# Patient Record
Sex: Male | Born: 1950 | Race: White | Hispanic: No | Marital: Married | Smoking: Never smoker
Health system: Southern US, Community
[De-identification: ages and names within clinical notes are randomized; demographics above are authoritative.]

## PROBLEM LIST (undated history)

## (undated) ENCOUNTER — Emergency Department (HOSPITAL_BASED_OUTPATIENT_CLINIC_OR_DEPARTMENT_OTHER): Admission: EM | Payer: Medicare Other

## (undated) DIAGNOSIS — I700 Atherosclerosis of aorta: Secondary | ICD-10-CM

## (undated) DIAGNOSIS — M1990 Unspecified osteoarthritis, unspecified site: Secondary | ICD-10-CM

## (undated) DIAGNOSIS — I509 Heart failure, unspecified: Secondary | ICD-10-CM

## (undated) DIAGNOSIS — I2510 Atherosclerotic heart disease of native coronary artery without angina pectoris: Secondary | ICD-10-CM

## (undated) DIAGNOSIS — G4730 Sleep apnea, unspecified: Secondary | ICD-10-CM

## (undated) DIAGNOSIS — T7840XA Allergy, unspecified, initial encounter: Secondary | ICD-10-CM

## (undated) DIAGNOSIS — I4891 Unspecified atrial fibrillation: Secondary | ICD-10-CM

## (undated) DIAGNOSIS — K7460 Unspecified cirrhosis of liver: Secondary | ICD-10-CM

## (undated) DIAGNOSIS — J45909 Unspecified asthma, uncomplicated: Secondary | ICD-10-CM

## (undated) DIAGNOSIS — I10 Essential (primary) hypertension: Secondary | ICD-10-CM

## (undated) DIAGNOSIS — J449 Chronic obstructive pulmonary disease, unspecified: Secondary | ICD-10-CM

## (undated) DIAGNOSIS — B958 Unspecified staphylococcus as the cause of diseases classified elsewhere: Secondary | ICD-10-CM

## (undated) DIAGNOSIS — E785 Hyperlipidemia, unspecified: Secondary | ICD-10-CM

## (undated) DIAGNOSIS — E118 Type 2 diabetes mellitus with unspecified complications: Secondary | ICD-10-CM

## (undated) HISTORY — DX: Unspecified cirrhosis of liver: K74.60

## (undated) HISTORY — DX: Unspecified asthma, uncomplicated: J45.909

## (undated) HISTORY — DX: Unspecified atrial fibrillation: I48.91

## (undated) HISTORY — DX: Hyperlipidemia, unspecified: E78.5

## (undated) HISTORY — DX: Chronic obstructive pulmonary disease, unspecified: J44.9

## (undated) HISTORY — DX: Type 2 diabetes mellitus with unspecified complications: E11.8

## (undated) HISTORY — DX: Essential (primary) hypertension: I10

## (undated) HISTORY — PX: EYE SURGERY: SHX253

## (undated) HISTORY — DX: Unspecified osteoarthritis, unspecified site: M19.90

## (undated) HISTORY — DX: Allergy, unspecified, initial encounter: T78.40XA

## (undated) HISTORY — PX: PALATE / UVULA BIOPSY / EXCISION: SUR128

## (undated) HISTORY — DX: Heart failure, unspecified: I50.9

## (undated) HISTORY — DX: Unspecified staphylococcus as the cause of diseases classified elsewhere: B95.8

## (undated) HISTORY — DX: Sleep apnea, unspecified: G47.30

---
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
## 2017-08-14 ENCOUNTER — Ambulatory Visit (INDEPENDENT_AMBULATORY_CARE_PROVIDER_SITE_OTHER): Payer: Medicare Other | Admitting: Family Medicine

## 2017-08-14 ENCOUNTER — Encounter: Payer: Self-pay | Admitting: Family Medicine

## 2017-08-14 VITALS — BP 130/78 | HR 62 | Temp 98.9°F | Resp 16 | Ht 69.0 in | Wt 186.2 lb

## 2017-08-14 DIAGNOSIS — R05 Cough: Secondary | ICD-10-CM | POA: Diagnosis not present

## 2017-08-14 DIAGNOSIS — Z8701 Personal history of pneumonia (recurrent): Secondary | ICD-10-CM | POA: Diagnosis not present

## 2017-08-14 DIAGNOSIS — Z79899 Other long term (current) drug therapy: Secondary | ICD-10-CM

## 2017-08-14 DIAGNOSIS — I4819 Other persistent atrial fibrillation: Secondary | ICD-10-CM

## 2017-08-14 DIAGNOSIS — R0602 Shortness of breath: Secondary | ICD-10-CM | POA: Diagnosis not present

## 2017-08-14 DIAGNOSIS — I509 Heart failure, unspecified: Secondary | ICD-10-CM | POA: Diagnosis not present

## 2017-08-14 DIAGNOSIS — Z7901 Long term (current) use of anticoagulants: Secondary | ICD-10-CM

## 2017-08-14 DIAGNOSIS — I481 Persistent atrial fibrillation: Secondary | ICD-10-CM | POA: Diagnosis not present

## 2017-08-14 DIAGNOSIS — R911 Solitary pulmonary nodule: Secondary | ICD-10-CM | POA: Diagnosis not present

## 2017-08-14 DIAGNOSIS — R058 Other specified cough: Secondary | ICD-10-CM

## 2017-08-14 LAB — COMPREHENSIVE METABOLIC PANEL
AG Ratio: 1.4 (calc) (ref 1.0–2.5)
ALKALINE PHOSPHATASE (APISO): 36 U/L — AB (ref 40–115)
ALT: 32 U/L (ref 9–46)
AST: 29 U/L (ref 10–35)
Albumin: 4.7 g/dL (ref 3.6–5.1)
BILIRUBIN TOTAL: 0.8 mg/dL (ref 0.2–1.2)
BUN/Creatinine Ratio: 15 (calc) (ref 6–22)
BUN: 20 mg/dL (ref 7–25)
CALCIUM: 10.1 mg/dL (ref 8.6–10.3)
CO2: 34 mmol/L — ABNORMAL HIGH (ref 20–32)
CREATININE: 1.36 mg/dL — AB (ref 0.70–1.25)
Chloride: 98 mmol/L (ref 98–110)
Globulin: 3.3 g/dL (calc) (ref 1.9–3.7)
Glucose, Bld: 190 mg/dL — ABNORMAL HIGH (ref 65–99)
Potassium: 4.6 mmol/L (ref 3.5–5.3)
SODIUM: 139 mmol/L (ref 135–146)
TOTAL PROTEIN: 8 g/dL (ref 6.1–8.1)

## 2017-08-14 LAB — CBC WITH DIFFERENTIAL/PLATELET
BASOS ABS: 38 {cells}/uL (ref 0–200)
Basophils Relative: 0.6 %
EOS PCT: 12.6 %
Eosinophils Absolute: 806 cells/uL — ABNORMAL HIGH (ref 15–500)
HEMATOCRIT: 46.9 % (ref 38.5–50.0)
Hemoglobin: 16.3 g/dL (ref 13.2–17.1)
LYMPHS ABS: 1811 {cells}/uL (ref 850–3900)
MCH: 30.4 pg (ref 27.0–33.0)
MCHC: 34.8 g/dL (ref 32.0–36.0)
MCV: 87.5 fL (ref 80.0–100.0)
MONOS PCT: 7.8 %
MPV: 10.2 fL (ref 7.5–12.5)
NEUTROS PCT: 50.7 %
Neutro Abs: 3245 cells/uL (ref 1500–7800)
PLATELETS: 183 10*3/uL (ref 140–400)
RBC: 5.36 10*6/uL (ref 4.20–5.80)
RDW: 12.8 % (ref 11.0–15.0)
TOTAL LYMPHOCYTE: 28.3 %
WBC mixed population: 499 cells/uL (ref 200–950)
WBC: 6.4 10*3/uL (ref 3.8–10.8)

## 2017-08-14 MED ORDER — BUDESONIDE-FORMOTEROL FUMARATE 80-4.5 MCG/ACT IN AERO: 2.0000 | INHALATION_SPRAY | Freq: Two times a day (BID) | RESPIRATORY_TRACT | 3 refills | Status: DC

## 2017-08-14 MED ORDER — LEVOFLOXACIN 500 MG PO TABS: 500.0000 mg | ORAL_TABLET | Freq: Every day | ORAL | 0 refills | Status: DC

## 2017-08-14 NOTE — Patient Instructions (Signed)
Start the antibiotic and symbicort as prescribed.   Take 5 mg of warfarin daily instead of alternating dose and return Friday for a lab visit for a PT/INR. As discussed, the antibiotic may increase your risk of bleeding with the warfarin and we need to keep a close eye on this.   Get your chest XR tomorrow at [facility].   We will call you with your results.   If you get worse, fever, worsening shortness of breath, chest pain, etc, then go to the emergency department or call 911.

## 2017-08-14 NOTE — Progress Notes (Signed)
Subjective:    Patient ID: Theodore Bush, male    DOB: 1951-03-15, 66 y.o.   MRN: [id number]  HPI Chief Complaint  Patient presents with  . New Patient (Initial Visit)    new pt, having trouble breathing, asthma    He is new to the practice and here visiting family. Here for an acute visit. Complains of a 1 week history of shortness of breath and productive cough,. Reports history of CHF, A-fib, asthma, diabetes type 2 and arthritis/chronic pain.  States he was diagnosed with pneumonia last month in his hometown of [place] and was treated with a course of antibiotics and steroids. He does not know which antibiotic he took. Reports feeling much better after treatment. He thinks he completed the treatment 3 weeks ago. States symptoms never completely resolved. 1 week ago he started back having shortness of breath, wheezing and productive cough. Denies frothy sputum or hemoptysis.  Apparently he was diagnosed with asthma in his teens and has been well controlled on rescue inhaler alone for many years. States he was using his rescue inhaler 2-3 times per week for several months prior to becoming ill last month.  States he has been using proair inhaler every other hour over the past 2 days.  States he has an albuterol nebulizer at home but has not used this.   Denies fever, chills, dizziness, rhinorrhea, nasal congestion, sore throat, chest pain, abdominal pain, N/V/D. Denies LE edema.  States he does not feel like his CHF is flaring up. States he knows what a CHF exacerbation feels like.   Reports being diagnosed with A-fib 2-3 years ago. Has been on chronic anticoagulation. Reports alternating 5 mg and 7.5 mg doses of warfarin. States he has been on this regimen for 2-3 years.   History of chronic pain and sees a pain specialist in [place].   Psoriatic arthritis.  Diabetes type 2.   Plans to relocate here from [place] at some point. 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   Denies alcohol  or drug use. Does not smoke. Chews tobacco.   Reviewed allergies, medications, past medical, surgical, and social history.   Review of Systems Pertinent positives and negatives in the history of present illness.     Objective:   Physical Exam  Constitutional: He is oriented to person, place, and time. He appears well-developed and well-nourished. He does not have a sickly appearance. No distress.  Speaking in complete sentences  HENT:  Mouth/Throat: Uvula is midline, oropharynx is clear and moist and mucous membranes are normal.  Eyes: Conjunctivae and lids are normal. Pupils are equal, round, and reactive to light.  Neck: Full passive range of motion without pain. Neck supple. No JVD present.  Cardiovascular: Normal rate and normal pulses. An irregular rhythm present.  No LE edema   Pulmonary/Chest: Effort normal. No accessory muscle usage. No respiratory distress. He has wheezes. He has rhonchi.  Scattered rhonchi and expiratory wheezes throughout  Normal work of breathing  Lymphadenopathy:    He has no cervical adenopathy.       Right: No supraclavicular adenopathy present.       Left: No supraclavicular adenopathy present.  Neurological: He is alert and oriented to person, place, and time. No cranial nerve deficit or sensory deficit. Gait normal.  Skin: Skin is warm and dry. No rash noted. He is not diaphoretic. No cyanosis. No pallor.  Psychiatric: He has a normal mood and affect. His speech is normal and behavior  is normal. Thought content normal. Cognition and memory are normal.   BP 130/78   Pulse 62   Temp 98.9 F (37.2 C)   Resp 16   Ht 5\' 9"  (1.753 m)   Wt 186 lb 3.2 oz (84.5 kg)   SpO2 95%   BMI 27.50 kg/m       Assessment & Plan:  Shortness of breath - Plan: CBC with Differential/Platelet, Comprehensive metabolic panel, DG Chest 2 View, Brain natriuretic peptide, levofloxacin (LEVAQUIN) 500 MG tablet  Persistent atrial fibrillation (HCC)  Chronic  anticoagulation - Plan: Protime-INR  History of pneumonia - Plan: DG Chest 2 View, levofloxacin (LEVAQUIN) 500 MG tablet  Right lower lobe pulmonary nodule - Plan: DG Chest 2 View  Productive cough - Plan: CBC with Differential/Platelet, Comprehensive metabolic panel, DG Chest 2 View, levofloxacin (LEVAQUIN) 500 MG tablet  Chronic congestive heart failure, unspecified heart failure type (HCC) - Plan: CBC with Differential/Platelet, Comprehensive metabolic panel, DG Chest 2 View, Brain natriuretic peptide  Medication management - Plan: Protime-INR  Discussed that he is not in any distress or danger at this point. Hemodynamically stable and no sign of fluid overload.  Levoquin sent to pharmacy.  History of uncontrolled underlying asthma and no maintenance meds in the past.  symbicort 80/4.5 sample given. He is aware that the goal is to cut back on albuterol inhaler need and use.  Chest XR and labs ordered including CBC, CMP, BNP  We will follow up with results.  Cut back warfarin from alternating doses 7.5mg /5mg  to 5 mg daily starting on 08/14/2017 and follow up on Friday 08/17/2017 for a stat PT/INR. Order is in the computer. Patient is returning to his hometown next week. He will need to be seen by his PCP once he returns.  Spent a minimum of 45 minutes face to face with patient and at least 50% of time was in counseling and coordination of care.  Dr. Redmond School also examined patient and agrees with plan of care.

## 2017-08-15 ENCOUNTER — Encounter: Payer: Self-pay | Admitting: Family Medicine

## 2017-08-15 ENCOUNTER — Ambulatory Visit
Admission: RE | Admit: 2017-08-15 | Discharge: 2017-08-15 | Disposition: A | Discharge status: Home / Self Care | Payer: Medicare Other | Source: Ambulatory Visit | Attending: Family Medicine | Admitting: Family Medicine

## 2017-08-15 DIAGNOSIS — R0602 Shortness of breath: Secondary | ICD-10-CM

## 2017-08-15 DIAGNOSIS — R911 Solitary pulmonary nodule: Secondary | ICD-10-CM

## 2017-08-15 DIAGNOSIS — R05 Cough: Secondary | ICD-10-CM

## 2017-08-15 DIAGNOSIS — R058 Other specified cough: Secondary | ICD-10-CM

## 2017-08-15 DIAGNOSIS — Z8701 Personal history of pneumonia (recurrent): Secondary | ICD-10-CM

## 2017-08-15 DIAGNOSIS — I509 Heart failure, unspecified: Secondary | ICD-10-CM

## 2017-08-15 LAB — BRAIN NATRIURETIC PEPTIDE: Brain Natriuretic Peptide: 96 pg/mL (ref <100)

## 2017-08-17 ENCOUNTER — Ambulatory Visit: Payer: Medicare Other | Admitting: Medical

## 2017-08-17 ENCOUNTER — Telehealth: Payer: Self-pay

## 2017-08-17 DIAGNOSIS — Z79899 Other long term (current) drug therapy: Secondary | ICD-10-CM

## 2017-08-17 DIAGNOSIS — Z7901 Long term (current) use of anticoagulants: Secondary | ICD-10-CM

## 2017-08-17 LAB — PROTIME-INR
INR: 2 — ABNORMAL HIGH
Prothrombin Time: 21.4 s — ABNORMAL HIGH (ref 9.0–11.5)

## 2017-08-17 NOTE — Telephone Encounter (Signed)
Theodore Bush received  Pt /inr back on pt ., per Theodore Bush pt is to stay on the same dose.  5mg   Pt was notified of this .

## 2017-09-03 ENCOUNTER — Telehealth: Payer: Self-pay

## 2017-09-03 MED ORDER — BUDESONIDE-FORMOTEROL FUMARATE 80-4.5 MCG/ACT IN AERO: 2.0000 | INHALATION_SPRAY | Freq: Two times a day (BID) | RESPIRATORY_TRACT | 2 refills | Status: DC

## 2017-09-03 NOTE — Telephone Encounter (Signed)
Pt called requesting Symbicort refills to be transfer from [facility] in [place] to [place] in [place]. Symbicort called to [facility]. Victorino December

## 2017-09-18 DIAGNOSIS — J189 Pneumonia, unspecified organism: Secondary | ICD-10-CM

## 2017-09-18 HISTORY — DX: Pneumonia, unspecified organism: J18.9

## 2018-03-07 ENCOUNTER — Encounter: Payer: Self-pay | Admitting: Family Medicine

## 2018-03-07 ENCOUNTER — Ambulatory Visit (INDEPENDENT_AMBULATORY_CARE_PROVIDER_SITE_OTHER): Payer: Medicare Other | Admitting: Family Medicine

## 2018-03-07 VITALS — BP 102/68 | HR 41 | Temp 97.7°F | Resp 18 | Ht 70.0 in | Wt 190.6 lb

## 2018-03-07 DIAGNOSIS — Z7901 Long term (current) use of anticoagulants: Secondary | ICD-10-CM | POA: Diagnosis not present

## 2018-03-07 DIAGNOSIS — R053 Chronic cough: Secondary | ICD-10-CM

## 2018-03-07 DIAGNOSIS — G4700 Insomnia, unspecified: Secondary | ICD-10-CM | POA: Diagnosis not present

## 2018-03-07 DIAGNOSIS — Z79899 Other long term (current) drug therapy: Secondary | ICD-10-CM

## 2018-03-07 DIAGNOSIS — R05 Cough: Secondary | ICD-10-CM | POA: Diagnosis not present

## 2018-03-07 DIAGNOSIS — R413 Other amnesia: Secondary | ICD-10-CM | POA: Diagnosis not present

## 2018-03-07 DIAGNOSIS — I481 Persistent atrial fibrillation: Secondary | ICD-10-CM | POA: Diagnosis not present

## 2018-03-07 DIAGNOSIS — I4819 Other persistent atrial fibrillation: Secondary | ICD-10-CM

## 2018-03-07 MED ORDER — RIVAROXABAN 20 MG PO TABS: 20.0000 mg | ORAL_TABLET | Freq: Every day | ORAL | 3 refills | Status: DC

## 2018-03-07 NOTE — Patient Instructions (Signed)

## 2018-03-07 NOTE — Progress Notes (Signed)
   Subjective:    Patient ID: Norma Ignasiak, male    DOB: 1951/08/02, 67 y.o.   MRN: [id number]  HPI He is here for multiple issues.  He was originally scheduled as an acute visit for cough.  He has a long history of difficulty with cough mainly in the morning.  He presently does not smoke however does use marijuana mainly at night to help with sleep.  He is a retired Building control surveyor.  He gives a history of having chest x-ray as well as MRI scan on his chest within the last several years.  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 He also has had difficulty with his sleep and has gotten used to the idea of using marijuana to help relax him to sleep.  There are also questions about his memory.  He has no particular concerns about it but apparently other people in his family have.   He is in the process of moving to this area from [place].  He does have a physician there. He has a long history of atrial fibrillation and is presently on Coumadin.  He does find that that problematic.  Review of Systems     Objective:   Physical Exam Alert and in no distress. MMSE 30       Assessment & Plan:  Chronic anticoagulation - Plan: rivaroxaban (XARELTO) 20 MG TABS tablet  Persistent atrial fibrillation (HCC) - Plan: rivaroxaban (XARELTO) 20 MG TABS tablet  Medication management  Insomnia, unspecified type  Chronic cough  Memory difficulties I discussed the fact that the memory issues do not seem to be a major player at the present time.  Discussed the atrial fib with him and encouraged him to switch to Xarelto which he was comfortable with. I then spent time talking to him about insomnia.  Did give him information concerning that and encouraged him to stay away from marijuana as much as possible. He has other health related issues in regard to his diabetes as well as psoriatic arthritis.  Recommend that when he comes back into this  area to live permanently, we can readdress these issues. Over 45 minutes, greater than 50% spent in counseling and coordination of care.

## 2018-03-27 ENCOUNTER — Telehealth: Payer: Self-pay | Admitting: Family Medicine

## 2018-03-27 NOTE — Telephone Encounter (Signed)
Received records from pt VIA mail. Sending back for review.

## 2018-09-02 ENCOUNTER — Encounter: Payer: Self-pay | Admitting: Family Medicine

## 2018-09-02 ENCOUNTER — Ambulatory Visit (INDEPENDENT_AMBULATORY_CARE_PROVIDER_SITE_OTHER): Payer: Medicare Other | Admitting: Family Medicine

## 2018-09-02 ENCOUNTER — Encounter: Payer: Medicare Other | Admitting: Family Medicine

## 2018-09-02 VITALS — BP 122/62 | HR 51 | Wt 194.4 lb

## 2018-09-02 DIAGNOSIS — Z111 Encounter for screening for respiratory tuberculosis: Secondary | ICD-10-CM

## 2018-09-02 DIAGNOSIS — M069 Rheumatoid arthritis, unspecified: Secondary | ICD-10-CM

## 2018-09-02 DIAGNOSIS — I4891 Unspecified atrial fibrillation: Secondary | ICD-10-CM | POA: Diagnosis not present

## 2018-09-02 DIAGNOSIS — J4530 Mild persistent asthma, uncomplicated: Secondary | ICD-10-CM | POA: Insufficient documentation

## 2018-09-02 DIAGNOSIS — Z7901 Long term (current) use of anticoagulants: Secondary | ICD-10-CM | POA: Insufficient documentation

## 2018-09-02 DIAGNOSIS — Z794 Long term (current) use of insulin: Secondary | ICD-10-CM | POA: Insufficient documentation

## 2018-09-02 DIAGNOSIS — E118 Type 2 diabetes mellitus with unspecified complications: Secondary | ICD-10-CM

## 2018-09-02 DIAGNOSIS — I509 Heart failure, unspecified: Secondary | ICD-10-CM | POA: Insufficient documentation

## 2018-09-02 HISTORY — DX: Type 2 diabetes mellitus with unspecified complications: E11.8

## 2018-09-02 HISTORY — DX: Unspecified atrial fibrillation: I48.91

## 2018-09-02 NOTE — Progress Notes (Signed)
   Subjective:    Patient ID: Theodore Bush, male    DOB: [birth year], 67 y.o.   MRN: [id number]  HPI Chief Complaint  Patient presents with  . med check    med check wants TB skin test for humira-, dixgoin   He is here requesting a TB test that he reports he gets annually since being on Humira for psoriatic arthritis.  States his rheumatologist in [place] typically does this.  States he is in town visiting his children and grandchildren.  He currently still lives in [place] but states he hopes to move here once he sells his house. He has several providers managing his care in [place].  Dr. Assunta Curtis is his PCP in [facility] is managing his health. He saw him the end of October. States he does not need refills.   Diabetes is being managed by endocrinology. States his last A1c had improved but still above 7%. No issues with medications.   He also reports having a pain management specialist, rheumatologist. Has not followed up with a cardiologist in the past couple of years but plans to establish with one when moving here.   States he is doing well on Xarelto for Afib. No sign of bleeding.   Denies fever, chills, dizziness, chest pain, palpitations, shortness of breath, abdominal pain, N/V/D, urinary symptoms, LE edema.   Reviewed allergies, medications, past medical, surgical, family, and social history.    Review of Systems Pertinent positives and negatives in the history of present illness.     Objective:   Physical Exam BP 122/62   Pulse (!) 51   Wt 194 lb 6.4 oz (88.2 kg)   BMI 27.89 kg/m   Alert and oriented and in no acute distress.  Normal work of breathing.  Skin is warm and dry.  Speech, mood normal.      Assessment & Plan:  Screening-pulmonary TB - Plan: QuantiFERON-TB Gold Plus  Controlled diabetes mellitus type 2 with complications, unspecified whether long term insulin use (HCC)  Atrial fibrillation, unspecified type (HCC)  Rheumatoid  arthritis, involving unspecified site, unspecified rheumatoid factor presence (HCC)  Chronic anticoagulation  Chronic congestive heart failure, unspecified heart failure type ([place])  Mild persistent asthma without complication  He is currently visiting his children and grandchildren in town and here for a TB screening test.  States he gets this annually since being on Humira.  Typically this is done by his rheumatologist in [place]. He has a complex medical health history.  He is on several high-risk medications.  Reports good compliance with medications and with his health care visits to multiple providers in [place]. He is not in any distress.  Discussed that once he moves here that I will recommend that he establish with a cardiologist, rheumatologist and potentially an endocrinologist as well.  He questions whether the rheumatologist will prescribe his chronic pain medication and I did recommend that he also establish with a pain management specialist once moving here.  I advised him to continue seeing his providers in [place] as scheduled and requesting that they refill his medications prior to the move. Dr. Crisoforo Oxford is his rheumatologist in [facility]. Requests that his TB- Quant Gold result be sent to her.  Advised him to fill out a medical release so that I can forward the results to his provider. Currently no follow-up recommended

## 2018-09-02 NOTE — Patient Instructions (Addendum)
[facility] Address: [address], [place], [place] [address] Phone: [phone]   [facility] Phone: [phone] Address: [address] [place], [place]

## 2018-09-06 ENCOUNTER — Telehealth: Payer: Self-pay | Admitting: Family Medicine

## 2018-09-06 NOTE — Telephone Encounter (Signed)
Requested records received from Dr. Sophronia Simas. Sending back for review.

## 2018-09-09 LAB — QUANTIFERON-TB GOLD PLUS
QUANTIFERON NIL VALUE: 0.05 [IU]/mL
QuantiFERON Mitogen Value: 4.16 IU/mL
QuantiFERON TB1 Ag Value: 0.05 IU/mL
QuantiFERON TB2 Ag Value: 0.05 IU/mL
QuantiFERON-TB Gold Plus: NEGATIVE

## 2018-10-22 DIAGNOSIS — I10 Essential (primary) hypertension: Secondary | ICD-10-CM | POA: Insufficient documentation

## 2018-11-21 ENCOUNTER — Other Ambulatory Visit: Payer: Self-pay | Admitting: Family Medicine

## 2018-11-21 DIAGNOSIS — I4819 Other persistent atrial fibrillation: Secondary | ICD-10-CM

## 2018-11-21 DIAGNOSIS — Z7901 Long term (current) use of anticoagulants: Secondary | ICD-10-CM

## 2018-11-21 NOTE — Telephone Encounter (Signed)
Is this okay to refill? 

## 2019-01-28 ENCOUNTER — Other Ambulatory Visit: Payer: Self-pay

## 2019-01-28 ENCOUNTER — Encounter: Payer: Self-pay | Admitting: Medical

## 2019-01-28 ENCOUNTER — Ambulatory Visit (INDEPENDENT_AMBULATORY_CARE_PROVIDER_SITE_OTHER): Payer: Medicare Other | Admitting: Medical

## 2019-01-28 VITALS — Temp 98.3°F | Ht 69.0 in | Wt 200.0 lb

## 2019-01-28 DIAGNOSIS — E118 Type 2 diabetes mellitus with unspecified complications: Secondary | ICD-10-CM

## 2019-01-28 DIAGNOSIS — M069 Rheumatoid arthritis, unspecified: Secondary | ICD-10-CM | POA: Diagnosis not present

## 2019-01-28 DIAGNOSIS — J4540 Moderate persistent asthma, uncomplicated: Secondary | ICD-10-CM | POA: Diagnosis not present

## 2019-01-28 DIAGNOSIS — I4891 Unspecified atrial fibrillation: Secondary | ICD-10-CM

## 2019-01-28 DIAGNOSIS — I10 Essential (primary) hypertension: Secondary | ICD-10-CM

## 2019-01-28 DIAGNOSIS — E782 Mixed hyperlipidemia: Secondary | ICD-10-CM

## 2019-01-28 DIAGNOSIS — Z7901 Long term (current) use of anticoagulants: Secondary | ICD-10-CM

## 2019-01-28 MED ORDER — BUDESONIDE-FORMOTEROL FUMARATE 160-4.5 MCG/ACT IN AERO: 2.0000 | INHALATION_SPRAY | Freq: Two times a day (BID) | RESPIRATORY_TRACT | 2 refills | Status: DC

## 2019-01-28 MED ORDER — ALBUTEROL SULFATE HFA 108 (90 BASE) MCG/ACT IN AERS: 1.0000 | INHALATION_SPRAY | Freq: Four times a day (QID) | RESPIRATORY_TRACT | 1 refills | Status: DC | PRN

## 2019-01-28 NOTE — Progress Notes (Signed)
Sent referal to [facility] and pt is scheduled August

## 2019-01-28 NOTE — Progress Notes (Signed)
Subjective:     Patient ID: Keyondre Hepburn, male   DOB: 10/12/1950, 68 y.o.   MRN: [id number]  This visit type was conducted due to national recommendations for restrictions regarding the COVID-19 Pandemic (e.g. social distancing) in an effort to limit this patient's exposure and mitigate transmission in our community.  Due to their co-morbid illnesses, this patient is at least at moderate risk for complications without adequate follow up.  This format is felt to be most appropriate for this patient at this time.    Documentation for virtual audio and video telecommunications through Zoom encounter:  The patient was located at home. The provider was located in the office. The patient did consent to this visit and is aware of possible charges through their insurance for this visit.  The other persons participating in this telemedicine service were none. Time spent on call was 15 minutes and in review of previous records >20 minutes total.  This virtual service is not related to other E/M service within previous 7 days.   HPI Chief Complaint  Patient presents with  . breathing issues    breathing issues,  needs symbicort refill    Virtual visit today for medication concern.  He just moved up from [place] to live here with his son.  He is currently staying with his son until they find a place to live.  He apparently did not have all of his med list the last time he called in so he wants to make sure we have the accurate medications on file.  He is a diabetic and just saw his diabetic doctor in [place] recently and had a hemoglobin A1c of 6.8%.  His blood sugars range 90-120 fasting.  He would like to get off the medication  He needs a referral to a rheumatologist here in [place].  He has rheumatoid arthritis and his rheumatologist in [place] was prescribing his Norco and Humira  Asthma-his main concern today is he is out of his Symbicort.  He needs that refilled and he is on his last  albuterol inhaler.  Otherwise has been in his usual state of health  Review of Systems As in subjective    Objective:   Physical Exam  Temp 98.3 F (36.8 C) (Oral)   Ht 5\' 9"  (1.753 m)   Wt 200 lb (90.7 kg)   BMI 29.53 kg/m   Due to coronavirus pandemic stay at home measures, patient visit was virtual and they were not examined in person.   General: Well-developed, well-nourished, no acute distress     Assessment:     Encounter Diagnoses  Name Primary?  . Moderate persistent asthma, unspecified whether complicated Yes  . Atrial fibrillation, unspecified type ([place])   . Controlled diabetes mellitus type 2 with complications, unspecified whether long term insulin use ([place])   . Rheumatoid arthritis, involving unspecified site, unspecified rheumatoid factor presence ([place])   . Chronic anticoagulation   . Mixed dyslipidemia   . Hypertension, unspecified type        Plan:     Asthma-refilled Symbicort and albuterol, discussed role of preventative and rescue medication, and proper use  Rheumatoid arthritis-we will start a referral to rheumatology  Diabetes- we discussed that he may be able to narrow down 1 medication by combining Jentadueto and metformin into Jentadueto only but at a higher dose.  He wants to wait and come back in 3 months for med check since he just got this refilled  Otherwise continue current medications in general.  I spent a lot of time today updating his medical record is several medicines were not listed in his chart, and he had all his pill bottles in front of him today    Ryan was seen today for breathing issues.  Diagnoses and all orders for this visit:  Moderate persistent asthma, unspecified whether complicated  Atrial fibrillation, unspecified type ([place])  Controlled diabetes mellitus type 2 with complications, unspecified whether long term insulin use (HCC)  Rheumatoid arthritis, involving unspecified site, unspecified rheumatoid factor  presence ([place]) -     Ambulatory referral to Rheumatology  Chronic anticoagulation  Mixed dyslipidemia  Hypertension, unspecified type  Other orders -     albuterol (PROAIR HFA) 108 (90 Base) MCG/ACT inhaler; Inhale 1 puff into the lungs every 6 (six) hours as needed for wheezing or shortness of breath. -     budesonide-formoterol (SYMBICORT) 160-4.5 MCG/ACT inhaler; Inhale 2 puffs into the lungs 2 (two) times daily.

## 2019-04-15 ENCOUNTER — Other Ambulatory Visit: Payer: Self-pay | Admitting: Medical

## 2019-04-24 ENCOUNTER — Encounter: Payer: Self-pay | Admitting: Family Medicine

## 2019-05-01 ENCOUNTER — Encounter: Payer: Medicare Other | Admitting: Family Medicine

## 2019-05-27 ENCOUNTER — Other Ambulatory Visit: Payer: Self-pay | Admitting: Medical

## 2019-05-27 NOTE — Telephone Encounter (Signed)
Ok to refill for 30 days but he needs a med check with me. He last saw Audelia Acton but this was virtual.

## 2019-05-27 NOTE — Telephone Encounter (Signed)
Pt already has an appt

## 2019-06-11 ENCOUNTER — Ambulatory Visit (INDEPENDENT_AMBULATORY_CARE_PROVIDER_SITE_OTHER): Payer: Medicare Other | Admitting: Family Medicine

## 2019-06-11 ENCOUNTER — Ambulatory Visit
Admission: RE | Admit: 2019-06-11 | Discharge: 2019-06-11 | Disposition: A | Discharge status: Home / Self Care | Payer: Medicare Other | Source: Ambulatory Visit | Attending: Family Medicine | Admitting: Family Medicine

## 2019-06-11 ENCOUNTER — Encounter: Payer: Self-pay | Admitting: Family Medicine

## 2019-06-11 ENCOUNTER — Other Ambulatory Visit: Payer: Self-pay

## 2019-06-11 VITALS — BP 122/78 | HR 82 | Temp 97.4°F | Ht 69.0 in | Wt 213.0 lb

## 2019-06-11 DIAGNOSIS — I700 Atherosclerosis of aorta: Secondary | ICD-10-CM | POA: Insufficient documentation

## 2019-06-11 DIAGNOSIS — Z7901 Long term (current) use of anticoagulants: Secondary | ICD-10-CM | POA: Diagnosis not present

## 2019-06-11 DIAGNOSIS — R0602 Shortness of breath: Secondary | ICD-10-CM

## 2019-06-11 DIAGNOSIS — I2584 Coronary atherosclerosis due to calcified coronary lesion: Secondary | ICD-10-CM

## 2019-06-11 DIAGNOSIS — I2510 Atherosclerotic heart disease of native coronary artery without angina pectoris: Secondary | ICD-10-CM

## 2019-06-11 DIAGNOSIS — R058 Other specified cough: Secondary | ICD-10-CM

## 2019-06-11 DIAGNOSIS — I4891 Unspecified atrial fibrillation: Secondary | ICD-10-CM | POA: Diagnosis not present

## 2019-06-11 DIAGNOSIS — R05 Cough: Secondary | ICD-10-CM

## 2019-06-11 DIAGNOSIS — I509 Heart failure, unspecified: Secondary | ICD-10-CM | POA: Diagnosis not present

## 2019-06-11 DIAGNOSIS — E118 Type 2 diabetes mellitus with unspecified complications: Secondary | ICD-10-CM | POA: Diagnosis not present

## 2019-06-11 DIAGNOSIS — R9431 Abnormal electrocardiogram [ECG] [EKG]: Secondary | ICD-10-CM | POA: Insufficient documentation

## 2019-06-11 DIAGNOSIS — G4700 Insomnia, unspecified: Secondary | ICD-10-CM

## 2019-06-11 DIAGNOSIS — Z794 Long term (current) use of insulin: Secondary | ICD-10-CM | POA: Diagnosis not present

## 2019-06-11 DIAGNOSIS — J4540 Moderate persistent asthma, uncomplicated: Secondary | ICD-10-CM

## 2019-06-11 DIAGNOSIS — M069 Rheumatoid arthritis, unspecified: Secondary | ICD-10-CM

## 2019-06-11 DIAGNOSIS — R413 Other amnesia: Secondary | ICD-10-CM

## 2019-06-11 DIAGNOSIS — Z23 Encounter for immunization: Secondary | ICD-10-CM | POA: Diagnosis not present

## 2019-06-11 DIAGNOSIS — N189 Chronic kidney disease, unspecified: Secondary | ICD-10-CM | POA: Insufficient documentation

## 2019-06-11 DIAGNOSIS — R062 Wheezing: Secondary | ICD-10-CM

## 2019-06-11 DIAGNOSIS — E782 Mixed hyperlipidemia: Secondary | ICD-10-CM

## 2019-06-11 DIAGNOSIS — R5383 Other fatigue: Secondary | ICD-10-CM

## 2019-06-11 DIAGNOSIS — I10 Essential (primary) hypertension: Secondary | ICD-10-CM

## 2019-06-11 HISTORY — DX: Atherosclerotic heart disease of native coronary artery without angina pectoris: I25.10

## 2019-06-11 HISTORY — DX: Chronic kidney disease, unspecified: N18.9

## 2019-06-11 HISTORY — DX: Insomnia, unspecified: G47.00

## 2019-06-11 HISTORY — DX: Atherosclerosis of aorta: I70.0

## 2019-06-11 LAB — POCT GLYCOSYLATED HEMOGLOBIN (HGB A1C): Hemoglobin A1C: 6.6 % — AB (ref 4.0–5.6)

## 2019-06-11 NOTE — Progress Notes (Signed)
Subjective:    Patient ID: Theodore Bush, male    DOB: July 10, 1951, 68 y.o.   MRN: [id number]  HPI Chief Complaint  Patient presents with  . med check    med check, does check DM-checked once a day. flu shot given   He is fairly new to me. Here today for a medication management visit.  Moved here from [place] to live near his son.  He and his [place]   History of DM, HTN, HL, OSA, CHF, asthma, A-fib on Xarelto, CKD, RA.   Previous visit in our office was in May 2020 with Dorothea Ogle, PA for refills of Symbicort and albuterol.   Prior to that he was seen here by Dr. Redmond School in June 2019 and his anticoagulant was changed from warfarin to Xarelto. Reports taking this daily without any issues. Denies bleeding.  Chronic atrial fib and CHF. Reports this was diagnosed in [place] in the ED.   Concerns today regarding shortness of breath and chronic cough. Denies fever, chills, dizziness, chest pain, palpitations, abdominal pain, N/V/D, urinary symptoms, LE edema.  No orthopnea or PND.   Diabetes- diagnosed in his 76s. Checks BS daily. Readings are consistent with A1c. No low readings.  Previous Hgb A1c 6.8% in [place] more than 3 months ago.  His current medication regimen consists of Metformin 1,000 mg daily with breakfast.  Jentadueto- twice daily  Lantus 40 units daily   Reports diet has been poor since Covid-19 pandemic. States he is eating more sweets and carbohydrates. Is not active.   Reports taking Lipitor 80 mg and fenofibrate 50 mg daily without any side effects. This was started by his PCP in [place].   HTN- BP at home are in goal range per patient. He is taking Coreg 12.5 mg, furosemide 20 mg, lisinopril 10 mg, spironolactone 25 mg. This was started in [place] by his PCP.   RA- this is managed by Dr. Leigh Aurora.  Has been taking Norco and Humira prescribed by rheumatologist in [place].  States Dr. Amil Amen cut his hydrocodone  back to 60 tablets per month instead of 120 tablets per month.   Asthma- using Symbicort twice daily. Uses albuterol 6-8 times per week.  Reports being short of breath, wheezing. States he is not as active.  Still smoking marijuana every evening. Denies ever smoking cigarettes.  History of pneumonia last year.   He saw allergy and asthma specialist in the 1980s. No pulmonologist   Having issues with sleeping. Waking up after a couple of hours.  Taking a sleep aid.   History of sleep apnea. Had surgery to "trim uvula". Never used a CPAP.  Is not snoring now.  No orthopnea or PND.   States he forgets things sometimes. This is not new and not worsening per patient,.   He used to drink alcohol "heavily".  Now he drinks 2 vodka and OJs in the evenings.   He chews tobacco.   Reviewed allergies, medications, past medical, surgical, family, and social history.   Review of Systems Pertinent positives and negatives in the history of present illness.     Objective:   Physical Exam Constitutional:      General: He is not in acute distress.    Appearance: He is not ill-appearing.  Eyes:     Conjunctiva/sclera: Conjunctivae normal.     Pupils: Pupils are equal, round, and reactive to light.  Neck:     Musculoskeletal: Normal range of  motion and neck supple.  Cardiovascular:     Rate and Rhythm: Normal rate and regular rhythm.     Pulses: Normal pulses.     Heart sounds: Normal heart sounds.  Pulmonary:     Effort: Pulmonary effort is normal.     Breath sounds: Wheezing present.  Musculoskeletal: Normal range of motion.     Right lower leg: No edema.     Left lower leg: No edema.  Skin:    General: Skin is warm and dry.     Capillary Refill: Capillary refill takes less than 2 seconds.  Neurological:     General: No focal deficit present.     Mental Status: He is alert and oriented to person, place, and time.     Cranial Nerves: No cranial nerve deficit.     Motor: No  weakness.     Gait: Gait normal.    BP 122/78   Pulse 82   Temp (!) 97.4 F (36.3 C)   Ht 5\' 9"  (1.753 m)   Wt 213 lb (96.6 kg)   SpO2 98%   BMI 31.45 kg/m      Assessment & Plan:  Controlled type 2 diabetes mellitus with complication, with long-term current use of insulin ([place]) - Plan: HgB A1c, CBC with Differential/Platelet, Comprehensive metabolic panel, TSH, T4, free  Needs flu shot - Plan: Flu Vaccine QUAD High Dose(Fluad)  Atrial fibrillation, unspecified type ([place]) - Plan: Ambulatory referral to Cardiology  Chronic anticoagulation  Chronic congestive heart failure, unspecified heart failure type ([place]) - Plan: EKG 12-Lead, Brain natriuretic peptide, DG Chest 2 View, Ambulatory referral to Cardiology  Moderate persistent asthma, unspecified whether complicated - Plan: DG Chest 2 View  Mixed dyslipidemia - Plan: Lipid panel  Hypertension, unspecified type  Rheumatoid arthritis, involving unspecified site, unspecified rheumatoid factor presence (HCC)  Insomnia, unspecified type  Shortness of breath - Plan: EKG 12-Lead, DG Chest 2 View  Wheezing - Plan: DG Chest 2 View  Productive cough - Plan: DG Chest 2 View  Fatigue, unspecified type - Plan: CBC with Differential/Platelet, Comprehensive metabolic panel, TSH, T4, free, Vitamin B12  Memory difficulties - Plan: Vitamin B12  Chronic kidney disease, unspecified CKD stage  Abnormal EKG - Plan: Ambulatory referral to Cardiology  Coronary artery calcification - Plan: Ambulatory referral to Cardiology  Aortic arch atherosclerosis ([place]) - Plan: Ambulatory referral to Cardiology  ECG shows sinus rhythm with short PR. No acute ST changes.  Rate 84 PR interval 142ms QRS 143ms Poor R wave progession Read by myself and Dr. Redmond School.  Reports being diagnosed with A-fib and CHF while hospitalized in [place] and not having seen a cardiologist in the past.  He is on Xarelto and seems to be doing fine.   Appears to be  euvolemic. No sign of ACS or CHF exacerbation.  Suspect dyspnea related to asthma but cannot rule out underlying HF.  Will send him for a chest XR and refer to cardiology.   Asthma- no acute distress but he is wheezing. Cough is in the morning and this is not new.  Offered oral steroids and he declines. Continue on Symbicort twice daily. Use albuterol as needed. Send for chest XR.  Discussed referral to pulmonology. He declines.  Recommend he cut back or stop marijuana.   Diabetes is well controlled with Hgb A1c 6.6%. recommend he continue on current medication regimen and keep an eye on his BS. No sign of hypoglycemia or hyperglycemia.  HTN also controlled. Doing well  on current medication regimen. Continue. Discussed low sodium diet to help control this and HF.   HL- continue on statin.   RA- managed by Dr. Amil Amen. Get records.   Insomnia- counseling on good sleep hygiene and avoiding daytime naps.   Chronic memory issues- unchanged. Does not want to pursue this today. Will follow up at next visit. Declines MMSE  Follow up pending results.   Spent at least 45 minutes face to face with patient and more than 50% was in counseling and coordination of care.

## 2019-06-12 LAB — COMPREHENSIVE METABOLIC PANEL
ALT: 23 IU/L (ref 0–44)
AST: 28 IU/L (ref 0–40)
Albumin/Globulin Ratio: 1.7 (ref 1.2–2.2)
Albumin: 4.5 g/dL (ref 3.8–4.8)
Alkaline Phosphatase: 38 IU/L — ABNORMAL LOW (ref 39–117)
BUN/Creatinine Ratio: 25 — ABNORMAL HIGH (ref 10–24)
BUN: 34 mg/dL — ABNORMAL HIGH (ref 8–27)
Bilirubin Total: 0.8 mg/dL (ref 0.0–1.2)
CO2: 29 mmol/L (ref 20–29)
Calcium: 10 mg/dL (ref 8.6–10.2)
Chloride: 99 mmol/L (ref 96–106)
Creatinine, Ser: 1.37 mg/dL — ABNORMAL HIGH (ref 0.76–1.27)
GFR calc Af Amer: 61 mL/min/{1.73_m2} (ref >59)
GFR calc non Af Amer: 53 mL/min/{1.73_m2} — ABNORMAL LOW (ref >59)
Globulin, Total: 2.7 g/dL (ref 1.5–4.5)
Glucose: 99 mg/dL (ref 65–99)
Potassium: 4.7 mmol/L (ref 3.5–5.2)
Sodium: 143 mmol/L (ref 134–144)
Total Protein: 7.2 g/dL (ref 6.0–8.5)

## 2019-06-12 LAB — LIPID PANEL
Chol/HDL Ratio: 2.8 ratio (ref 0.0–5.0)
Cholesterol, Total: 133 mg/dL (ref 100–199)
HDL: 48 mg/dL (ref >39)
LDL Chol Calc (NIH): 65 mg/dL (ref 0–99)
Triglycerides: 107 mg/dL (ref 0–149)
VLDL Cholesterol Cal: 20 mg/dL (ref 5–40)

## 2019-06-12 LAB — CBC WITH DIFFERENTIAL/PLATELET
Basophils Absolute: 0 10*3/uL (ref 0.0–0.2)
Basos: 1 %
EOS (ABSOLUTE): 0.5 10*3/uL — ABNORMAL HIGH (ref 0.0–0.4)
Eos: 9 %
Hematocrit: 44.6 % (ref 37.5–51.0)
Hemoglobin: 15.3 g/dL (ref 13.0–17.7)
Immature Grans (Abs): 0 10*3/uL (ref 0.0–0.1)
Immature Granulocytes: 1 %
Lymphocytes Absolute: 2.3 10*3/uL (ref 0.7–3.1)
Lymphs: 40 %
MCH: 30.9 pg (ref 26.6–33.0)
MCHC: 34.3 g/dL (ref 31.5–35.7)
MCV: 90 fL (ref 79–97)
Monocytes Absolute: 0.5 10*3/uL (ref 0.1–0.9)
Monocytes: 8 %
Neutrophils Absolute: 2.3 10*3/uL (ref 1.4–7.0)
Neutrophils: 41 %
Platelets: 159 10*3/uL (ref 150–450)
RBC: 4.95 x10E6/uL (ref 4.14–5.80)
RDW: 13.6 % (ref 11.6–15.4)
WBC: 5.6 10*3/uL (ref 3.4–10.8)

## 2019-06-12 LAB — BRAIN NATRIURETIC PEPTIDE: BNP: 223.4 pg/mL — ABNORMAL HIGH (ref 0.0–100.0)

## 2019-06-12 LAB — T4, FREE: Free T4: 1.36 ng/dL (ref 0.82–1.77)

## 2019-06-12 LAB — TSH: TSH: 2.06 u[IU]/mL (ref 0.450–4.500)

## 2019-06-13 LAB — SPECIMEN STATUS REPORT

## 2019-06-13 LAB — VITAMIN B12: Vitamin B-12: 321 pg/mL (ref 232–1245)

## 2019-06-19 ENCOUNTER — Ambulatory Visit (INDEPENDENT_AMBULATORY_CARE_PROVIDER_SITE_OTHER): Payer: Medicare Other | Admitting: Cardiology

## 2019-06-19 ENCOUNTER — Encounter: Payer: Self-pay | Admitting: Cardiology

## 2019-06-19 ENCOUNTER — Other Ambulatory Visit: Payer: Self-pay

## 2019-06-19 VITALS — BP 143/98 | HR 84 | Temp 96.3°F | Ht 69.0 in | Wt 212.4 lb

## 2019-06-19 DIAGNOSIS — I480 Paroxysmal atrial fibrillation: Secondary | ICD-10-CM | POA: Diagnosis not present

## 2019-06-19 DIAGNOSIS — E7800 Pure hypercholesterolemia, unspecified: Secondary | ICD-10-CM

## 2019-06-19 DIAGNOSIS — I2510 Atherosclerotic heart disease of native coronary artery without angina pectoris: Secondary | ICD-10-CM

## 2019-06-19 DIAGNOSIS — I10 Essential (primary) hypertension: Secondary | ICD-10-CM | POA: Diagnosis not present

## 2019-06-19 DIAGNOSIS — I509 Heart failure, unspecified: Secondary | ICD-10-CM

## 2019-06-19 NOTE — Patient Instructions (Signed)
Medication Instructions:  Continue same medications   Lab work: None ordered   Testing/Procedures: Schedule Echo  Follow-Up: At [facility], you and your health needs are our priority.  As part of our continuing mission to provide you with exceptional heart care, we have created designated Provider Care Teams.  These Care Teams include your primary Cardiologist (physician) and Advanced Practice Providers (APPs -  Physician Assistants and Nurse Practitioners) who all work together to provide you with the care you need, when you need it. Schedule follow up appointment 4 to 6 weeks

## 2019-06-19 NOTE — Progress Notes (Signed)
Cardiology Office Note   Date:  06/19/2019   ID:  Theodore Bush, DOB [birth year], MRN [id number]  PCP:  Girtha Rm, NP-C  Cardiologist:   Cecily Lawhorne Martinique, MD   Chief Complaint  Patient presents with  . Congestive Heart Failure  . Atrial Fibrillation      History of Present Illness: Theodore Bush is a 68 y.o. male who is seen at the request of Harland Dingwall PA-C for evaluation of CHF and history of Afib. Patient moved to [facility] a couple of years ago to be closer to family. Moved from [place]. He has a history of atrial fibrillation, CHF and CAD. He reports he has been admitted a couple of times for PNA and at one time had "fluid in the lungs" and required thoracentesis. He has a difficult time remembering when he was hospitalized or any details about prior cardiac work up. States he did have a stress test at some point in the past. We do have records of a chest CT done for a pulmonary nodule that showed severe coronary calcification. No prior history of MI. Thinks he had Afib 2-3 years ago. Initially on coumadin but later switched to Xarelto. 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 He has a history of DM on insulin, HTN, and HLD. Has CKD stage 3.     Past Medical History:  Diagnosis Date  . 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 Asthma   . Atrial fibrillation ([place]) 09/02/2018  . CHF (congestive heart failure) ([place])   . Controlled diabetes mellitus type 2 with complications ([place]) [id number]  . Coronary artery calcification 06/11/2019  . Hyperlipidemia   . Hypertension     History reviewed. No pertinent surgical history.   Current Outpatient Medications  Medication Sig Dispense Refill  . Adalimumab (HUMIRA PEN) 40 MG/0.4ML PNKT Inject 1 each into the skin 2 (two) times a week.     Marland Kitchen albuterol (PROAIR HFA) 108 (90 Base) MCG/ACT inhaler Inhale 1 puff into the lungs every 6 (six) hours as needed for wheezing or shortness of  breath. 18 g 1  . atorvastatin (LIPITOR) 80 MG tablet Take 80 mg by mouth daily.    . carvedilol (COREG) 12.5 MG tablet Take 12.5 mg by mouth 2 (two) times daily with a meal.    . empagliflozin (JARDIANCE) 25 MG TABS tablet Take 25 mg by mouth daily.    . Fenofibrate 50 MG CAPS Take 50 mg by mouth daily.    . furosemide (LASIX) 20 MG tablet Take 20 mg by mouth.    Marland Kitchen HYDROcodone-acetaminophen (NORCO) 10-325 MG tablet Take 1 tablet by mouth every 6 (six) hours as needed.    . insulin glargine (LANTUS) 100 UNIT/ML injection Inject 40 Units into the skin at bedtime.     Marland Kitchen linaGLIPtin-metFORMIN HCl (JENTADUETO) 2.5-500 MG TABS Take 2 tablets by mouth daily.    Marland Kitchen lisinopril (ZESTRIL) 10 MG tablet Take 10 mg by mouth daily.    . metFORMIN (GLUCOPHAGE) 1000 MG tablet Take 1,000 mg by mouth daily with breakfast.    . spironolactone (ALDACTONE) 25 MG tablet Take 25 mg by mouth daily.    . SYMBICORT 160-4.5 MCG/ACT inhaler INHALE 2 PUFFS INTO THE LUNGS TWICE DAILY 10.2 g 0  . XARELTO 20 MG TABS tablet TAKE 1 TABLET(20 MG) BY MOUTH DAILY WITH SUPPER 90 tablet 3   No current facility-administered medications for this visit.  Allergies:   Patient has no known allergies.    Social History:  The patient  reports that he has never smoked. His smokeless tobacco use includes chew. He reports current alcohol use of about 10.0 standard drinks of alcohol per week. He reports current drug use. Drug: Marijuana.   Family History:  The patient's family history includes Hypertension in his mother; Lupus in his mother.    ROS:  Please see the history of present illness.   Otherwise, review of systems are positive for none.   All other systems are reviewed and negative.    PHYSICAL EXAM: VS:  BP (!) 143/98   Pulse 84   Temp (!) 96.3 F (35.7 C)   Ht 5\' 9"  (1.753 m)   Wt 212 lb 6.4 oz (96.3 kg)   SpO2 96%   BMI 31.37 kg/m  , BMI Body mass index is 31.37 kg/m. GEN: Well nourished, well developed, in no  acute distress  HEENT: normal  Neck: no JVD, carotid bruits, or masses Cardiac: RRR; no murmurs, rubs, or gallops,no edema  Respiratory:  Few scattered wheezes.  GI: soft, nontender, nondistended, + BS MS: no deformity or atrophy  Skin: warm and dry, no rash Neuro:  Strength and sensation are intact Psych: euthymic mood, full affect   EKG:  EKG is not ordered today. The ekg ordered 06/11/19 demonstrates NSR. Cannot rule out old anterior infarct with poor R wave progression in anterior leads. I have personally reviewed and interpreted this study.    Recent Labs: 06/11/2019: ALT 23; BNP 223.4; BUN 34; Creatinine, Ser 1.37; Hemoglobin 15.3; Platelets 159; Potassium 4.7; Sodium 143; TSH 2.060    Lipid Panel    Component Value Date/Time   CHOL 133 06/11/2019 1027   TRIG 107 06/11/2019 1027   HDL 48 06/11/2019 1027   CHOLHDL 2.8 06/11/2019 1027   LDLCALC 65 06/11/2019 1027      Wt Readings from Last 3 Encounters:  06/19/19 212 lb 6.4 oz (96.3 kg)  06/11/19 213 lb (96.6 kg)  01/28/19 200 lb (90.7 kg)      Other studies Reviewed: Additional studies/ records that were reviewed today include: see above   ASSESSMENT AND PLAN:  1.  CHF. Cardiac function/EF is unknown. Currently appears fairly well compensated on aldactone, Coreg, and lisinopril. I have requested records of any prior cardiac work up from his physician in [place]. Will obtain an Echo. Recent BNP mildly elevated 223 but no baseline for comparison. Will follow up in a few weeks. Recent CXR clear. No significant edema. 2. Atrial fibrillation- paroxysmal. In NSR now. On chronic Xarelto.  3. CAD. Based on heavy coronary calcification on CT. No active angina. Apparent ischemic work up with stress testing at some point in the past. Will request old records. 4. HLD. On statin with excellent control 5. DM on insulin per primary care.  6. Asthma. Depending on results of Echo may want to consider changing Coreg to a more selective  beta blocker like bisoprolol.  7. CKD stage 3.  8. HTN controlled.   Current medicines are reviewed at length with the patient today.  The patient does not have concerns regarding medicines.  The following changes have been made:  no change  Labs/ tests ordered today include:   Orders Placed This Encounter  Procedures  . ECHOCARDIOGRAM COMPLETE     Disposition:   FU with me in 4-6 weeks.  Signed, Denim Start Martinique, MD  06/19/2019 5:44 PM    [facility] [address] [place], [place], [address] Phone [phone], Fax [phone]

## 2019-06-26 ENCOUNTER — Other Ambulatory Visit: Payer: Self-pay

## 2019-06-26 ENCOUNTER — Ambulatory Visit (HOSPITAL_COMMUNITY): Payer: Medicare Other | Attending: Cardiology

## 2019-06-26 DIAGNOSIS — I509 Heart failure, unspecified: Secondary | ICD-10-CM

## 2019-06-27 ENCOUNTER — Other Ambulatory Visit: Payer: Self-pay

## 2019-06-27 DIAGNOSIS — I700 Atherosclerosis of aorta: Secondary | ICD-10-CM

## 2019-06-27 DIAGNOSIS — I7789 Other specified disorders of arteries and arterioles: Secondary | ICD-10-CM

## 2019-06-30 ENCOUNTER — Other Ambulatory Visit: Payer: Self-pay | Admitting: Family Medicine

## 2019-06-30 NOTE — Telephone Encounter (Signed)
Is this ok to refill?  

## 2019-07-04 ENCOUNTER — Other Ambulatory Visit: Payer: Self-pay

## 2019-07-04 ENCOUNTER — Ambulatory Visit (HOSPITAL_COMMUNITY)
Admission: RE | Admit: 2019-07-04 | Discharge: 2019-07-04 | Disposition: A | Discharge status: Home / Self Care | Payer: Medicare Other | Source: Ambulatory Visit | Attending: Cardiology | Admitting: Cardiology

## 2019-07-04 DIAGNOSIS — I7789 Other specified disorders of arteries and arterioles: Secondary | ICD-10-CM | POA: Insufficient documentation

## 2019-07-04 DIAGNOSIS — I700 Atherosclerosis of aorta: Secondary | ICD-10-CM | POA: Diagnosis present

## 2019-07-04 MED ORDER — IOHEXOL 350 MG/ML SOLN
100.0000 mL | Freq: Once | INTRAVENOUS | Status: AC | PRN
  Administered 2019-07-04: 17:00:00 100 mL via INTRAVENOUS

## 2019-07-07 ENCOUNTER — Other Ambulatory Visit: Payer: Self-pay

## 2019-07-07 ENCOUNTER — Encounter: Payer: Self-pay | Admitting: Family Medicine

## 2019-07-07 DIAGNOSIS — I712 Thoracic aortic aneurysm, without rupture, unspecified: Secondary | ICD-10-CM

## 2019-07-19 NOTE — Progress Notes (Signed)
Cardiology Office Note   Date:  07/22/2019   ID:  Theodore Bush, DOB [birth year], MRN [id number]  PCP:  Girtha Rm, NP-C  Cardiologist:   Brynlie Daza Martinique, MD   Chief Complaint  Patient presents with  . Congestive Heart Failure      History of Present Illness: Theodore Bush is a 68 y.o. male who is for follow up  of CHF and history of Afib. I saw him for the first time one month ago. Patient moved to [facility] a couple of years ago to be closer to family. Moved from [place]. He has a history of atrial fibrillation, CHF and CAD. He reports he has been admitted a couple of times for PNA and at one time had "fluid in the lungs" and required thoracentesis. He has a difficult time remembering when he was hospitalized or any details about prior cardiac work up. States he did have a stress test at some point in the past. We do have records of a chest CT done for a pulmonary nodule that showed severe coronary calcification. No prior history of MI. Thinks he had Afib 2-3 years ago. Initially on coumadin but later switched to Xarelto. 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 He has a history of DM on insulin, HTN, and HLD. Has CKD stage 3.   We requested old records on his last visit. To date we have received none. An Echo was obtained here showing an EF of 35-40%.Global hypokinesis. Aortic root enlarged to 49 mm. CT chest done showing aneurysm of 4.7 cm. Coronary calcification. Scattered pulmonary nodules. Evidence of cirrhosis.   On follow up today he is feeling very well. No chest pain or SOB. No edema or palpitations. Notes he used to drink heavily in the past- a bottle of whiskey a day. Now he has occasional 1-2 drinks.     Past Medical History:  Diagnosis Date  . 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 Asthma   . Atrial fibrillation ([place]) 09/02/2018  . CHF (congestive heart failure) ([place])   . Controlled diabetes mellitus type 2 with  complications ([place]) [id number]  . Coronary artery calcification 06/11/2019  . Hyperlipidemia   . Hypertension     History reviewed. No pertinent surgical history.   Current Outpatient Medications  Medication Sig Dispense Refill  . Adalimumab (HUMIRA PEN) 40 MG/0.4ML PNKT Inject 1 each into the skin 2 (two) times a week. 2 times a months.    Marland Kitchen albuterol (PROAIR HFA) 108 (90 Base) MCG/ACT inhaler Inhale 1 puff into the lungs every 6 (six) hours as needed for wheezing or shortness of breath. 18 g 1  . atorvastatin (LIPITOR) 80 MG tablet Take 80 mg by mouth daily.    . carvedilol (COREG) 12.5 MG tablet Take 12.5 mg by mouth 2 (two) times daily with a meal.    . empagliflozin (JARDIANCE) 25 MG TABS tablet Take 25 mg by mouth daily.    . Fenofibrate 50 MG CAPS Take 50 mg by mouth daily.    . furosemide (LASIX) 20 MG tablet Take 20 mg by mouth.    Marland Kitchen HYDROcodone-acetaminophen (NORCO) 10-325 MG tablet Take 1 tablet by mouth every 6 (six) hours as needed.    . insulin glargine (LANTUS) 100 UNIT/ML injection Inject 40 Units into the skin at bedtime.     Marland Kitchen linaGLIPtin-metFORMIN HCl (JENTADUETO) 2.5-500 MG TABS Take 2 tablets by mouth daily.    Marland Kitchen lisinopril (  ZESTRIL) 10 MG tablet Take 10 mg by mouth daily.    . metFORMIN (GLUCOPHAGE) 1000 MG tablet Take 1,000 mg by mouth daily with breakfast.    . spironolactone (ALDACTONE) 25 MG tablet Take 25 mg by mouth daily.    . SYMBICORT 160-4.5 MCG/ACT inhaler INHALE 2 PUFFS INTO THE LUNGS TWICE DAILY 10.2 g 0  . XARELTO 20 MG TABS tablet TAKE 1 TABLET(20 MG) BY MOUTH DAILY WITH SUPPER 90 tablet 3   No current facility-administered medications for this visit.     Allergies:   Quinolones    Social History:  The patient  reports that he has never smoked. His smokeless tobacco use includes chew. He reports current alcohol use of about 10.0 standard drinks of alcohol per week. He reports current drug use. Drug: Marijuana.   Family History:  The patient's  family history includes Hypertension in his mother; Lupus in his mother.    ROS:  Please see the history of present illness.   Otherwise, review of systems are positive for none.   All other systems are reviewed and negative.    PHYSICAL EXAM: VS:  BP (!) 130/95   Pulse 80   Ht 5\' 9"  (1.753 m)   Wt 206 lb (93.4 kg)   SpO2 95%   BMI 30.42 kg/m  , BMI Body mass index is 30.42 kg/m. GEN: Well nourished, well developed, in no acute distress  HEENT: normal  Neck: no JVD, carotid bruits, or masses Cardiac: RRR; no murmurs, rubs, or gallops,no edema  Respiratory:  Few scattered wheezes.  GI: soft, nontender, nondistended, + BS MS: no deformity or atrophy  Skin: warm and dry, no rash Neuro:  Strength and sensation are intact Psych: euthymic mood, full affect   EKG:  EKG is not ordered today.  Recent Labs: 06/11/2019: ALT 23; BNP 223.4; BUN 34; Creatinine, Ser 1.37; Hemoglobin 15.3; Platelets 159; Potassium 4.7; Sodium 143; TSH 2.060    Lipid Panel    Component Value Date/Time   CHOL 133 06/11/2019 1027   TRIG 107 06/11/2019 1027   HDL 48 06/11/2019 1027   CHOLHDL 2.8 06/11/2019 1027   LDLCALC 65 06/11/2019 1027      Wt Readings from Last 3 Encounters:  07/22/19 206 lb (93.4 kg)  06/19/19 212 lb 6.4 oz (96.3 kg)  06/11/19 213 lb (96.6 kg)      Other studies Reviewed: Additional studies/ records that were reviewed today include:   Echo 06/26/19: IMPRESSIONS    1. Left ventricular ejection fraction, by visual estimation, is 35 to 40%. The left ventricle has moderately decreased function. Normal left ventricular size. There is mildly increased left ventricular hypertrophy. Diffuse hypokinesis.  2. Left ventricular diastolic Doppler parameters are indeterminate pattern of LV diastolic filling. Rhythm uncertain.  3. Trivial pericardial effusion is present.  4. Moderate mitral annular calcification.  5. The mitral valve is normal in structure. Trace mitral valve  regurgitation. No evidence of mitral stenosis.  6. The tricuspid valve is normal in structure. Tricuspid valve regurgitation is trivial.  7. The aortic valve is tricuspid Aortic valve regurgitation is mild by color flow Doppler. Mild to moderate aortic valve sclerosis/calcification without any evidence of aortic stenosis.  8. There is severe dilatation of the ascending aorta measuring 49 mm. Suggest MRA or CTA chest to further evaluate.  9. Global right ventricle has mildly reduced systolic function.The right ventricular size is normal. No increase in right ventricular wall thickness. 10. Left atrial size was moderately dilated.  11. Right atrial size was mildly dilated. 12. The inferior vena cava is normal in size with greater than 50% respiratory variability, suggesting right atrial pressure of 3 mmHg. 13. TR signal is inadequate for assessing pulmonary artery systolic pressure.  Chest CT 07/04/19: IMPRESSION: 1. No acute intrathoracic pathology. No CT evidence of aortic dissection. 2. Dilated ascending aorta measuring up to 4.7 cm in diameter. Ascending thoracic aortic aneurysm. Recommend semi-annual imaging followup by CTA or MRA and referral to cardiothoracic surgery if not already obtained. This recommendation follows 2010 ACCF/AHA/AATS/ACR/ASA/SCA/SCAI/SIR/STS/SVM Guidelines for the Diagnosis and Management of Patients With Thoracic Aortic Disease. Circulation. 2010; [id number]. Aortic aneurysm NOS (ICD10-I71.9) 3. Cardiomegaly with 3 vessel coronary vascular calcification. 4. Small pericardial effusion. 5. Several small pulmonary nodules measure up to 4 mm. No follow-up needed if patient is low-risk (and has no known or suspected primary neoplasm). Non-contrast chest CT can be considered in 12 months if patient is high-risk. This recommendation follows the consensus statement: Guidelines for Management of Incidental Pulmonary Nodules Detected on CT Images: From the Fleischner  Society 2017; Radiology 2017; 284:228-243. 6. Cirrhosis.  ASSESSMENT AND PLAN:  1.  CHF. Chronic systolic. EF [id number].  Currently appears  well compensated on aldactone, Coreg, and lisinopril. Also on Jardiance. Suspect LV dysfunction may be related to history of Etoh abuse. Recommend he avoid Etoh. If symptoms or EF deteriorate in time would switch lisinopril to [facility]. Sodium restriction. 2. Atrial fibrillation- paroxysmal. In NSR now. On chronic Xarelto.  3. CAD. Based on heavy coronary calcification on CT. No active angina. Apparent ischemic work up with stress testing at some point in the past. Will request old records. 4. HLD. On statin with excellent control 5. DM on insulin per primary care.  6. Asthma. Controlled.  7. CKD stage 3.  8. HTN controlled. 9. Thoracic aortic aneurysm. 4.7 cm. Will need yearly CT. Recommend avoidance of fluoroquinolone antibiotics.  10. History of Etoh abuse. Cirrhosis noted on CT. Recommend abstinence.   Current medicines are reviewed at length with the patient today.  The patient does not have concerns regarding medicines.  The following changes have been made:  no change  Labs/ tests ordered today include:   No orders of the defined types were placed in this encounter.    Disposition:   FU with me in 6 months  Signed, Shakeda Pearse Martinique, MD  07/22/2019 3:45 PM    [facility] [address] [place], [place], [address] Phone [phone], Fax [phone]

## 2019-07-22 ENCOUNTER — Other Ambulatory Visit: Payer: Self-pay

## 2019-07-22 ENCOUNTER — Other Ambulatory Visit: Payer: Self-pay | Admitting: Family Medicine

## 2019-07-22 ENCOUNTER — Ambulatory Visit (INDEPENDENT_AMBULATORY_CARE_PROVIDER_SITE_OTHER): Payer: Medicare Other | Admitting: Cardiology

## 2019-07-22 ENCOUNTER — Encounter: Payer: Self-pay | Admitting: Cardiology

## 2019-07-22 VITALS — BP 130/95 | HR 80 | Ht 69.0 in | Wt 206.0 lb

## 2019-07-22 DIAGNOSIS — I480 Paroxysmal atrial fibrillation: Secondary | ICD-10-CM

## 2019-07-22 DIAGNOSIS — I712 Thoracic aortic aneurysm, without rupture, unspecified: Secondary | ICD-10-CM

## 2019-07-22 DIAGNOSIS — I5022 Chronic systolic (congestive) heart failure: Secondary | ICD-10-CM

## 2019-07-22 DIAGNOSIS — I10 Essential (primary) hypertension: Secondary | ICD-10-CM

## 2019-07-22 DIAGNOSIS — E7800 Pure hypercholesterolemia, unspecified: Secondary | ICD-10-CM | POA: Diagnosis not present

## 2019-07-22 DIAGNOSIS — I2584 Coronary atherosclerosis due to calcified coronary lesion: Secondary | ICD-10-CM | POA: Diagnosis not present

## 2019-07-22 DIAGNOSIS — I2510 Atherosclerotic heart disease of native coronary artery without angina pectoris: Secondary | ICD-10-CM | POA: Diagnosis not present

## 2019-07-22 NOTE — Telephone Encounter (Signed)
[facility] is requesting to fill pt spirolactone . Please advise [facility]

## 2019-08-07 ENCOUNTER — Telehealth: Payer: Self-pay

## 2019-08-07 ENCOUNTER — Other Ambulatory Visit: Payer: Self-pay | Admitting: Family Medicine

## 2019-08-07 NOTE — Telephone Encounter (Signed)
This was already refilled today with pharmacy

## 2019-08-07 NOTE — Telephone Encounter (Signed)
Pt. Called LM stating he needs a refill on his symbicort sent to [facility] pt last apt was 06/11/19

## 2019-08-07 NOTE — Telephone Encounter (Signed)
Is this okay to refill? 

## 2019-08-12 ENCOUNTER — Telehealth: Payer: Self-pay | Admitting: Family Medicine

## 2019-08-12 MED ORDER — CARVEDILOL 12.5 MG PO TABS: 12.5000 mg | ORAL_TABLET | Freq: Two times a day (BID) | ORAL | 2 refills | Status: DC

## 2019-08-12 MED ORDER — LISINOPRIL 10 MG PO TABS: 10.0000 mg | ORAL_TABLET | Freq: Every day | ORAL | 2 refills | Status: DC

## 2019-08-12 MED ORDER — INSULIN GLARGINE 100 UNIT/ML ~~LOC~~ SOLN: 40.0000 [IU] | Freq: Every day | SUBCUTANEOUS | 2 refills | Status: DC

## 2019-08-12 NOTE — Telephone Encounter (Signed)
[facility]. Pt called for refills on Lantus. Please send to [facility]. This one must be filled here. They can be reached at [phone].   Also so needs Carvedilol and Lisinopril sent to local pharmacy [facility]. Pt can be reached at [phone].

## 2019-08-12 NOTE — Telephone Encounter (Signed)
[facility] Sent carvedilol and [facility]opril to [facility]

## 2019-08-19 ENCOUNTER — Telehealth: Payer: Self-pay

## 2019-08-19 MED ORDER — INSULIN GLARGINE 100 UNIT/ML ~~LOC~~ SOLN: 40.0000 [IU] | Freq: Every day | SUBCUTANEOUS | 2 refills | Status: DC

## 2019-08-19 NOTE — Telephone Encounter (Signed)
Pt. Called LM stating that he needs his Lantus switched to [facility] instead of [facility] they are giving him a hard time about refilling it. Pt. Last seen 06/11/19.

## 2019-08-19 NOTE — Telephone Encounter (Signed)
done

## 2019-08-22 ENCOUNTER — Other Ambulatory Visit: Payer: Self-pay | Admitting: Internal Medicine

## 2019-08-22 MED ORDER — PEN NEEDLES 32G X 4 MM MISC: 1.0000 | Freq: Every day | 2 refills | Status: AC

## 2019-08-22 MED ORDER — LANTUS SOLOSTAR 100 UNIT/ML ~~LOC~~ SOPN: 40.0000 [IU] | PEN_INJECTOR | Freq: Every day | SUBCUTANEOUS | 1 refills | Status: DC

## 2019-09-02 ENCOUNTER — Other Ambulatory Visit: Payer: Self-pay | Admitting: Family Medicine

## 2019-09-09 ENCOUNTER — Other Ambulatory Visit: Payer: Self-pay | Admitting: Family Medicine

## 2019-09-09 DIAGNOSIS — Z7901 Long term (current) use of anticoagulants: Secondary | ICD-10-CM

## 2019-09-09 DIAGNOSIS — I4819 Other persistent atrial fibrillation: Secondary | ICD-10-CM

## 2019-09-09 NOTE — Telephone Encounter (Signed)
Please advise if pt xarelto can be filled it was sent to Dr. Redmond School. Kh

## 2019-09-29 ENCOUNTER — Other Ambulatory Visit: Payer: Self-pay | Admitting: Family Medicine

## 2019-09-29 NOTE — Telephone Encounter (Signed)
Is this okay to refill? 

## 2019-10-08 ENCOUNTER — Telehealth: Payer: Self-pay | Admitting: Family Medicine

## 2019-10-08 MED ORDER — JENTADUETO 2.5-500 MG PO TABS: 2.0000 | ORAL_TABLET | Freq: Every day | ORAL | 1 refills | Status: DC

## 2019-10-08 NOTE — Telephone Encounter (Signed)
done

## 2019-10-08 NOTE — Telephone Encounter (Signed)
Pt called and is requesting a refill on his jentadueto please send to [place] [place]

## 2019-10-09 ENCOUNTER — Other Ambulatory Visit: Payer: Self-pay | Admitting: Internal Medicine

## 2019-10-09 MED ORDER — FENOFIBRATE 50 MG PO CAPS: 50.0000 mg | ORAL_CAPSULE | Freq: Every day | ORAL | 0 refills | Status: DC

## 2019-10-15 ENCOUNTER — Other Ambulatory Visit: Payer: Medicare Other

## 2019-10-16 ENCOUNTER — Ambulatory Visit: Payer: 59 | Attending: Internal Medicine

## 2019-10-16 DIAGNOSIS — Z20822 Contact with and (suspected) exposure to covid-19: Secondary | ICD-10-CM

## 2019-10-17 LAB — NOVEL CORONAVIRUS, NAA: SARS-CoV-2, NAA: NOT DETECTED

## 2019-10-20 ENCOUNTER — Ambulatory Visit: Payer: 59 | Attending: Internal Medicine

## 2019-10-20 DIAGNOSIS — Z20822 Contact with and (suspected) exposure to covid-19: Secondary | ICD-10-CM

## 2019-10-21 LAB — NOVEL CORONAVIRUS, NAA: SARS-CoV-2, NAA: NOT DETECTED

## 2019-10-24 ENCOUNTER — Telehealth: Payer: Self-pay | Admitting: Internal Medicine

## 2019-10-24 NOTE — Telephone Encounter (Signed)
His pharmacy keeps sending me information suggesting he is not on one. If he is taking a statin already then just follow up when due.

## 2019-10-24 NOTE — Telephone Encounter (Signed)
He is on atorvastatin

## 2019-10-24 NOTE — Telephone Encounter (Signed)
Pt is already on a statin with diabetes. Does he still need an appt

## 2019-10-27 ENCOUNTER — Other Ambulatory Visit: Payer: Self-pay | Admitting: Family Medicine

## 2019-10-28 ENCOUNTER — Other Ambulatory Visit: Payer: Self-pay | Admitting: Family Medicine

## 2019-11-21 ENCOUNTER — Telehealth: Payer: Self-pay

## 2019-11-21 NOTE — Telephone Encounter (Signed)
Please advise as I am not sure Vickie will look at her computer today

## 2019-11-21 NOTE — Telephone Encounter (Signed)
Recommend plenty of fluids, Tylenol for aches and pains, Robitussin-DM during the day and NyQuil at night.

## 2019-11-21 NOTE — Telephone Encounter (Signed)
Pt. Called stating that he has a cold he knows its not covid already got one covid vaccine, coughing up a lot of phelgm, and nose is runny, has a little SOB but that is because of the congestion and he has asthma. He just wanted some OTC recommendation for the weekend and if it got worse he would call Monday for an apt.

## 2019-11-21 NOTE — Telephone Encounter (Signed)
Recommendations given to pt. And he said he would call back Monday if needed, if he doesn't get any better.

## 2019-11-25 ENCOUNTER — Other Ambulatory Visit: Payer: Self-pay | Admitting: Family Medicine

## 2019-11-25 NOTE — Telephone Encounter (Signed)
Pt has an appt on march 25

## 2019-11-26 ENCOUNTER — Other Ambulatory Visit: Payer: Self-pay | Admitting: Family Medicine

## 2019-12-02 ENCOUNTER — Other Ambulatory Visit: Payer: Self-pay | Admitting: Family Medicine

## 2019-12-02 NOTE — Telephone Encounter (Signed)
Pt has upcoming appt at end of march. Pt just had atorvastatin refilled on 3/10

## 2019-12-10 NOTE — Progress Notes (Signed)
Subjective:    Patient ID: Theodore Bush, male    DOB: 01-10-1951, 69 y.o.   MRN: [id number]  Theodore Bush is a 69 y.o. male who presents for follow-up of Type 2 diabetes mellitus and other chronic health conditions.  Reports asthma does not feel controlld. He is using Symbicort twice daily and still needing albuterol 3-4 times per week.  He often wheezes and coughs. Reports having pneumonia twice in the past 5 years.  Has not seen a pulmonologist since moving here.  Has never been on oxygen.   Denies fever, chills, dizziness, chest pain, palpitations abdominal pain, N/V/D, urinary symptoms, LE edema.    Covid-19 vaccines received, Moderna. Last one 2 weeks ago   Dr. Martinique is treating his HTN, HL, A-fib and CHF.   Dr. Amil Amen is treating his RA.   States he had a BS reading of 57 once. No other low readings.    Patient is checking home blood sugars.   Home blood sugar records: BGs range between 107 and 180 How often is blood sugars being checked: once a day Current symptoms include: none. Patient denies increased appetite, nausea and weight loss.  Patient is checking their feet daily. Any Foot concerns (callous, ulcer, wound, thickened nails, toenail fungus, skin fungus, hammer toe): none Last dilated eye exam: year ago  Current treatments: Lantus 40 units, Metformin 1,000 mg in the evening, Jentadueto 5-1,000 mg each morning and Jardiance. This regimen was started by his previous PCP.  Medication compliance: good  Current diet: in general, a "healthy" diet   Current exercise: yard work Known diabetic complications: atherosclerosis   The following portions of the patient's history were reviewed and updated as appropriate: allergies, current medications, past medical history, past social history and problem list.  ROS as in subjective above.     Objective:    Physical Exam Alert and in no distress.  Cardiac exam shows an irregular rhythm. Lung exam shows expiratory  wheezes, shallow breathing but normal rate and work of breathing.    Blood pressure 120/80, pulse 90, temperature 97.8 F (36.6 C), weight 206 lb 3.2 oz (93.5 kg), SpO2 94 %.  Lab Review Diabetic Labs Latest Ref Rng & Units 06/11/2019 08/14/2017  HbA1c 4.0 - 5.6 % 6.6(A) -  Chol 100 - 199 mg/dL 133 -  HDL >39 mg/dL 48 -  Calc LDL 0 - 99 mg/dL 65 -  Triglycerides 0 - 149 mg/dL 107 -  Creatinine 0.76 - 1.27 mg/dL 1.37(H) 1.36(H)   BP/Weight 12/11/2019 07/22/2019 06/19/2019 06/11/2019 [id number]  Systolic BP 123456 [id number] -  Diastolic BP 80 95 98 78 -  Wt. (Lbs) 206.2 206 212.4 213 200  BMI 30.45 30.42 31.37 31.45 29.53   No flowsheet data found.  Theodore Bush  reports that he has never smoked. His smokeless tobacco use includes chew. He reports current alcohol use of about 10.0 standard drinks of alcohol per week. He reports current drug use. Drug: Marijuana.     Assessment & Plan:    Controlled type 2 diabetes mellitus with complication, with long-term current use of insulin ([place]) - Plan: CBC with Differential/Platelet, Comprehensive metabolic panel  Hypertension, unspecified type - Plan: CBC with Differential/Platelet, Comprehensive metabolic panel  Chronic congestive heart failure, unspecified heart failure type ([place]) - Plan: CBC with Differential/Platelet, Comprehensive metabolic panel  Atrial fibrillation, unspecified type (HCC)  Chronic anticoagulation  Chronic kidney disease, unspecified CKD stage - Plan: Comprehensive metabolic panel  Moderate persistent asthma, unspecified whether complicated -  Plan: Ambulatory referral to Pulmonology, CBC with Differential/Platelet  History of pneumonia, recurrent - Plan: Ambulatory referral to Pulmonology  Mixed dyslipidemia - Plan: Lipid panel  1. Rx changes: stop Jentadueto. Take Metformin 1,000 mg twice daily. continue on Jardiance and Lantus 40 units  Hgb A1c 6.1%  2. He will keep a close eye on his BS and if he has any readings <80  he will let me know.  3. Education: Reviewed 'ABCs' of diabetes management (respective goals in parentheses):  A1C (<7), blood pressure (<130/80), and cholesterol (LDL <100). 4. Compliance at present is estimated to be good. Efforts to improve compliance (if necessary) will be directed at regular blood sugar monitoring: 2 times daily. 5. HTN- BP well controlled.  6. CHF- appears euvolemic 7. A-fib and chronic anticoagulation- tolerating Xarelto. No sign of bleeding  8. RA managed by Dr. Amil Amen  9. HL-continue on statin and fenofibrate. We discussed the increased risk of myalgias and rhabdo. He is staying well hydrated and his urine is always light yellow. Encouraged him to be aware of any worrisome symptoms. He will check with Dr. Martinique regarding whether the benefits of taking both medications outweigh the risks. Check lipid panel.  10. Asthma is not well controlled. Continue Symbicort and use albuterol as needed. Referral to pulmonology for further evaluation and recommendations.  11. Follow up: 2 months for Medicare Wellness, CPE

## 2019-12-11 ENCOUNTER — Encounter: Payer: Self-pay | Admitting: Family Medicine

## 2019-12-11 ENCOUNTER — Other Ambulatory Visit: Payer: Self-pay

## 2019-12-11 ENCOUNTER — Ambulatory Visit (INDEPENDENT_AMBULATORY_CARE_PROVIDER_SITE_OTHER): Payer: Medicare Other | Admitting: Family Medicine

## 2019-12-11 VITALS — BP 120/80 | HR 90 | Temp 97.8°F | Wt 206.2 lb

## 2019-12-11 DIAGNOSIS — I509 Heart failure, unspecified: Secondary | ICD-10-CM | POA: Diagnosis not present

## 2019-12-11 DIAGNOSIS — E782 Mixed hyperlipidemia: Secondary | ICD-10-CM

## 2019-12-11 DIAGNOSIS — I10 Essential (primary) hypertension: Secondary | ICD-10-CM

## 2019-12-11 DIAGNOSIS — Z8701 Personal history of pneumonia (recurrent): Secondary | ICD-10-CM

## 2019-12-11 DIAGNOSIS — I4891 Unspecified atrial fibrillation: Secondary | ICD-10-CM

## 2019-12-11 DIAGNOSIS — Z7901 Long term (current) use of anticoagulants: Secondary | ICD-10-CM

## 2019-12-11 DIAGNOSIS — E118 Type 2 diabetes mellitus with unspecified complications: Secondary | ICD-10-CM

## 2019-12-11 DIAGNOSIS — N189 Chronic kidney disease, unspecified: Secondary | ICD-10-CM

## 2019-12-11 DIAGNOSIS — Z794 Long term (current) use of insulin: Secondary | ICD-10-CM

## 2019-12-11 DIAGNOSIS — J4540 Moderate persistent asthma, uncomplicated: Secondary | ICD-10-CM

## 2019-12-11 MED ORDER — METFORMIN HCL 1000 MG PO TABS: 1000.0000 mg | ORAL_TABLET | Freq: Two times a day (BID) | ORAL | 0 refills | Status: DC

## 2019-12-11 MED ORDER — LANTUS SOLOSTAR 100 UNIT/ML ~~LOC~~ SOPN: PEN_INJECTOR | SUBCUTANEOUS | 1 refills | Status: DC

## 2019-12-11 NOTE — Patient Instructions (Addendum)
Your hemoglobin A1c is 6.1% today.   Stop the [facility] when you run out.   Take Metformin 1,000 mg twice daily. Continue on Jardiance and Lantus 40 units.   Keep a close eye on your blood sugars. If you see any low readings less than 80, let me know and we will need to cut the Lantus dose back.   You will hear from [facility]monology regarding your asthma. Continue Symbicort twice daily.   Return in 2 months (or so) for your Medicare Wellness visit.

## 2019-12-12 LAB — CBC WITH DIFFERENTIAL/PLATELET
Basophils Absolute: 0 10*3/uL (ref 0.0–0.2)
Basos: 0 %
EOS (ABSOLUTE): 0.4 10*3/uL (ref 0.0–0.4)
Eos: 8 %
Hematocrit: 44.6 % (ref 37.5–51.0)
Hemoglobin: 15.4 g/dL (ref 13.0–17.7)
Immature Grans (Abs): 0 10*3/uL (ref 0.0–0.1)
Immature Granulocytes: 1 %
Lymphocytes Absolute: 2.2 10*3/uL (ref 0.7–3.1)
Lymphs: 39 %
MCH: 31.1 pg (ref 26.6–33.0)
MCHC: 34.5 g/dL (ref 31.5–35.7)
MCV: 90 fL (ref 79–97)
Monocytes Absolute: 0.5 10*3/uL (ref 0.1–0.9)
Monocytes: 8 %
Neutrophils Absolute: 2.5 10*3/uL (ref 1.4–7.0)
Neutrophils: 44 %
Platelets: 183 10*3/uL (ref 150–450)
RBC: 4.95 x10E6/uL (ref 4.14–5.80)
RDW: 13.6 % (ref 11.6–15.4)
WBC: 5.5 10*3/uL (ref 3.4–10.8)

## 2019-12-12 LAB — LIPID PANEL
Chol/HDL Ratio: 2.5 ratio (ref 0.0–5.0)
Cholesterol, Total: 132 mg/dL (ref 100–199)
HDL: 52 mg/dL (ref >39)
LDL Chol Calc (NIH): 59 mg/dL (ref 0–99)
Triglycerides: 118 mg/dL (ref 0–149)
VLDL Cholesterol Cal: 21 mg/dL (ref 5–40)

## 2019-12-12 LAB — COMPREHENSIVE METABOLIC PANEL
ALT: 25 IU/L (ref 0–44)
AST: 31 IU/L (ref 0–40)
Albumin/Globulin Ratio: 1.5 (ref 1.2–2.2)
Albumin: 4.6 g/dL (ref 3.8–4.8)
Alkaline Phosphatase: 32 IU/L — ABNORMAL LOW (ref 39–117)
BUN/Creatinine Ratio: 15 (ref 10–24)
BUN: 20 mg/dL (ref 8–27)
Bilirubin Total: 0.5 mg/dL (ref 0.0–1.2)
CO2: 27 mmol/L (ref 20–29)
Calcium: 9.9 mg/dL (ref 8.6–10.2)
Chloride: 98 mmol/L (ref 96–106)
Creatinine, Ser: 1.3 mg/dL — ABNORMAL HIGH (ref 0.76–1.27)
GFR calc Af Amer: 65 mL/min/{1.73_m2} (ref >59)
GFR calc non Af Amer: 56 mL/min/{1.73_m2} — ABNORMAL LOW (ref >59)
Globulin, Total: 3 g/dL (ref 1.5–4.5)
Glucose: 97 mg/dL (ref 65–99)
Potassium: 5.2 mmol/L (ref 3.5–5.2)
Sodium: 142 mmol/L (ref 134–144)
Total Protein: 7.6 g/dL (ref 6.0–8.5)

## 2019-12-24 ENCOUNTER — Other Ambulatory Visit: Payer: Self-pay | Admitting: Family Medicine

## 2019-12-25 ENCOUNTER — Telehealth: Payer: Self-pay | Admitting: Family Medicine

## 2019-12-25 MED ORDER — BUDESONIDE-FORMOTEROL FUMARATE 160-4.5 MCG/ACT IN AERO: 2.0000 | INHALATION_SPRAY | Freq: Two times a day (BID) | RESPIRATORY_TRACT | 0 refills | Status: DC

## 2019-12-25 NOTE — Telephone Encounter (Signed)
Please take care of this for him

## 2019-12-25 NOTE — Telephone Encounter (Signed)
Pt called and left message he is in [facility] and needs his Symbicort sent to [phone] in [place]

## 2019-12-25 NOTE — Telephone Encounter (Signed)
Sent med into pharmacy 

## 2019-12-29 ENCOUNTER — Telehealth: Payer: Self-pay

## 2019-12-29 MED ORDER — BUDESONIDE-FORMOTEROL FUMARATE 160-4.5 MCG/ACT IN AERO: 2.0000 | INHALATION_SPRAY | Freq: Two times a day (BID) | RESPIRATORY_TRACT | 0 refills | Status: DC

## 2019-12-29 NOTE — Telephone Encounter (Signed)
Ok to send in Symbicort brand.

## 2019-12-29 NOTE — Telephone Encounter (Signed)
I received a fax from the pts. Pharmacy stating that his Budesonide-Formoterol needs a PA but on the same paper it says that the brand name of the med Symbicort is covered. I wanted to see if you just wanted to send a new prescription for the brand name Symbicort. It was sent to the [facility]. In [place].

## 2019-12-29 NOTE — Telephone Encounter (Signed)
done

## 2020-01-08 ENCOUNTER — Telehealth: Payer: Self-pay

## 2020-01-08 MED ORDER — ACCU-CHEK GUIDE VI STRP: ORAL_STRIP | 2 refills | Status: DC

## 2020-01-08 NOTE — Telephone Encounter (Signed)
Sent to pharmacy 

## 2020-01-08 NOTE — Telephone Encounter (Signed)
Pt. Called stating that he needs a refill on his accu chek test strips to the [facility] on [address] his last apt was 12/11/19.

## 2020-01-15 ENCOUNTER — Telehealth: Payer: Self-pay

## 2020-01-15 NOTE — Progress Notes (Signed)
Virtual Visit via Telephone Note   This visit type was conducted due to national recommendations for restrictions regarding the COVID-19 Pandemic (e.g. social distancing) in an effort to limit this patient's exposure and mitigate transmission in our community.  Due to his co-morbid illnesses, this patient is at least at moderate risk for complications without adequate follow up.  This format is felt to be most appropriate for this patient at this time.  The patient did not have access to video technology/had technical difficulties with video requiring transitioning to audio format only (telephone).  All issues noted in this document were discussed and addressed.  No physical exam could be performed with this format.  Please refer to the patient's chart for his  consent to telehealth for [facility].   The patient was identified using 2 identifiers.  Date:  01/15/2020   ID:  [place], DOB 04/30/1951, MRN [id number]  Patient Location: Home Provider Location: Home  PCP:  Girtha Rm, NP-C  Cardiologist:  Amyla Heffner Martinique MD Electrophysiologist:  None   Evaluation Performed:  Follow-Up Visit  Chief Complaint:  CHF, Afib   History of Present Illness:    Theodore Bush is a 69 y.o. male with a history of atrial fibrillation, CHF and CAD. He reports he has been admitted a couple of times for PNA and at one time had "fluid in the lungs" and required thoracentesis. He has a difficult time remembering when he was hospitalized or any details about prior cardiac work up. States he did have a stress test at some point in the past. We do have records of a chest CT done for a pulmonary nodule that showed severe coronary calcification. No prior history of MI. Thinks he had Afib 2-3 years ago. Initially on coumadin but later switched to Xarelto. 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 He has a history of DM on insulin, HTN, and HLD. Has CKD stage 3.   We requested old  records on his last visit. To date we have received none. An Echo was obtained here showing an EF of 35-40%.Global hypokinesis. Aortic root enlarged to 49 mm. CT chest done showing aneurysm of 4.7 cm. Coronary calcification. Scattered pulmonary nodules. Evidence of cirrhosis.   On follow up today he is doing OK. Notes he hasn't been very active recently and his BP has been running higher up to [id number] systolic. Still drinking 2 drinks a day. States he has cut back. Denies any dyspnea, chest pain, palpitations, dizziness. No edema. Weight is stable. He notes occasional palpitations at night that go away.  The patient does not have symptoms concerning for COVID-19 infection (fever, chills, cough, or new shortness of breath).    Past Medical History:  Diagnosis Date  . 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 Asthma   . Atrial fibrillation ([place]) 09/02/2018  . CHF (congestive heart failure) ([place])   . Controlled diabetes mellitus type 2 with complications ([place]) [id number]  . Coronary artery calcification 06/11/2019  . Hyperlipidemia   . Hypertension    No past surgical history on file.   No outpatient medications have been marked as taking for the 01/16/20 encounter (Appointment) with Martinique, Mija Effertz M, MD.     Allergies:   Quinolones   Social History   Tobacco Use  . Smoking status: Never Smoker  . Smokeless tobacco: Current User    Types: Chew  Substance Use Topics  . Alcohol use: Yes  Alcohol/week: 10.0 standard drinks    Types: 10 Shots of liquor per week    Comment: 10 drinks a week  . Drug use: Yes    Types: Marijuana    Comment: occ     Family Hx: The patient's family history includes Hypertension in his mother; Lupus in his mother.  ROS:   Please see the history of present illness.    All other systems reviewed and are negative.   Prior CV studies:   The following studies were reviewed today:  Echo 06/26/19: IMPRESSIONS   1. Left ventricular ejection  fraction, by visual estimation, is 35 to 40%. The left ventricle has moderately decreased function. Normal left ventricular size. There is mildly increased left ventricular hypertrophy. Diffuse hypokinesis. 2. Left ventricular diastolic Doppler parameters are indeterminate pattern of LV diastolic filling. Rhythm uncertain. 3. Trivial pericardial effusion is present. 4. Moderate mitral annular calcification. 5. The mitral valve is normal in structure. Trace mitral valve regurgitation. No evidence of mitral stenosis. 6. The tricuspid valve is normal in structure. Tricuspid valve regurgitation is trivial. 7. The aortic valve is tricuspid Aortic valve regurgitation is mild by color flow Doppler. Mild to moderate aortic valve sclerosis/calcification without any evidence of aortic stenosis. 8. There is severe dilatation of the ascending aorta measuring 49 mm. Suggest MRA or CTA chest to further evaluate. 9. Global right ventricle has mildly reduced systolic function.The right ventricular size is normal. No increase in right ventricular wall thickness. 10. Left atrial size was moderately dilated. 11. Right atrial size was mildly dilated. 12. The inferior vena cava is normal in size with greater than 50% respiratory variability, suggesting right atrial pressure of 3 mmHg. 13. TR signal is inadequate for assessing pulmonary artery systolic pressure.  Chest CT 07/04/19: IMPRESSION: 1. No acute intrathoracic pathology. No CT evidence of aortic dissection. 2. Dilated ascending aorta measuring up to 4.7 cm in diameter. Ascending thoracic aortic aneurysm. Recommend semi-annual imaging followup by CTA or MRA and referral to cardiothoracic surgery if not already obtained. This recommendation follows 2010 ACCF/AHA/AATS/ACR/ASA/SCA/SCAI/SIR/STS/SVM Guidelines for the Diagnosis and Management of Patients With Thoracic Aortic Disease. Circulation. 2010; [id number]. Aortic aneurysm NOS  (ICD10-I71.9) 3. Cardiomegaly with 3 vessel coronary vascular calcification. 4. Small pericardial effusion. 5. Several small pulmonary nodules measure up to 4 mm. No follow-up needed if patient is low-risk (and has no known or suspected primary neoplasm). Non-contrast chest CT can be considered in 12 months if patient is high-risk. This recommendation follows the consensus statement: Guidelines for Management of Incidental Pulmonary Nodules Detected on CT Images: From the Fleischner Society 2017; Radiology 2017; 284:228-243. 6. Cirrhosis.  Labs/Other Tests and Data Reviewed:    EKG:  No ECG reviewed.  Recent Labs: 06/11/2019: BNP 223.4; TSH 2.060 12/11/2019: ALT 25; BUN 20; Creatinine, Ser 1.30; Hemoglobin 15.4; Platelets 183; Potassium 5.2; Sodium 142   Recent Lipid Panel Lab Results  Component Value Date/Time   CHOL 132 12/11/2019 03:20 PM   TRIG 118 12/11/2019 03:20 PM   HDL 52 12/11/2019 03:20 PM   CHOLHDL 2.5 12/11/2019 03:20 PM   LDLCALC 59 12/11/2019 03:20 PM    Wt Readings from Last 3 Encounters:  12/11/19 206 lb 3.2 oz (93.5 kg)  07/22/19 206 lb (93.4 kg)  06/19/19 212 lb 6.4 oz (96.3 kg)     Objective:    Vital Signs:  There were no vitals taken for this visit.   VITAL SIGNS:  reviewed  ASSESSMENT & PLAN:    1.  CHF. Chronic systolic. EF 123456.  Currently appears  well compensated on aldactone, Coreg, and lisinopril. Also on Jardiance. Suspect LV dysfunction may be related to history of Etoh abuse. Recommend he avoid Etoh. Sodium restriction. Since BP running higher will increase Coreg to 25 mg bid.  2. Atrial fibrillation- paroxysmal. In NSR now. Infrequent symptoms. On chronic Xarelto.  3. CAD. Based on heavy coronary calcification on CT. No active angina. Apparent ischemic work up with stress testing at some point in the past.  4. HLD. On statin with excellent control 5. DM on insulin per primary care.  6. Asthma. Controlled.  7. CKD stage 3. Last  creatinine 1.3.  8. HTN will increase Coreg as noted.  9. Thoracic aortic aneurysm. 4.7 cm. Will need yearly CT in October.  Recommend avoidance of fluoroquinolone antibiotics.  10. History of Etoh abuse. Cirrhosis noted on CT. Recommend abstinence.  COVID-19 Education: The signs and symptoms of COVID-19 were discussed with the patient and how to seek care for testing (follow up with PCP or arrange E-visit).  The importance of social distancing was discussed today.  Time:   Today, I have spent 15 minutes with the patient with telehealth technology discussing the above problems.     Medication Adjustments/Labs and Tests Ordered: Current medicines are reviewed at length with the patient today.  Concerns regarding medicines are outlined above.   Tests Ordered: No orders of the defined types were placed in this encounter.   Medication Changes: No orders of the defined types were placed in this encounter.   Follow Up:  In Person in 6 month(s) with CT of aorta  Signed, Trever Streater Martinique, MD  01/15/2020 5:20 PM    [place]

## 2020-01-15 NOTE — Telephone Encounter (Signed)
  Patient Consent for Virtual Visit         Theodore Bush has provided verbal consent on 01/15/2020 for a virtual visit (video or telephone).   CONSENT FOR VIRTUAL VISIT FOR:  Theodore Bush  By participating in this virtual visit I agree to the following:  I hereby voluntarily request, consent and authorize [facility] and its employed or contracted physicians, physician assistants, nurse practitioners or other licensed health care professionals (the Practitioner), to provide me with telemedicine health care services (the "Services") as deemed necessary by the treating Practitioner. I acknowledge and consent to receive the Services by the Practitioner via telemedicine. I understand that the telemedicine visit will involve communicating with the Practitioner through live audiovisual communication technology and the disclosure of certain medical information by electronic transmission. I acknowledge that I have been given the opportunity to request an in-person assessment or other available alternative prior to the telemedicine visit and am voluntarily participating in the telemedicine visit.  I understand that I have the right to withhold or withdraw my consent to the use of telemedicine in the course of my care at any time, without affecting my right to future care or treatment, and that the Practitioner or I may terminate the telemedicine visit at any time. I understand that I have the right to inspect all information obtained and/or recorded in the course of the telemedicine visit and may receive copies of available information for a reasonable fee.  I understand that some of the potential risks of receiving the Services via telemedicine include:  Marland Kitchen Delay or interruption in medical evaluation due to technological equipment failure or disruption; . Information transmitted may not be sufficient (e.g. poor resolution of images) to allow for appropriate medical decision making by the Practitioner;  and/or  . In rare instances, security protocols could fail, causing a breach of personal health information.  Furthermore, I acknowledge that it is my responsibility to provide information about my medical history, conditions and care that is complete and accurate to the best of my ability. I acknowledge that Practitioner's advice, recommendations, and/or decision may be based on factors not within their control, such as incomplete or inaccurate data provided by me or distortions of diagnostic images or specimens that may result from electronic transmissions. I understand that the practice of medicine is not an exact science and that Practitioner makes no warranties or guarantees regarding treatment outcomes. I acknowledge that a copy of this consent can be made available to me via my patient portal ([facility]), or I can request a printed copy by calling the office of [facility].    I understand that my insurance will be billed for this visit.   I have read or had this consent read to me. . I understand the contents of this consent, which adequately explains the benefits and risks of the Services being provided via telemedicine.  . I have been provided ample opportunity to ask questions regarding this consent and the Services and have had my questions answered to my satisfaction. . I give my informed consent for the services to be provided through the use of telemedicine in my medical care

## 2020-01-16 ENCOUNTER — Encounter: Payer: Self-pay | Admitting: Cardiology

## 2020-01-16 ENCOUNTER — Other Ambulatory Visit: Payer: Self-pay | Admitting: Family Medicine

## 2020-01-16 ENCOUNTER — Telehealth (INDEPENDENT_AMBULATORY_CARE_PROVIDER_SITE_OTHER): Payer: Medicare Other | Admitting: Cardiology

## 2020-01-16 VITALS — BP 130/78 | HR 95 | Ht 69.0 in | Wt 203.0 lb

## 2020-01-16 DIAGNOSIS — I10 Essential (primary) hypertension: Secondary | ICD-10-CM

## 2020-01-16 DIAGNOSIS — I5022 Chronic systolic (congestive) heart failure: Secondary | ICD-10-CM | POA: Diagnosis not present

## 2020-01-16 DIAGNOSIS — I2510 Atherosclerotic heart disease of native coronary artery without angina pectoris: Secondary | ICD-10-CM | POA: Diagnosis not present

## 2020-01-16 DIAGNOSIS — I712 Thoracic aortic aneurysm, without rupture, unspecified: Secondary | ICD-10-CM

## 2020-01-16 DIAGNOSIS — I480 Paroxysmal atrial fibrillation: Secondary | ICD-10-CM

## 2020-01-16 MED ORDER — CARVEDILOL 25 MG PO TABS: 25.0000 mg | ORAL_TABLET | Freq: Two times a day (BID) | ORAL | 3 refills | Status: DC

## 2020-01-16 NOTE — Patient Instructions (Signed)
Medication Instructions:  Increase Coreg 25 mg twice a day Continue all other medications *If you need a refill on your cardiac medications before your next appointment, please call your pharmacy*   Lab Work: Bmet to be done 1 week before Chest CT in Oct   Come to lab at [facility] office.No appointment needed Lab opens 8:00 am to 12:00 noon or 2:00 pm to 4:00 pm    Testing/Procedures: Schedule Chest CT in October   Follow-Up: At [facility], you and your health needs are our priority.  As part of our continuing mission to provide you with exceptional heart care, we have created designated Provider Care Teams.  These Care Teams include your primary Cardiologist (physician) and Advanced Practice Providers (APPs -  Physician Assistants and Nurse Practitioners) who all work together to provide you with the care you need, when you need it.  We recommend signing up for the patient portal called "MyChart".  Sign up information is provided on this After Visit Summary.  MyChart is used to connect with patients for Virtual Visits (Telemedicine).  Patients are able to view lab/test results, encounter notes, upcoming appointments, etc.  Non-urgent messages can be sent to your provider as well.   To learn more about what you can do with MyChart, go to [email or web address].    Your next appointment:  6 months   Call in July to schedule Oct appointment    The format for your next appointment: Office     Provider:  Dr.Jordan

## 2020-01-16 NOTE — Addendum Note (Signed)
Addended by: Kathyrn Lass on: 01/16/2020 09:57 AM   Modules accepted: Orders

## 2020-01-23 ENCOUNTER — Other Ambulatory Visit: Payer: Self-pay | Admitting: Pulmonary Disease

## 2020-01-23 ENCOUNTER — Ambulatory Visit (INDEPENDENT_AMBULATORY_CARE_PROVIDER_SITE_OTHER): Payer: Medicare Other | Admitting: Pulmonary Disease

## 2020-01-23 ENCOUNTER — Encounter: Payer: Self-pay | Admitting: Pulmonary Disease

## 2020-01-23 ENCOUNTER — Other Ambulatory Visit: Payer: Self-pay

## 2020-01-23 VITALS — BP 118/78 | HR 112 | Temp 97.2°F | Ht 69.0 in | Wt 203.6 lb

## 2020-01-23 DIAGNOSIS — R0602 Shortness of breath: Secondary | ICD-10-CM

## 2020-01-23 MED ORDER — MONTELUKAST SODIUM 10 MG PO TABS
10.0000 mg | ORAL_TABLET | Freq: Every day | ORAL | 1 refills | Status: DC
Start: 2020-01-23 — End: 2020-03-16

## 2020-01-23 MED ORDER — ALBUTEROL SULFATE HFA 108 (90 BASE) MCG/ACT IN AERS: 1.0000 | INHALATION_SPRAY | Freq: Four times a day (QID) | RESPIRATORY_TRACT | 5 refills | Status: DC | PRN

## 2020-01-23 NOTE — Patient Instructions (Signed)
Multiple allergies  Continue Symbicort Continue albuterol as needed We are adding Singulair today  I will see you in about 2 months  Call with significant concerns  Avoid known allergens as tolerated Graded exercises as tolerated

## 2020-01-23 NOTE — Progress Notes (Signed)
Theodore Bush    [id number]    [birth year]  Primary Care Physician:Henson, Laurian Brim, NP-C  Referring Physician: Girtha Rm, NP-C [place],  [place] [address]  Chief complaint:   Patient with shortness of breath with activity  HPI:  History of multiple allergies Currently using Symbicort which has helped greatly since he started using it Does use albuterol about twice a week  Allergies to cats, dogs, horses, pollen  Tries to avoid his allergens as tolerated  History of congestive heart failure, atrial fibrillation, rheumatoid arthritis  He is active  Non-smoker   Outpatient Encounter Medications as of 01/23/2020  Medication Sig  . Adalimumab (HUMIRA PEN) 40 MG/0.4ML PNKT Inject 1 each into the skin. 2 times a months.  Marland Kitchen albuterol (PROAIR HFA) 108 (90 Base) MCG/ACT inhaler Inhale 1 puff into the lungs every 6 (six) hours as needed for wheezing or shortness of breath.  Marland Kitchen atorvastatin (LIPITOR) 80 MG tablet TAKE 1 TABLET BY MOUTH EVERY DAY  . carvedilol (COREG) 25 MG tablet Take 1 tablet (25 mg total) by mouth 2 (two) times daily.  . empagliflozin (JARDIANCE) 25 MG TABS tablet Take 25 mg by mouth daily.  . Fenofibrate 50 MG CAPS TAKE 1 CAPSULE(50 MG) BY MOUTH DAILY  . furosemide (LASIX) 20 MG tablet TAKE 1 TABLET BY MOUTH EVERY DAY  . glucose blood (ACCU-CHEK GUIDE) test strip Test 1-2 a day. Pt has accu- chek meter  . HYDROcodone-acetaminophen (NORCO) 10-325 MG tablet Take 1 tablet by mouth every 6 (six) hours as needed.  . insulin glargine (LANTUS SOLOSTAR) 100 UNIT/ML Solostar Pen ADMINISTER 40 UNITS UNDER THE SKIN DAILY  . Insulin Pen Needle (PEN NEEDLES) 32G X 4 MM MISC 1 each by Other route daily.  Marland Kitchen lisinopril (ZESTRIL) 10 MG tablet TAKE 1 TABLET(10 MG) BY MOUTH DAILY  . metFORMIN (GLUCOPHAGE) 1000 MG tablet Take 1 tablet (1,000 mg total) by mouth 2 (two) times daily with a meal.  . spironolactone (ALDACTONE) 25 MG tablet TAKE 1 TABLET BY  MOUTH EVERY 2 DAYS FOR 90 DAYS  . SYMBICORT 160-4.5 MCG/ACT inhaler INHALE 2 PUFFS INTO THE LUNGS TWICE DAILY.  Marland Kitchen XARELTO 20 MG TABS tablet TAKE 1 TABLET(20 MG) BY MOUTH DAILY WITH SUPPER   No facility-administered encounter medications on file as of 01/23/2020.    Allergies as of 01/23/2020 - Review Complete 01/23/2020  Allergen Reaction Noted  . Quinolones  07/07/2019    Past Medical History:  Diagnosis Date  . 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 Asthma   . Atrial fibrillation ([place]) 09/02/2018  . CHF (congestive heart failure) ([place])   . Controlled diabetes mellitus type 2 with complications ([place]) [id number]  . Coronary artery calcification 06/11/2019  . Hyperlipidemia   . Hypertension     History reviewed. No pertinent surgical history.  Family History  Problem Relation Age of Onset  . Lupus Mother   . Hypertension Mother     Social History   Socioeconomic History  . Marital status: Married    Spouse name: Not on file  . Number of children: Not on file  . Years of education: Not on file  . Highest education level: Not on file  Occupational History  . Not on file  Tobacco Use  . Smoking status: Never Smoker  . Smokeless tobacco: Current User    Types: Snuff  Substance and Sexual Activity  . Alcohol use: Yes    Alcohol/week: 10.0  standard drinks    Types: 10 Shots of liquor per week    Comment: 10 drinks a week  . Drug use: Yes    Types: Marijuana    Comment: occ  . Sexual activity: Not on file  Other Topics Concern  . Not on file  Social History Narrative  . Not on file   Social Determinants of Health   Financial Resource Strain:   . Difficulty of Paying Living Expenses:   Food Insecurity:   . Worried About Charity fundraiser in the Last Year:   . Arboriculturist in the Last Year:   Transportation Needs:   . Film/video editor (Medical):   Marland Kitchen Lack of Transportation (Non-Medical):   Physical Activity:   . Days of Exercise per Week:     . Minutes of Exercise per Session:   Stress:   . Feeling of Stress :   Social Connections:   . Frequency of Communication with Friends and Family:   . Frequency of Social Gatherings with Friends and Family:   . Attends Religious Services:   . Active Member of Clubs or Organizations:   . Attends Archivist Meetings:   Marland Kitchen Marital Status:   Intimate Partner Violence:   . Fear of Current or Ex-Partner:   . Emotionally Abused:   Marland Kitchen Physically Abused:   . Sexually Abused:     Review of Systems  Respiratory: Positive for shortness of breath.   All other systems reviewed and are negative.   Vitals:   01/23/20 1140  BP: 118/78  Pulse: (!) 112  Temp: (!) 97.2 F (36.2 C)  SpO2: 95%     Physical Exam  Constitutional: He appears well-developed and well-nourished.  HENT:  Head: Normocephalic and atraumatic.  Eyes: Pupils are equal, round, and reactive to light. Conjunctivae are normal.  Neck: No tracheal deviation present. No thyromegaly present.  Cardiovascular: Normal rate and regular rhythm.  Pulmonary/Chest: Effort normal and breath sounds normal. No respiratory distress. He has no wheezes. He has no rales. He exhibits no tenderness.   Data Reviewed: CT scan of the chest reviewed with the patient Most recent echocardiogram from October 2020 shows reduced ejection fraction of 35 to 40%  Assessment:  Shortness of breath -Multifactorial  Systolic heart failure -On optimal management  Wheezing/bronchospasm -Multiple allergens -Trigger avoidance as tolerated  Plan/Recommendations: We will continue Symbicort  Continue albuterol as needed  Addition of Singulair   Graded exercise as tolerated  I will follow-up with him in about 2 months   Sherrilyn Rist MD  Pulmonary and Critical Care 01/23/2020, 11:54 AM  CC: Girtha Rm, NP-C

## 2020-02-11 ENCOUNTER — Encounter: Payer: Self-pay | Admitting: Family Medicine

## 2020-02-11 ENCOUNTER — Ambulatory Visit (INDEPENDENT_AMBULATORY_CARE_PROVIDER_SITE_OTHER): Payer: Medicare Other | Admitting: Family Medicine

## 2020-02-11 ENCOUNTER — Other Ambulatory Visit: Payer: Self-pay

## 2020-02-11 VITALS — BP 130/80 | HR 72 | Resp 18 | Ht 68.0 in | Wt 202.6 lb

## 2020-02-11 DIAGNOSIS — I4811 Longstanding persistent atrial fibrillation: Secondary | ICD-10-CM

## 2020-02-11 DIAGNOSIS — Z125 Encounter for screening for malignant neoplasm of prostate: Secondary | ICD-10-CM | POA: Diagnosis not present

## 2020-02-11 DIAGNOSIS — R0683 Snoring: Secondary | ICD-10-CM

## 2020-02-11 DIAGNOSIS — I712 Thoracic aortic aneurysm, without rupture: Secondary | ICD-10-CM

## 2020-02-11 DIAGNOSIS — G479 Sleep disorder, unspecified: Secondary | ICD-10-CM

## 2020-02-11 DIAGNOSIS — Z136 Encounter for screening for cardiovascular disorders: Secondary | ICD-10-CM

## 2020-02-11 DIAGNOSIS — N189 Chronic kidney disease, unspecified: Secondary | ICD-10-CM | POA: Diagnosis not present

## 2020-02-11 DIAGNOSIS — Z1159 Encounter for screening for other viral diseases: Secondary | ICD-10-CM | POA: Diagnosis not present

## 2020-02-11 DIAGNOSIS — Z1211 Encounter for screening for malignant neoplasm of colon: Secondary | ICD-10-CM | POA: Diagnosis not present

## 2020-02-11 DIAGNOSIS — I509 Heart failure, unspecified: Secondary | ICD-10-CM

## 2020-02-11 DIAGNOSIS — Z7901 Long term (current) use of anticoagulants: Secondary | ICD-10-CM

## 2020-02-11 DIAGNOSIS — I10 Essential (primary) hypertension: Secondary | ICD-10-CM | POA: Diagnosis not present

## 2020-02-11 DIAGNOSIS — E118 Type 2 diabetes mellitus with unspecified complications: Secondary | ICD-10-CM

## 2020-02-11 DIAGNOSIS — Z794 Long term (current) use of insulin: Secondary | ICD-10-CM | POA: Diagnosis not present

## 2020-02-11 DIAGNOSIS — I7121 Aneurysm of the ascending aorta, without rupture: Secondary | ICD-10-CM

## 2020-02-11 DIAGNOSIS — Z0000 Encounter for general adult medical examination without abnormal findings: Secondary | ICD-10-CM | POA: Diagnosis not present

## 2020-02-11 DIAGNOSIS — R0681 Apnea, not elsewhere classified: Secondary | ICD-10-CM

## 2020-02-11 DIAGNOSIS — Z8669 Personal history of other diseases of the nervous system and sense organs: Secondary | ICD-10-CM

## 2020-02-11 LAB — POCT GLYCOSYLATED HEMOGLOBIN (HGB A1C): Hemoglobin A1C: 6.4 % — AB (ref 4.0–5.6)

## 2020-02-11 NOTE — Patient Instructions (Addendum)
  Theodore Bush , Thank you for taking time to come for your Medicare Wellness Visit. I appreciate your ongoing commitment to your health goals. Please review the following plan we discussed and let me know if I can assist you in the future.   These are the goals we discussed:  Reduce your Lantus dose to 30 units daily. Let me know if you have any more readings less than 90.   Schedule a diabetic eye exam.   You are overdue for your Tdap (Tetanus). You can get this at your local pharmacy.   You will hear [facility] about scheduling to discuss colonoscopy.   You will hear from [facility] about your home sleep test.   I will be in touch with your lab results.        This is a list of the screening recommended for you and due dates:  Health Maintenance  Topic Date Due  .  Hepatitis C: One time screening is recommended by Center for Disease Control  (CDC) for  adults born from [birth year] through 1964.   Never done  . Complete foot exam   Never done  . Eye exam for diabetics  Never done  . Tetanus Vaccine  Never done  . Colon Cancer Screening  Never done  . Pneumonia vaccines (1 of 2 - PCV13) Never done  . Hemoglobin A1C  12/09/2019  . Flu Shot  04/18/2020  . COVID-19 Vaccine  Completed

## 2020-02-11 NOTE — Progress Notes (Signed)
Theodore Bush is a 69 y.o. male who presents for annual wellness visit, CPE and follow-up on chronic medical conditions.  He has the following concerns:  Diabetes- taking metformin 1,000 mg bid, Jardiance 25 mg once daily and Lantus 40 units nightly.  Last A1c 6.6% in 05/2019.  FBS he has had a couple of low readings in the 60s and 70s. Otherwise his FBS has been in the 120s-130s. BS in the evening is 140s  HL- taking atorvastatin 80 mg and Fenofibrate daily without any concerns. Followed by cardiology  HTN- managed by cardiologist no concerns with BP or medications.   CHF- managed by cardiologist. No concerns today with fluid retention or breathing  A-fib and on Xarelto- denies bleeding   Ascending thoracic aneurysm- 4.7 cm. He is aware and avoiding heavy lifting. Denies ever having AAA screening. History included significant years of daily marijuana smoking. States now he does not smoke it, only uses edibles.   States he was diagnosed with sleep apnea 25 years ago in [place].  States he never got a CPAP because he moved and did not follow up on it. States he has issues sleeping. States he has a history of snoring and apnea witnessed by his wife.  Also complains of daytime fatigue and falling asleep. He is under the care of pulmonology for asthma. Denies any recent flares.       Immunization History  Administered Date(s) Administered  . Fluad Quad(high Dose 65+) 06/11/2019  . Moderna SARS-COVID-2 Vaccination 12/15/2019, 01/15/2020   Last colonoscopy: never  Last PSA: unknown Dentist: has dentures on top, last seen dentist 2 years ago Ophtho: about a year ago Exercise: doesn't do much exercise; only yard work and walk dogs daily  Other doctors caring for patient include:  Dr. [place] -cardiology Dr Ander Slade- pulmonologist  Dr. Amil Amen- rheumatologist   States he is up to date on pneumonia and shingles.  Tdap more than 10 years.     Depression screen:  See questionnaire  below.     Depression screen [facility] 2/9 02/11/2020 06/11/2019 03/07/2018  Decreased Interest 0 2 0  Down, Depressed, Hopeless 0 1 0  PHQ - 2 Score 0 3 0  Altered sleeping - 3 -  Tired, decreased energy - 3 -  Change in appetite - 3 -  Feeling bad or failure about yourself  - 0 -  Trouble concentrating - 0 -  Moving slowly or fidgety/restless - 3 -  Suicidal thoughts - 0 -  PHQ-9 Score - 15 -  Difficult doing work/chores - Not difficult at all -    Fall Screen: See Questionaire below.   Fall Risk  02/11/2020 06/11/2019 03/07/2018  Falls in the past year? 0 1 Yes  Number falls in past yr: - 1 -  Injury with Fall? - 1 No    ADL screen:  See questionnaire below.  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 Wife Sondra Barges [place] is his HCPOA.    Review  of Systems  Constitutional: -fever, -chills, -sweats, -unexpected weight change, -anorexia, +fatigue Allergy: -sneezing, -itching, -congestion Dermatology: denies changing moles, rash, lumps, new worrisome lesions ENT: -runny nose, -ear pain, -sore throat, -hoarseness, -sinus pain, -teeth pain, -tinnitus, +hearing loss, -epistaxis Cardiology:  -chest pain, -palpitations, -edema, -orthopnea, -paroxysmal nocturnal dyspnea Respiratory: -cough, -shortness of breath, -dyspnea on exertion, +wheezing, -hemoptysis Gastroenterology: -abdominal pain, -nausea, -vomiting,  -diarrhea, -constipation, -blood in stool, -changes in bowel movement, -dysphagia Hematology: -bleeding or bruising problems Musculoskeletal: -arthralgias, -myalgias, -joint swelling, -back pain, -neck pain, -cramping, -gait changes Ophthalmology: -vision changes, -eye redness, -itching, -discharge Urology: -dysuria, -difficulty urinating, -hematuria, -urinary frequency, -urgency, -incontinence Neurology: -headache, -weakness, -tingling, -numbness, -speech abnormality, -memory loss, -falls, -dizziness Psychology:  -depressed mood, -agitation, +sleep problems   PHYSICAL EXAM:  BP 130/80   Pulse 72   Resp 18   Ht 5\' 8"  (1.727 m)   Wt 202 lb 9.6 oz (91.9 kg)   BMI 30.81 kg/m   General Appearance: Alert, cooperative, no distress, appears stated age Head: Normocephalic, without obvious abnormality, atraumatic Eyes: PERRL, conjunctiva/corneas clear, EOM's intact Ears: Normal TM's and external ear canals Nose: Mask in place  Throat: Lips, mucosa, and tongue normal; teeth and gums normal Neck: Supple, no lymphadenopathy, thyroid:no enlargement/tenderness/nodules; no JVD Back: Spine nontender, no curvature, ROM normal, no CVA tenderness Lungs: Clear to auscultation bilaterally without wheezes, rales or ronchi; respirations unlabored Chest Wall: No tenderness or deformity Heart: Regular rate with irregular rhythm  Breast Exam: No chest wall tenderness, masses or gynecomastia Abdomen: Soft, non-tender, nondistended, normoactive bowel sounds, no palable masses Genitalia: declines  Rectal: declines  Extremities: No clubbing, cyanosis or edema Pulses: 2+ and symmetric all extremities Skin: Skin color, texture, turgor normal, no rashes or lesions Lymph nodes: Cervical, supraclavicular, and axillary nodes normal Neurologic: CNII-XII intact, normal strength, sensation and gait; reflexes 2+ and symmetric throughout   Psych: Normal mood, affect, hygiene and grooming  ASSESSMENT/PLAN: Medicare  annual wellness visit, subsequent -Here today for Medicare wellness visit.  He denies falls, depression, difficulties with ADLs or memory concerns.  Advanced directives discussed.  MOST form filled out.  Medications reviewed.  Routine general medical examination at a health care facility -Preventive health care reviewed and updated.  GI referral made.  AAA screening done per guidelines.  Immunizations reviewed.  Recommend he go to his local pharmacy to get the Tdap.  Counseling on healthy lifestyle including diet exercise, limiting alcohol and marijuana.  Discussed safety and health promotion.  Controlled type 2 diabetes mellitus with complication, with long-term current use of insulin (HCC) - Plan: HgB 123456, Basic metabolic panel, Microalbumin / creatinine urine ratio -Recent low blood sugars and hemoglobin A1c of 6.4% today.  We will reduce his Lantus dose to 30 units daily and he will report back if he has any blood sugars lower than 90.  For now he will continue on Metformin and Jardiance.  Chronic congestive heart failure, unspecified heart failure type ([place]) - Plan: Basic metabolic panel -Euvolemic today.  Continue Lasix as well as other medications and follow-up with cardiology as scheduled  Essential hypertension - Plan: Basic metabolic panel -Blood pressure appears well controlled.  Continue on current medications and follow-up with cardiology.  Chronic kidney disease, unspecified CKD stage - Plan: Basic metabolic panel -Discussed good control of his hypertension, prevent worsening CKD.  Check labs in follow-up.  Snoring - Plan: Home sleep test -History of sleep apnea, untreated due to moving before getting his equipment.  I will order a  home sleep test to evaluate for OSA.  History of sleep apnea - Plan: Home sleep test -Reports history of diagnosed sleep apnea but due to moving before he could get his CPAP machine, his sleep apnea has been untreated.  I will order a home sleep test to  evaluate whether he currently has sleep apnea and if so, he is aware that we will need to treat this to prevent worsening health.  Sleep disturbance - Plan: Home sleep test -See above note regarding sleep apnea.  Witnessed episode of apnea - Plan: Home sleep test -See above note regarding sleep apnea.  Screen for colon cancer - Plan: Ambulatory referral to Gastroenterology -Referral made per guidelines.  Screening for prostate cancer - Plan: PSA -Screening done per guidelines.  Need for hepatitis C screening test - Plan: Hepatitis C antibody -Screening done per guidelines  Chronic anticoagulation -Appears to be doing well on anticoagulant and no bleeding concerns.  Longstanding persistent atrial fibrillation (HCC) -Anticoagulated, asymptomatic.  Followed by cardiology.  Screening for AAA (abdominal aortic aneurysm) - Plan: VAS US AORTA MEDICARE SCREEN -Done per guidelines.  Thoracic ascending aortic aneurysm ([place]) - Plan: VAS US AORTA MEDICARE SCREEN -AAA screening ordered and he is aware of his current thoracic ascending aneurysm.  Recommend avoiding heavy lifting.  We will also need to avoid quinolones     Discussed PSA screening (risks/benefits), recommended at least 30 minutes of aerobic activity at least 5 days/week; proper sunscreen use reviewed; healthy diet and alcohol recommendations (less than or equal to 2 drinks/day) reviewed; regular seatbelt use; changing batteries in smoke detectors. Immunization recommendations discussed.  Colonoscopy recommendations reviewed.   Medicare Attestation I have personally reviewed: The patient's medical and social history Their use of alcohol, tobacco or illicit drugs Their current medications and supplements The patient's functional ability including ADLs,fall risks, home safety risks, cognitive, and hearing and visual impairment Diet and physical activities Evidence for depression or mood disorders  The patient's weight,  height, and BMI have been recorded in the chart.  I have made referrals, counseling, and provided education to the patient based on review of the above and I have provided the patient with a written personalized care plan for preventive services.     Harland Dingwall, NP-C   02/11/2020

## 2020-02-12 ENCOUNTER — Other Ambulatory Visit: Payer: Self-pay | Admitting: Family Medicine

## 2020-02-12 LAB — BASIC METABOLIC PANEL
BUN/Creatinine Ratio: 17 (ref 10–24)
BUN: 24 mg/dL (ref 8–27)
CO2: 27 mmol/L (ref 20–29)
Calcium: 9.5 mg/dL (ref 8.6–10.2)
Chloride: 97 mmol/L (ref 96–106)
Creatinine, Ser: 1.42 mg/dL — ABNORMAL HIGH (ref 0.76–1.27)
GFR calc Af Amer: 59 mL/min/{1.73_m2} — ABNORMAL LOW (ref >59)
GFR calc non Af Amer: 51 mL/min/{1.73_m2} — ABNORMAL LOW (ref >59)
Glucose: 142 mg/dL — ABNORMAL HIGH (ref 65–99)
Potassium: 4.8 mmol/L (ref 3.5–5.2)
Sodium: 138 mmol/L (ref 134–144)

## 2020-02-12 LAB — MICROALBUMIN / CREATININE URINE RATIO
Creatinine, Urine: 74.5 mg/dL
Microalb/Creat Ratio: 10 mg/g creat (ref 0–29)
Microalbumin, Urine: 7.1 ug/mL

## 2020-02-12 LAB — PSA: Prostate Specific Ag, Serum: 0.2 ng/mL (ref 0.0–4.0)

## 2020-02-12 LAB — HEPATITIS C ANTIBODY: Hep C Virus Ab: <0.1 s/co ratio (ref 0.0–0.9)

## 2020-02-12 NOTE — Telephone Encounter (Signed)
Pt was advised to get this from cardiology. He will call them

## 2020-02-18 ENCOUNTER — Other Ambulatory Visit: Payer: Self-pay

## 2020-02-18 MED ORDER — FUROSEMIDE 20 MG PO TABS: 20.0000 mg | ORAL_TABLET | Freq: Every day | ORAL | 0 refills | Status: DC

## 2020-02-20 ENCOUNTER — Ambulatory Visit: Payer: 59

## 2020-02-23 ENCOUNTER — Other Ambulatory Visit: Payer: Self-pay | Admitting: Family Medicine

## 2020-02-24 ENCOUNTER — Other Ambulatory Visit: Payer: Self-pay | Admitting: Family Medicine

## 2020-02-26 ENCOUNTER — Ambulatory Visit
Admission: RE | Admit: 2020-02-26 | Discharge: 2020-02-26 | Disposition: A | Discharge status: Home / Self Care | Payer: Medicare Other | Source: Ambulatory Visit | Attending: Family Medicine | Admitting: Family Medicine

## 2020-02-26 DIAGNOSIS — I7121 Aneurysm of the ascending aorta, without rupture: Secondary | ICD-10-CM

## 2020-02-26 DIAGNOSIS — Z136 Encounter for screening for cardiovascular disorders: Secondary | ICD-10-CM

## 2020-03-03 ENCOUNTER — Other Ambulatory Visit: Payer: Self-pay | Admitting: Family Medicine

## 2020-03-09 ENCOUNTER — Encounter: Payer: Self-pay | Admitting: Physician Assistant

## 2020-03-09 ENCOUNTER — Ambulatory Visit (INDEPENDENT_AMBULATORY_CARE_PROVIDER_SITE_OTHER): Payer: Medicare Other | Admitting: Physician Assistant

## 2020-03-09 ENCOUNTER — Telehealth: Payer: Self-pay

## 2020-03-09 VITALS — BP 104/76 | HR 64 | Ht 68.5 in | Wt 200.0 lb

## 2020-03-09 DIAGNOSIS — Z1211 Encounter for screening for malignant neoplasm of colon: Secondary | ICD-10-CM | POA: Diagnosis not present

## 2020-03-09 DIAGNOSIS — Z7901 Long term (current) use of anticoagulants: Secondary | ICD-10-CM

## 2020-03-09 DIAGNOSIS — Z1212 Encounter for screening for malignant neoplasm of rectum: Secondary | ICD-10-CM | POA: Diagnosis not present

## 2020-03-09 DIAGNOSIS — I480 Paroxysmal atrial fibrillation: Secondary | ICD-10-CM

## 2020-03-09 MED ORDER — NA SULFATE-K SULFATE-MG SULF 17.5-3.13-1.6 GM/177ML PO SOLN: 1.0000 | Freq: Once | ORAL | 0 refills | Status: AC

## 2020-03-09 NOTE — Telephone Encounter (Signed)
Does pt need to schedule a clearance appointment with his pcp?

## 2020-03-09 NOTE — Telephone Encounter (Signed)
[facility] Pre-operative Risk Assessment     Request for surgical clearance:     Endoscopy Procedure  What type of surgery is being performed?     Colonoscopy   When is this surgery scheduled?     03/26/20  What type of clearance is required ?   Pharmacy  Are there any medications that need to be held prior to surgery and how long? Xarelto starting 2 days prior  Practice name and name of physician performing surgery?      [facility]  What is your office phone and fax number?      Phone- [phone]  [phone]  Anesthesia type (None, local, MAC, general) ?       MAC

## 2020-03-09 NOTE — Telephone Encounter (Signed)
He said his PCP was in charge of his Xarelto and the only clearance we need is that it is okay to stop it for 2 days for his Colonoscopy.

## 2020-03-09 NOTE — Patient Instructions (Signed)
If you are age 68 or older, your body mass index should be between 23-30. Your Body mass index is 29.97 kg/m. If this is out of the aforementioned range listed, please consider follow up with your Primary Care Provider.  If you are age 28 or younger, your body mass index should be between 19-25. Your Body mass index is 29.97 kg/m. If this is out of the aformentioned range listed, please consider follow up with your Primary Care Provider.   You have been scheduled for a colonoscopy. Please follow written instructions given to you at your visit today.  Please pick up your prep supplies at the pharmacy within the next 1-3 days. If you use inhalers (even only as needed), please bring them with you on the day of your procedure.  You will be contacted by our office prior to your procedure for directions on holding your Xarelto.  If you do not hear from our office 1 week prior to your scheduled procedure, please call [phone] to discuss.   You will be scheduled for a follow up pending the results of your Colonoscopy.

## 2020-03-09 NOTE — Progress Notes (Signed)
Agree with the assessment and plan as outlined by Jennifer Lemmon, PA-C. ? ?Jaylen Knope, DO, FACG ? ?

## 2020-03-09 NOTE — Telephone Encounter (Signed)
Ok to stop Xarelto 2 days prior to procedure and the restart will be determined by GI based on their findings and recommendations. Thanks.

## 2020-03-09 NOTE — Telephone Encounter (Signed)
Ok to stop xarelto for the procedure

## 2020-03-09 NOTE — Telephone Encounter (Signed)
Ok to stop xarelto

## 2020-03-09 NOTE — Telephone Encounter (Signed)
Patient notified that he is stop his Xarelt on 7/7 and restart on 7/9 after his Colonoscopy. He expressed understanding and stated stop date back to me.

## 2020-03-09 NOTE — Progress Notes (Signed)
Chief Complaint: Screening for colorectal cancer  HPI:    Theodore Bush is a 69 year old male with a past medical history as listed below including A. fib and CHF on Xarelto, who was referred to me by Girtha Rm, NP-C for a preprocedure visit for screening colonoscopy.      06/26/2019 echo with LVEF 35-40%.    Today, the patient tells me that he is doing well, he and his wife just moved to the area to be closer to his grandchildren.  Tells me that overall he feels well and has no GI complaints at all.    Denies fever, chills, weight loss, change in bowel habits, heartburn, reflux or symptoms that awaken him from sleep.  Past Medical History:  Diagnosis Date  . 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 Asthma   . Atrial fibrillation ([place]) 09/02/2018  . CHF (congestive heart failure) ([place])   . Controlled diabetes mellitus type 2 with complications ([place]) 24/26/8341  . Coronary artery calcification 06/11/2019  . Hyperlipidemia   . Hypertension   . Pneumonia 2019    History reviewed. No pertinent surgical history.  Current Outpatient Medications  Medication Sig Dispense Refill  . Adalimumab (HUMIRA PEN) 40 MG/0.4ML PNKT Inject 1 each into the skin. 2 times a months.    Marland Kitchen albuterol (PROAIR HFA) 108 (90 Base) MCG/ACT inhaler Inhale 1 puff into the lungs every 6 (six) hours as needed for wheezing or shortness of breath. 18 g 5  . atorvastatin (LIPITOR) 80 MG tablet TAKE 1 TABLET BY MOUTH EVERY DAY 90 tablet 1  . carvedilol (COREG) 25 MG tablet Take 1 tablet (25 mg total) by mouth 2 (two) times daily. 180 tablet 3  . Fenofibrate 50 MG CAPS TAKE 1 CAPSULE(50 MG) BY MOUTH DAILY 90 capsule 1  . furosemide (LASIX) 20 MG tablet Take 1 tablet (20 mg total) by mouth daily. 90 tablet 0  . glucose blood (ACCU-CHEK GUIDE) test strip Test 1-2 a day. Pt has accu- chek meter 100 each 2  . HYDROcodone-acetaminophen (NORCO) 10-325 MG tablet Take 1 tablet by mouth every 6 (six) hours as needed.    .  Insulin Pen Needle (PEN NEEDLES) 32G X 4 MM MISC 1 each by Other route daily. 100 each 2  . JARDIANCE 25 MG TABS tablet TAKE 1 TABLET BY MOUTH EVERY DAY 90 tablet 1  . LANTUS SOLOSTAR 100 UNIT/ML Solostar Pen ADMINISTER 40 UNITS UNDER THE SKIN DAILY (Patient taking differently: ADMINISTER 30 UNITS UNDER THE SKIN DAILY) 15 mL 1  . lisinopril (ZESTRIL) 10 MG tablet TAKE 1 TABLET(10 MG) BY MOUTH DAILY 30 tablet 2  . metFORMIN (GLUCOPHAGE) 1000 MG tablet TAKE 1 TABLET(1000 MG) BY MOUTH TWICE DAILY WITH A MEAL 180 tablet 0  . montelukast (SINGULAIR) 10 MG tablet Take 1 tablet (10 mg total) by mouth at bedtime. 30 tablet 1  . spironolactone (ALDACTONE) 25 MG tablet TAKE 1 TABLET BY MOUTH EVERY 2 DAYS FOR 90 DAYS (Patient taking differently: Take 25 mg by mouth daily. ) 45 tablet 1  . SYMBICORT 160-4.5 MCG/ACT inhaler INHALE 2 PUFFS INTO THE LUNGS TWICE DAILY. 10.2 g 1  . XARELTO 20 MG TABS tablet TAKE 1 TABLET(20 MG) BY MOUTH DAILY WITH SUPPER 90 tablet 3   No current facility-administered medications for this visit.    Allergies as of 03/09/2020 - Review Complete 03/09/2020  Allergen Reaction Noted  . Quinolones  07/07/2019    Family History  Problem Relation Age of Onset  .  Lupus Mother   . Hypertension Mother     Social History   Socioeconomic History  . Marital status: Married    Spouse name: Not on file  . Number of children: Not on file  . Years of education: Not on file  . Highest education level: Not on file  Occupational History  . Not on file  Tobacco Use  . Smoking status: Never Smoker  . Smokeless tobacco: Current User    Types: Snuff  Vaping Use  . Vaping Use: Never used  Substance and Sexual Activity  . Alcohol use: Yes    Alcohol/week: 10.0 standard drinks    Types: 10 Shots of liquor per week    Comment: 10 drinks a week  . Drug use: Yes    Types: Marijuana    Comment: occ  . Sexual activity: Not on file  Other Topics Concern  . Not on file  Social History  Narrative  . Not on file   Social Determinants of Health   Financial Resource Strain:   . Difficulty of Paying Living Expenses:   Food Insecurity:   . Worried About Charity fundraiser in the Last Year:   . Arboriculturist in the Last Year:   Transportation Needs:   . Film/video editor (Medical):   Marland Kitchen Lack of Transportation (Non-Medical):   Physical Activity:   . Days of Exercise per Week:   . Minutes of Exercise per Session:   Stress:   . Feeling of Stress :   Social Connections:   . Frequency of Communication with Friends and Family:   . Frequency of Social Gatherings with Friends and Family:   . Attends Religious Services:   . Active Member of Clubs or Organizations:   . Attends Archivist Meetings:   Marland Kitchen Marital Status:   Intimate Partner Violence:   . Fear of Current or Ex-Partner:   . Emotionally Abused:   Marland Kitchen Physically Abused:   . Sexually Abused:     Review of Systems:    Constitutional: No weight loss, fever or chills Skin: No rash  Cardiovascular: No chest pain  Respiratory: No SOB  Gastrointestinal: See HPI and otherwise negative Genitourinary: No dysuria  Neurological: No headache, dizziness or syncope Musculoskeletal: No new muscle or joint pain Hematologic: No bleeding  Psychiatric: No history of depression or anxiety   Physical Exam:  Vital signs: BP 104/76   Pulse 64   Ht 5' 8.5" (1.74 m)   Wt 200 lb (90.7 kg)   SpO2 98%   BMI 29.97 kg/m   Constitutional:   Pleasant Caucasian male appears to be in NAD, Well developed, Well nourished, alert and cooperative Head:  Normocephalic and atraumatic. Eyes:   PEERL, EOMI. No icterus. Conjunctiva pink. Ears:  Normal auditory acuity. Neck:  Supple Throat: Oral cavity and pharynx without inflammation, swelling or lesion.  Respiratory: Respirations even and unlabored. Lungs clear to auscultation bilaterally.   No wheezes, crackles, or rhonchi.  Cardiovascular: Normal S1, S2. No MRG. Regular  rate and rhythm. No peripheral edema, cyanosis or pallor.  Gastrointestinal:  Soft, nondistended, nontender. No rebound or guarding. Normal bowel sounds. No appreciable masses or hepatomegaly. Rectal:  Not performed.  Msk:  Symmetrical without gross deformities. Without edema, no deformity or joint abnormality.  Neurologic:  Alert and  oriented x4;  grossly normal neurologically.  Skin:   Dry and intact without significant lesions or rashes. Psychiatric: Demonstrates good judgement and reason without abnormal affect  or behaviors.  RELEVANT LABS AND IMAGING: CBC    Component Value Date/Time   WBC 5.5 12/11/2019 1520   WBC 6.4 08/14/2017 1657   RBC 4.95 12/11/2019 1520   RBC 5.36 08/14/2017 1657   HGB 15.4 12/11/2019 1520   HCT 44.6 12/11/2019 1520   PLT 183 12/11/2019 1520   MCV 90 12/11/2019 1520   MCH 31.1 12/11/2019 1520   MCH 30.4 08/14/2017 1657   MCHC 34.5 12/11/2019 1520   MCHC 34.8 08/14/2017 1657   RDW 13.6 12/11/2019 1520   LYMPHSABS 2.2 12/11/2019 1520   EOSABS 0.4 12/11/2019 1520   BASOSABS 0.0 12/11/2019 1520    CMP     Component Value Date/Time   NA 138 02/11/2020 1442   K 4.8 02/11/2020 1442   CL 97 02/11/2020 1442   CO2 27 02/11/2020 1442   GLUCOSE 142 (H) 02/11/2020 1442   GLUCOSE 190 (H) 08/14/2017 1657   BUN 24 02/11/2020 1442   CREATININE 1.42 (H) 02/11/2020 1442   CREATININE 1.36 (H) 08/14/2017 1657   CALCIUM 9.5 02/11/2020 1442   PROT 7.6 12/11/2019 1520   ALBUMIN 4.6 12/11/2019 1520   AST 31 12/11/2019 1520   ALT 25 12/11/2019 1520   ALKPHOS 32 (L) 12/11/2019 1520   BILITOT 0.5 12/11/2019 1520   GFRNONAA 51 (L) 02/11/2020 1442   GFRAA 59 (L) 02/11/2020 1442    Assessment: 1.  Screening for colorectal cancer: Patient has never had screening for colorectal cancer 2.  Chronic anticoagulation for A. fib: On Xarelto  Plan: 1.  Schedule patient for colonoscopy in the [place] with Dr. Bryan Lemma.  2.  Patient was advised to hold his Xarelto for 2  days.  Benefits and risks of the procedure were explained including risk of bleeding, perforation, infection, missed lesions, reactions to medications and possible need for hospitalization and surgery for complications.  Additional rare but real risk of stroke or other vascular clotting events off of Xarelto also explained, it was recommended to seek urgent help if any signs of these problems occur.  We will communicate with his referring physician via phone or EMR to ensure that holding his Xarelto is reasonable for him. 3.  Patient functioning per recommendations from Dr. Bryan Lemma after time procedure.  Ellouise Newer, PA-C [facility] 03/09/2020, 11:39 AM  Cc: Henson, Vickie L, NP-C

## 2020-03-15 ENCOUNTER — Other Ambulatory Visit: Payer: Self-pay | Admitting: Family Medicine

## 2020-03-16 ENCOUNTER — Other Ambulatory Visit: Payer: Self-pay | Admitting: Pulmonary Disease

## 2020-03-17 ENCOUNTER — Encounter: Payer: Self-pay | Admitting: Gastroenterology

## 2020-03-24 ENCOUNTER — Telehealth: Payer: Self-pay | Admitting: Internal Medicine

## 2020-03-24 NOTE — Telephone Encounter (Signed)
He may continue Xarelto except to stop it for his colonoscopy as recommended. Kidney function is stable. We will need to continue to monitor.

## 2020-03-24 NOTE — Telephone Encounter (Signed)
Pt does not see a kidney specialist for kidney disease. He states he is having a colonscopy Friday and was told to stop the xarelto. Please advise.

## 2020-03-24 NOTE — Telephone Encounter (Signed)
Pt was notified of results

## 2020-03-26 ENCOUNTER — Other Ambulatory Visit: Payer: Self-pay

## 2020-03-26 ENCOUNTER — Ambulatory Visit (AMBULATORY_SURGERY_CENTER): Payer: Medicare Other | Admitting: Gastroenterology

## 2020-03-26 ENCOUNTER — Encounter: Payer: Self-pay | Admitting: Gastroenterology

## 2020-03-26 VITALS — BP 132/89 | HR 64 | Temp 95.6°F | Resp 17 | Ht 68.0 in | Wt 200.0 lb

## 2020-03-26 DIAGNOSIS — K5730 Diverticulosis of large intestine without perforation or abscess without bleeding: Secondary | ICD-10-CM

## 2020-03-26 DIAGNOSIS — D129 Benign neoplasm of anus and anal canal: Secondary | ICD-10-CM

## 2020-03-26 DIAGNOSIS — Z1211 Encounter for screening for malignant neoplasm of colon: Secondary | ICD-10-CM | POA: Diagnosis not present

## 2020-03-26 DIAGNOSIS — D124 Benign neoplasm of descending colon: Secondary | ICD-10-CM

## 2020-03-26 DIAGNOSIS — Z1212 Encounter for screening for malignant neoplasm of rectum: Secondary | ICD-10-CM

## 2020-03-26 DIAGNOSIS — K640 First degree hemorrhoids: Secondary | ICD-10-CM

## 2020-03-26 DIAGNOSIS — D122 Benign neoplasm of ascending colon: Secondary | ICD-10-CM | POA: Diagnosis not present

## 2020-03-26 DIAGNOSIS — D125 Benign neoplasm of sigmoid colon: Secondary | ICD-10-CM

## 2020-03-26 DIAGNOSIS — D128 Benign neoplasm of rectum: Secondary | ICD-10-CM

## 2020-03-26 DIAGNOSIS — D127 Benign neoplasm of rectosigmoid junction: Secondary | ICD-10-CM

## 2020-03-26 MED ORDER — SODIUM CHLORIDE 0.9 % IV SOLN: 500.0000 mL | Freq: Once | INTRAVENOUS | Status: DC

## 2020-03-26 NOTE — Progress Notes (Signed)
Called to room to assist during endoscopic procedure.  Patient ID and intended procedure confirmed with present staff. Received instructions for my participation in the procedure from the performing physician.  

## 2020-03-26 NOTE — Patient Instructions (Signed)
Handouts on polyps, hemorrhoids, and diverticulosis given to you today  Await pathology results Restart Xarelto in 2 days     YOU HAD AN ENDOSCOPIC PROCEDURE TODAY AT [place]:   Refer to the procedure report that was given to you for any specific questions about what was found during the examination.  If the procedure report does not answer your questions, please call your gastroenterologist to clarify.  If you requested that your care partner not be given the details of your procedure findings, then the procedure report has been included in a sealed envelope for you to review at your convenience later.  YOU SHOULD EXPECT: Some feelings of bloating in the abdomen. Passage of more gas than usual.  Walking can help get rid of the air that was put into your GI tract during the procedure and reduce the bloating. If you had a lower endoscopy (such as a colonoscopy or flexible sigmoidoscopy) you may notice spotting of blood in your stool or on the toilet paper. If you underwent a bowel prep for your procedure, you may not have a normal bowel movement for a few days.  Please Note:  You might notice some irritation and congestion in your nose or some drainage.  This is from the oxygen used during your procedure.  There is no need for concern and it should clear up in a day or so.  SYMPTOMS TO REPORT IMMEDIATELY:   Following lower endoscopy (colonoscopy or flexible sigmoidoscopy):  Excessive amounts of blood in the stool  Significant tenderness or worsening of abdominal pains  Swelling of the abdomen that is new, acute  Fever of 100F or higher  For urgent or emergent issues, a gastroenterologist can be reached at any hour by calling [phone]. Do not use MyChart messaging for urgent concerns.    DIET:  We do recommend a small meal at first, but then you may proceed to your regular diet.  Drink plenty of fluids but you should avoid alcoholic beverages for 24  hours.  ACTIVITY:  You should plan to take it easy for the rest of today and you should NOT DRIVE or use heavy machinery until tomorrow (because of the sedation medicines used during the test).    FOLLOW UP: Our staff will call the number listed on your records 48-72 hours following your procedure to check on you and address any questions or concerns that you may have regarding the information given to you following your procedure. If we do not reach you, we will leave a message.  We will attempt to reach you two times.  During this call, we will ask if you have developed any symptoms of COVID 19. If you develop any symptoms (ie: fever, flu-like symptoms, shortness of breath, cough etc.) before then, please call [phone].  If you test positive for Covid 19 in the 2 weeks post procedure, please call and report this information to [place].    If any biopsies were taken you will be contacted by phone or by letter within the next 1-3 weeks.  Please call us at [phone] if you have not heard about the biopsies in 3 weeks.    SIGNATURES/CONFIDENTIALITY: You and/or your care partner have signed paperwork which will be entered into your electronic medical record.  These signatures attest to the fact that that the information above on your After Visit Summary has been reviewed and is understood.  Full responsibility of the confidentiality of this discharge information lies with  you and/or your care-partner.

## 2020-03-26 NOTE — Progress Notes (Signed)
VS-WR

## 2020-03-26 NOTE — Op Note (Signed)
[name] Procedure Date: 03/26/2020 11:15 AM MRN: [id number] Endoscopist: Gerrit Heck , MD Age: 69 Referring MD:  Date of Birth: [birth year] Gender: Male Account #: [id number] Procedure:                Colonoscopy Indications:              Screening for colorectal malignant neoplasm, This                            is the patient's first colonoscopy Medicines:                Monitored Anesthesia Care Procedure:                Pre-Anesthesia Assessment:                           - Prior to the procedure, a History and Physical                            was performed, and patient medications and                            allergies were reviewed. The patient's tolerance of                            previous anesthesia was also reviewed. The risks                            and benefits of the procedure and the sedation                            options and risks were discussed with the patient.                            All questions were answered, and informed consent                            was obtained. Prior Anticoagulants: The patient has                            taken no previous anticoagulant or antiplatelet                            agents. ASA Grade Assessment: III - A patient with                            severe systemic disease. After reviewing the risks                            and benefits, the patient was deemed in                            satisfactory condition to undergo the procedure.  After obtaining informed consent, the colonoscope                            was passed under direct vision. Throughout the                            procedure, the patient's blood pressure, pulse, and                            oxygen saturations were monitored continuously. The                            Colonoscope was introduced through the anus and                            advanced to the the  terminal ileum. The colonoscopy                            was performed without difficulty. The patient                            tolerated the procedure well. The quality of the                            bowel preparation was good. The terminal ileum,                            ileocecal valve, appendiceal orifice, and rectum                            were photographed. Scope In: 11:31:29 AM Scope Out: 12:10:42 PM Scope Withdrawal Time: 0 hours 36 minutes 33 seconds  Total Procedure Duration: 0 hours 39 minutes 13 seconds  Findings:                 The perianal and digital rectal examinations were                            normal.                           Nine sessile polyps were found in the sigmoid colon                            (2), descending colon (1) and ascending colon (6).                            The polyps were 3 to 6 mm in size. These polyps                            were removed with a cold snare. Resection and                            retrieval were complete. Estimated blood loss was  minimal.                           A 8 mm polyp was found in the recto-sigmoid colon.                            The polyp was pedunculated. The polyp was removed                            with a hot snare. Resection and retrieval were                            complete. Estimated blood loss: none.                           A 8 mm polyp was found in the distal rectum. The                            polyp was sessile. The polyp was removed with a                            cold snare. Resection and retrieval were complete.                            Threre was persistent oozing at the polypectomy                            site. For hemostasis, one hemostatic clip was                            successfully placed. There was no bleeding at the                            end of the procedure.                           A few small-mouthed diverticula were  found in the                            sigmoid colon and ascending colon.                           Non-bleeding internal hemorrhoids were found during                            retroflexion. The hemorrhoids were small and Grade                            I (internal hemorrhoids that do not prolapse).                           The terminal ileum appeared normal. Complications:            No immediate complications. Estimated Blood Loss:  Estimated blood loss was minimal. Impression:               - Nine 3 to 6 mm polyps in the sigmoid colon, in                            the descending colon and in the ascending colon,                            removed with a cold snare. Resected and retrieved.                           - One 8 mm polyp at the recto-sigmoid colon,                            removed with a hot snare. Resected and retrieved.                           - One 8 mm polyp in the distal rectum, removed with                            a cold snare. Resected and retrieved. Clip was                            placed.                           - Diverticulosis in the sigmoid colon and in the                            ascending colon.                           - Non-bleeding internal hemorrhoids.                           - The examined portion of the ileum was normal. Recommendation:           - Patient has a contact number available for                            emergencies. The signs and symptoms of potential                            delayed complications were discussed with the                            patient. Return to normal activities tomorrow.                            Written discharge instructions were provided to the                            patient.                           -  Resume previous diet.                           - Continue present medications.                           - Await pathology results.                           - Repeat colonoscopy  for surveillance based on                            pathology results. Gerrit Heck, MD 03/26/2020 12:18:35 PM

## 2020-03-26 NOTE — Progress Notes (Signed)
Report to PACU, RN, vss, BBS= Clear.  

## 2020-03-30 ENCOUNTER — Telehealth: Payer: Self-pay

## 2020-03-30 NOTE — Telephone Encounter (Signed)
Called [phone] and the line was busy x2 and was unable to leave a message we tried to reach pt for a follow up call. maw

## 2020-03-30 NOTE — Telephone Encounter (Signed)
2nd follow up call made.  NAULM 

## 2020-04-08 ENCOUNTER — Encounter: Payer: Self-pay | Admitting: Gastroenterology

## 2020-04-08 ENCOUNTER — Other Ambulatory Visit: Payer: Self-pay | Admitting: Family Medicine

## 2020-04-12 ENCOUNTER — Other Ambulatory Visit: Payer: Self-pay

## 2020-04-12 ENCOUNTER — Encounter: Payer: Self-pay | Admitting: Pulmonary Disease

## 2020-04-12 ENCOUNTER — Ambulatory Visit (INDEPENDENT_AMBULATORY_CARE_PROVIDER_SITE_OTHER): Payer: Medicare Other | Admitting: Pulmonary Disease

## 2020-04-12 VITALS — BP 128/74 | HR 72 | Temp 98.4°F | Ht 68.0 in | Wt 205.0 lb

## 2020-04-12 DIAGNOSIS — R0602 Shortness of breath: Secondary | ICD-10-CM

## 2020-04-12 NOTE — Progress Notes (Signed)
Fontaine Hehl    [id number]    [birth year]  Primary Care Physician:Henson, Laurian Brim, NP-C  Referring Physician: Girtha Rm, NP-C [place],  [place] [id number]  Chief complaint:   Patient with shortness of breath with activity  HPI:  Feels better with Symbicort Compliant with Symbicort use Decreased use of albuterol  History of multiple allergies  Allergies to cats, dogs, horses, pollen  Tries to avoid his allergens as tolerated  History of congestive heart failure, atrial fibrillation, rheumatoid arthritis  He is active  Non-smoker  Recently had colonoscopy, multiple polyps removed Negative for malignancy   Outpatient Encounter Medications as of 04/12/2020  Medication Sig  . Adalimumab (HUMIRA PEN) 40 MG/0.4ML PNKT Inject 1 each into the skin. 2 times a months.  Marland Kitchen albuterol (PROAIR HFA) 108 (90 Base) MCG/ACT inhaler Inhale 1 puff into the lungs every 6 (six) hours as needed for wheezing or shortness of breath.  Marland Kitchen atorvastatin (LIPITOR) 80 MG tablet TAKE 1 TABLET BY MOUTH EVERY DAY  . carvedilol (COREG) 25 MG tablet Take 1 tablet (25 mg total) by mouth 2 (two) times daily.  . Fenofibrate 50 MG CAPS TAKE 1 CAPSULE(50 MG) BY MOUTH DAILY  . furosemide (LASIX) 20 MG tablet Take 1 tablet (20 mg total) by mouth daily.  Marland Kitchen glucose blood (ACCU-CHEK GUIDE) test strip Test 1-2 a day. Pt has accu- chek meter  . HYDROcodone-acetaminophen (NORCO) 10-325 MG tablet Take 1 tablet by mouth every 6 (six) hours as needed.  . Insulin Pen Needle (PEN NEEDLES) 32G X 4 MM MISC 1 each by Other route daily.  Marland Kitchen JARDIANCE 25 MG TABS tablet TAKE 1 TABLET BY MOUTH EVERY DAY  . LANTUS SOLOSTAR 100 UNIT/ML Solostar Pen ADMINISTER 40 UNITS UNDER THE SKIN DAILY (Patient taking differently: ADMINISTER 30 UNITS UNDER THE SKIN DAILY)  . lisinopril (ZESTRIL) 10 MG tablet TAKE 1 TABLET(10 MG) BY MOUTH DAILY  . metFORMIN (GLUCOPHAGE) 1000 MG tablet TAKE 1 TABLET(1000 MG) BY MOUTH  TWICE DAILY WITH A MEAL  . montelukast (SINGULAIR) 10 MG tablet TAKE 1 TABLET(10 MG) BY MOUTH AT BEDTIME  . spironolactone (ALDACTONE) 25 MG tablet TAKE 1 TABLET BY MOUTH EVERY 2 DAYS FOR 90 DAYS  . SYMBICORT 160-4.5 MCG/ACT inhaler INHALE 2 PUFFS INTO THE LUNGS TWICE DAILY.  Marland Kitchen XARELTO 20 MG TABS tablet TAKE 1 TABLET(20 MG) BY MOUTH DAILY WITH SUPPER   No facility-administered encounter medications on file as of 04/12/2020.    Allergies as of 04/12/2020 - Review Complete 04/12/2020  Allergen Reaction Noted  . Quinolones  07/07/2019    Past Medical History:  Diagnosis Date  . 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 Asthma   . Atrial fibrillation ([facility]) 09/02/2018  . CHF (congestive heart failure) ([facility])   . Controlled diabetes mellitus type 2 with complications ([facility]) 19/14/7829  . Coronary artery calcification 06/11/2019  . Hyperlipidemia   . Hypertension   . Pneumonia 2019  . Staph infection    to elbow 15-50yrs ago    Past Surgical History:  Procedure Laterality Date  . PALATE / UVULA BIOPSY / EXCISION     trimmed several times    Family History  Problem Relation Age of Onset  . Lupus Mother   . Hypertension Mother   . Diabetes Father   . Diabetes Paternal Grandmother   . Colon cancer Neg Hx   . Stomach cancer Neg Hx   . Esophageal cancer Neg Hx   .  Rectal cancer Neg Hx     Social History   Socioeconomic History  . Marital status: Married    Spouse name: Not on file  . Number of children: Not on file  . Years of education: Not on file  . Highest education level: Not on file  Occupational History  . Not on file  Tobacco Use  . Smoking status: Never Smoker  . Smokeless tobacco: Current User    Types: Snuff  Vaping Use  . Vaping Use: Never used  Substance and Sexual Activity  . Alcohol use: Yes    Alcohol/week: 10.0 standard drinks    Types: 10 Shots of liquor per week    Comment: 10 drinks a week  . Drug use: Yes    Types: Marijuana    Comment:  occ  . Sexual activity: Not on file  Other Topics Concern  . Not on file  Social History Narrative  . Not on file   Social Determinants of Health   Financial Resource Strain:   . Difficulty of Paying Living Expenses:   Food Insecurity:   . Worried About Charity fundraiser in the Last Year:   . Arboriculturist in the Last Year:   Transportation Needs:   . Film/video editor (Medical):   Marland Kitchen Lack of Transportation (Non-Medical):   Physical Activity:   . Days of Exercise per Week:   . Minutes of Exercise per Session:   Stress:   . Feeling of Stress :   Social Connections:   . Frequency of Communication with Friends and Family:   . Frequency of Social Gatherings with Friends and Family:   . Attends Religious Services:   . Active Member of Clubs or Organizations:   . Attends Archivist Meetings:   Marland Kitchen Marital Status:   Intimate Partner Violence:   . Fear of Current or Ex-Partner:   . Emotionally Abused:   Marland Kitchen Physically Abused:   . Sexually Abused:     Review of Systems  Respiratory: Positive for shortness of breath.   All other systems reviewed and are negative.   Vitals:   04/12/20 1410  BP: 128/74  Pulse: 72  Temp: 98.4 F (36.9 C)  SpO2: 94%     Physical Exam Constitutional:      Appearance: He is well-developed.  HENT:     Head: Normocephalic and atraumatic.  Eyes:     Conjunctiva/sclera: Conjunctivae normal.     Pupils: Pupils are equal, round, and reactive to light.  Neck:     Thyroid: No thyromegaly.     Trachea: No tracheal deviation.  Cardiovascular:     Rate and Rhythm: Normal rate and regular rhythm.  Pulmonary:     Effort: Pulmonary effort is normal. No respiratory distress.     Breath sounds: Normal breath sounds. No wheezing or rales.  Chest:     Chest wall: No tenderness.    Data Reviewed: CT scan of the chest reviewed with the patient Most recent echocardiogram from October 2020 shows reduced ejection fraction of 35 to  40%  Assessment:  Shortness of breath -Multifactorial -Better with being on Symbicort  Systolic heart failure -On optimal management  Wheezing/bronchospasm -Multiple allergens -Trigger avoidance as tolerated -Symbicort seems to be helping  Plan/Recommendations: Continue Symbicort  Continue albuterol as needed  Continue Singulair   Graded exercise as tolerated  Follow-up in 6 months   Sherrilyn Rist MD [facility] 04/12/2020, 2:28 PM  CC:  Henson, Vickie L, NP-C

## 2020-04-12 NOTE — Patient Instructions (Signed)
Continue current lines of care Continue Symbicort  I will follow-up with you in about 6 months  Continue graded exercises as tolerated  Call with any concerns

## 2020-04-30 ENCOUNTER — Other Ambulatory Visit: Payer: Self-pay

## 2020-04-30 ENCOUNTER — Ambulatory Visit (HOSPITAL_BASED_OUTPATIENT_CLINIC_OR_DEPARTMENT_OTHER): Payer: Medicare Other | Attending: Family Medicine | Admitting: Cardiology

## 2020-04-30 DIAGNOSIS — R0683 Snoring: Secondary | ICD-10-CM

## 2020-04-30 DIAGNOSIS — G479 Sleep disorder, unspecified: Secondary | ICD-10-CM

## 2020-04-30 DIAGNOSIS — R0902 Hypoxemia: Secondary | ICD-10-CM | POA: Diagnosis not present

## 2020-04-30 DIAGNOSIS — R0681 Apnea, not elsewhere classified: Secondary | ICD-10-CM

## 2020-04-30 DIAGNOSIS — G4733 Obstructive sleep apnea (adult) (pediatric): Secondary | ICD-10-CM | POA: Insufficient documentation

## 2020-04-30 DIAGNOSIS — Z8669 Personal history of other diseases of the nervous system and sense organs: Secondary | ICD-10-CM

## 2020-05-05 NOTE — Progress Notes (Signed)
Please let him know that he has severe obstructive sleep apnea and his oxygen level drops significantly while sleeping.  It is recommended that he do an in lab CPAP titration study due to the severity of his results.  Please order this. The diagnoses are below.  He should also avoid sleeping on his back and any alcohol or sedatives before going to sleep.     DIAGNOSIS - Obstructive Sleep Apnea (G47.33) - Nocturnal Hypoxemia (G47.36)

## 2020-05-05 NOTE — Procedures (Signed)
   Patient Name: Theodore Bush, [place] Date: 05/02/2020 Gender: Male D.O.B: [birth year] Age (years): 19 Referring Provider: Girtha Rm NP Height (inches): 53 Interpreting Physician: Fransico Him MD, ABSM Weight (lbs): 205 RPSGT: Jacolyn Reedy BMI: 30 MRN: [id number] Neck Size: 17.00  CLINICAL INFORMATION Sleep Study Type: HST  Indication for sleep study: N/A  Epworth Sleepiness Score: 6  SLEEP STUDY TECHNIQUE A multi-channel overnight portable sleep study was performed. The channels recorded were: nasal airflow, thoracic respiratory movement, and oxygen saturation with a pulse oximetry. Snoring was also monitored.  MEDICATIONS Patient self administered medications include: N/A.  SLEEP ARCHITECTURE Patient was studied for 510.6 minutes. The sleep efficiency was 100.0 % and the patient was supine for 79.5%. The arousal index was 0.0 per hour.  RESPIRATORY PARAMETERS The overall AHI was 52.8 per hour, with a central apnea index of 0.0 per hour.  The oxygen nadir was 77% during sleep.  CARDIAC DATA Mean heart rate during sleep was 94.7 bpm.  IMPRESSIONS - Severe obstructive sleep apnea occurred during this study (AHI = 52.8/h). - No significant central sleep apnea occurred during this study (CAI = 0.0/h). - Severe oxygen desaturation was noted during this study (Min O2 = 77%). - Patient snored 12.4% during the sleep  DIAGNOSIS - Obstructive Sleep Apnea (G47.33) - Nocturnal Hypoxemia (G47.36)  RECOMMENDATIONS - Recommend in lab CPAP titration due to severity of OSA and nocturnal hypoxemia. - Positional therapy avoiding supine position during sleep. - Avoid alcohol, sedatives and other CNS depressants that may worsen sleep apnea and disrupt normal sleep architecture. - Sleep hygiene should be reviewed to assess factors that may improve sleep quality. - Weight management and regular exercise should be initiated or continued.  [Electronically signed] 05/05/2020  12:52 PM  Fransico Him MD, ABSM Diplomate, American Board of Sleep Medicine

## 2020-05-06 ENCOUNTER — Other Ambulatory Visit: Payer: Self-pay | Admitting: Internal Medicine

## 2020-05-06 DIAGNOSIS — G4734 Idiopathic sleep related nonobstructive alveolar hypoventilation: Secondary | ICD-10-CM

## 2020-05-06 DIAGNOSIS — G4733 Obstructive sleep apnea (adult) (pediatric): Secondary | ICD-10-CM

## 2020-05-06 NOTE — Progress Notes (Signed)
Pt notified and order has been placed

## 2020-05-10 ENCOUNTER — Other Ambulatory Visit: Payer: Self-pay | Admitting: Family Medicine

## 2020-05-10 ENCOUNTER — Telehealth: Payer: Self-pay

## 2020-05-10 NOTE — Telephone Encounter (Signed)
-----   Message from Sueanne Margarita, MD sent at 05/05/2020 12:54 PM EDT ----- Please let patient know that they have sleep apnea and recommend CPAP titration. Please set up titration in the sleep lab.

## 2020-05-10 NOTE — Telephone Encounter (Signed)
Pt's PCP made him aware of results and recommendations. Orders have been placed by PCP for CPAP Titration. Will send to precert.

## 2020-05-11 ENCOUNTER — Telehealth: Payer: Self-pay | Admitting: *Deleted

## 2020-05-11 NOTE — Telephone Encounter (Signed)
Staff message sent to Gae Bon ok to schedule CPAP titration. Per [facility] web portal no PA is required. Decision [id number].

## 2020-05-12 NOTE — Progress Notes (Signed)
Subjective:    Patient ID: Theodore Bush, male    DOB: [birth year], 69 y.o.   MRN: [id number]  Theodore Bush is a 69 y.o. male who presents for follow-up of Type 2 diabetes mellitus and to follow up on chronic health conditions.   Sleep study showed severe sleep apnea. Waiting on in lab sleep study for CPAP titration.   Patient is checking home blood sugars.   Home blood sugar records: BGs range between 77 and 200 How often is blood sugars being checked: daily in am Current symptoms include: none. Patient denies increased appetite, nausea, visual disturbances, vomiting and weight loss.  Patient is not checking their feet daily. Any Foot concerns (callous, ulcer, wound, thickened nails, toenail fungus, skin fungus, hammer toe): none Last dilated eye exam: is scheduling an eye exam   Current treatments: Lantus 30 units, Jardiance 25, Metformin 1,000 mg bid Medication compliance: fair  Current diet: in general, a "healthy" diet   he does snack in the middle of the night. Drinks alcohol  Current exercise: yard work Known diabetic complications: cardiovascular disease  The following portions of the patient's history were reviewed and updated as appropriate: allergies, current medications, past medical history, past social history and problem list.  ROS as in subjective above.     Objective:    Physical Exam Alert and in no distress. Cardiac exam shows a irregular rhythm, normal rate.  Lungs with scattered rhonchi. Respirations unlabored. Extremities without edema. Normal foot exam including to monofilament.    Blood pressure 120/70, pulse 83, weight 199 lb 9.6 oz (90.5 kg), SpO2 96 %.  Lab Review Diabetic Labs Latest Ref Rng & Units 05/13/2020 02/11/2020 12/11/2019 06/11/2019 08/14/2017  HbA1c 4.0 - 5.6 % 6.9(A) 6.4(A) - 6.6(A) -  Micro/Creat Ratio 0 - 29 mg/g creat - 10 - - -  Chol 100 - 199 mg/dL - - 132 133 -  HDL >39 mg/dL - - 52 48 -  Calc LDL 0 - 99 mg/dL - - 59 65 -   Triglycerides 0 - 149 mg/dL - - 118 107 -  Creatinine 0.76 - 1.27 mg/dL - 1.42(H) 1.30(H) 1.37(H) 1.36(H)   BP/Weight 05/13/2020 04/12/2020 03/26/2020 03/09/2020 7/89/3810  Systolic BP 175 102 585 277 824  Diastolic BP 70 74 89 76 80  Wt. (Lbs) 199.6 205 200 200 202.6  BMI 30.35 31.17 30.41 29.97 30.81   Foot/eye exam completion dates 05/13/2020  Foot Form Completion Done    Emmanuell  reports that he has never smoked. His smokeless tobacco use includes snuff. He reports current alcohol use of about 10.0 standard drinks of alcohol per week. He reports current drug use. Drug: Marijuana.     Assessment & Plan:    Controlled type 2 diabetes mellitus with complication, with long-term current use of insulin (HCC) - Plan: HgB M3N, Basic metabolic panel  Mixed dyslipidemia  Chronic kidney disease, unspecified CKD stage - Plan: Basic metabolic panel  Chronic anticoagulation  Insomnia, unspecified type  Moderate persistent asthma, unspecified whether complicated  1. Rx changes: none Hgb A1c 6.9%  2. Education: Reviewed 'ABCs' of diabetes management (respective goals in parentheses):  A1C (<7), blood pressure (<130/80), and cholesterol (LDL <100). 3. Compliance at present is estimated to be good. Efforts to improve compliance (if necessary) will be directed at dietary modifications: cut back on snacking and eat healther snacks. 4. HTN- continue medication regimen. Check BP at home and follow up with cardiology as scheduled.  5. HL- continue statin  6.  Asthma- refill Symbicort since he is out and have him follow up with pulmonology 7. A-fib on Xarelto  8. Insomnia- recommend avoiding napping during the day and avoiding sedating medications until using CPAP. He needs the CPAP titration study still.  9. Follow up: 4 months

## 2020-05-13 ENCOUNTER — Other Ambulatory Visit: Payer: Self-pay | Admitting: Family Medicine

## 2020-05-13 ENCOUNTER — Other Ambulatory Visit: Payer: Self-pay | Admitting: *Deleted

## 2020-05-13 ENCOUNTER — Ambulatory Visit (INDEPENDENT_AMBULATORY_CARE_PROVIDER_SITE_OTHER): Payer: Medicare Other | Admitting: Family Medicine

## 2020-05-13 ENCOUNTER — Other Ambulatory Visit: Payer: Self-pay

## 2020-05-13 ENCOUNTER — Telehealth: Payer: Self-pay | Admitting: Cardiology

## 2020-05-13 ENCOUNTER — Encounter: Payer: Self-pay | Admitting: Family Medicine

## 2020-05-13 VITALS — BP 120/70 | HR 83 | Wt 199.6 lb

## 2020-05-13 DIAGNOSIS — E118 Type 2 diabetes mellitus with unspecified complications: Secondary | ICD-10-CM | POA: Diagnosis not present

## 2020-05-13 DIAGNOSIS — Z794 Long term (current) use of insulin: Secondary | ICD-10-CM

## 2020-05-13 DIAGNOSIS — N189 Chronic kidney disease, unspecified: Secondary | ICD-10-CM

## 2020-05-13 DIAGNOSIS — E782 Mixed hyperlipidemia: Secondary | ICD-10-CM

## 2020-05-13 DIAGNOSIS — J4540 Moderate persistent asthma, uncomplicated: Secondary | ICD-10-CM

## 2020-05-13 DIAGNOSIS — Z7901 Long term (current) use of anticoagulants: Secondary | ICD-10-CM | POA: Diagnosis not present

## 2020-05-13 DIAGNOSIS — I4819 Other persistent atrial fibrillation: Secondary | ICD-10-CM

## 2020-05-13 DIAGNOSIS — G4700 Insomnia, unspecified: Secondary | ICD-10-CM

## 2020-05-13 LAB — POCT GLYCOSYLATED HEMOGLOBIN (HGB A1C): Hemoglobin A1C: 6.9 % — AB (ref 4.0–5.6)

## 2020-05-13 MED ORDER — ATORVASTATIN CALCIUM 80 MG PO TABS: 80.0000 mg | ORAL_TABLET | Freq: Every day | ORAL | 2 refills | Status: DC

## 2020-05-13 MED ORDER — CARVEDILOL 25 MG PO TABS: 25.0000 mg | ORAL_TABLET | Freq: Two times a day (BID) | ORAL | 2 refills | Status: DC

## 2020-05-13 MED ORDER — RIVAROXABAN 20 MG PO TABS: ORAL_TABLET | ORAL | 2 refills | Status: DC

## 2020-05-13 MED ORDER — LISINOPRIL 10 MG PO TABS: ORAL_TABLET | ORAL | 2 refills | Status: DC

## 2020-05-13 MED ORDER — FUROSEMIDE 20 MG PO TABS: 20.0000 mg | ORAL_TABLET | Freq: Every day | ORAL | 2 refills | Status: DC

## 2020-05-13 MED ORDER — BUDESONIDE-FORMOTEROL FUMARATE 160-4.5 MCG/ACT IN AERO: 2.0000 | INHALATION_SPRAY | Freq: Two times a day (BID) | RESPIRATORY_TRACT | 1 refills | Status: DC

## 2020-05-13 NOTE — Patient Instructions (Signed)
Your hemoglobin A1c is 6.9% and your diabetes is still controlled.  Continue on your current medications.  Recommend cutting back on sugar, snacks and carbohydrates.  I also recommend cutting back on alcohol use.  Avoid any sedating medications until you are using your CPAP.

## 2020-05-13 NOTE — Telephone Encounter (Signed)
Rx has been sent to the pharmacy electronically. ° °

## 2020-05-13 NOTE — Telephone Encounter (Signed)
Yes, let's communicate that to him at his visit today.

## 2020-05-13 NOTE — Telephone Encounter (Signed)
Pt was notified at appt today

## 2020-05-13 NOTE — Telephone Encounter (Signed)
We have refilled bp med the last few times. Does he need to start getting bp med from cardiology

## 2020-05-13 NOTE — Telephone Encounter (Signed)
Does pt need to get this from pulmonologist

## 2020-05-13 NOTE — Telephone Encounter (Signed)
*  STAT* If patient is at the pharmacy, call can be transferred to refill team.   1. Which medications need to be refilled? (please list name of each medication and dose if known) lisinopril (ZESTRIL) 10 MG tablet, atorvastatin (LIPITOR) 80 MG tablet, carvedilol (COREG) 25 MG tablet, Fenofibrate 50 MG CAPS, furosemide (LASIX) 20 MG tablet, spironolactone (ALDACTONE) 25 MG tablet, XARELTO 20 MG TABS tablet  2. Which pharmacy/location (including street and city if local pharmacy) is medication to be sent to? [id number] - [place], [place] [address] AT [place]  3. Do they need a 30 day or 90 day supply? 57   [facility] called because patient's PCP would like Dr. Martinique to start refilling this patients cardiac medications. Please send the one's that Dr. Martinique is OK with refilling. She states he definitely needs a refill on Lisinopril.

## 2020-05-13 NOTE — Telephone Encounter (Signed)
Yes, please help me remember to mention this to him today at his visit.

## 2020-05-14 ENCOUNTER — Other Ambulatory Visit: Payer: Self-pay | Admitting: Family Medicine

## 2020-05-14 ENCOUNTER — Other Ambulatory Visit: Payer: Self-pay | Admitting: Pulmonary Disease

## 2020-05-14 LAB — BASIC METABOLIC PANEL
BUN/Creatinine Ratio: 19 (ref 10–24)
BUN: 26 mg/dL (ref 8–27)
CO2: 28 mmol/L (ref 20–29)
Calcium: 9.9 mg/dL (ref 8.6–10.2)
Chloride: 96 mmol/L (ref 96–106)
Creatinine, Ser: 1.38 mg/dL — ABNORMAL HIGH (ref 0.76–1.27)
GFR calc Af Amer: 60 mL/min/{1.73_m2} (ref >59)
GFR calc non Af Amer: 52 mL/min/{1.73_m2} — ABNORMAL LOW (ref >59)
Glucose: 192 mg/dL — ABNORMAL HIGH (ref 65–99)
Potassium: 4.7 mmol/L (ref 3.5–5.2)
Sodium: 139 mmol/L (ref 134–144)

## 2020-05-17 ENCOUNTER — Telehealth: Payer: Self-pay | Admitting: *Deleted

## 2020-05-17 NOTE — Telephone Encounter (Signed)
-----   Message from Lauralee Evener, [place] sent at 05/11/2020  2:20 PM EDT ----- Regarding: RE: CPAP Titiration Ok to schedule CPAP titration. Per [facility] web portal no PA is required. Decision OE:[id number]. ----- Message ----- From: Bobby Rumpf, CMA Sent: 05/10/2020   2:55 PM EDT To: Cv Div Sleep Studies Subject: CPAP Titiration                                Hello,  This pt needs an in lab CPAP Titration. Order is in from PCP  Thanks

## 2020-05-17 NOTE — Telephone Encounter (Signed)
Patient is scheduled for CPAP Titration on 06/13/20. Patient understands his titration study will be done at [facility] sleep lab. Patient understands he will receive a letter in a week or so detailing appointment, date, time, and location. Patient understands to call if he does not receive the letter  in a timely manner. Left detailed message on voicemail with date and time of titration and informed patient to call back to confirm or reschedule.

## 2020-05-28 ENCOUNTER — Other Ambulatory Visit: Payer: Self-pay | Admitting: Family Medicine

## 2020-05-30 ENCOUNTER — Telehealth: Payer: Self-pay | Admitting: Family Medicine

## 2020-05-30 NOTE — Telephone Encounter (Signed)
P.A. BUDESONDIE FORMOTEROL 

## 2020-05-30 NOTE — Telephone Encounter (Signed)
Recv'd fax from [facility] that Symbicort not covered by insurance can pt be switched to covered alternative Advair, Ruthe Mannan, Breo, Flovent, [place], Trelegy

## 2020-06-01 NOTE — Telephone Encounter (Signed)
Symbicort brand is covered instead .  Called pharmacy and went thru

## 2020-06-07 ENCOUNTER — Telehealth: Payer: Self-pay | Admitting: Cardiology

## 2020-06-07 NOTE — Telephone Encounter (Signed)
Spoke with patient regarding appointment for CTA chest/aorta scheduled 10:30 am Wednesday 07/07/20 at [facility] time 10:15 am  1st floor radiology department.  Liquids only 4 hours prior to exam.  Patient to have lab work drawn 1 week prior to exam.  Will mail information to patient and her voiced his understanding.  Information also available on My Chart.

## 2020-06-08 ENCOUNTER — Other Ambulatory Visit: Payer: Self-pay | Admitting: Cardiology

## 2020-06-12 NOTE — Telephone Encounter (Signed)
Symbicort brand is covered instead .  Called pharmacy and went thru

## 2020-06-13 ENCOUNTER — Encounter (HOSPITAL_BASED_OUTPATIENT_CLINIC_OR_DEPARTMENT_OTHER): Payer: Medicare Other | Admitting: Cardiology

## 2020-07-01 ENCOUNTER — Other Ambulatory Visit: Payer: Self-pay

## 2020-07-01 ENCOUNTER — Ambulatory Visit (HOSPITAL_BASED_OUTPATIENT_CLINIC_OR_DEPARTMENT_OTHER): Payer: Medicare Other | Attending: Family Medicine | Admitting: Cardiology

## 2020-07-01 DIAGNOSIS — G4733 Obstructive sleep apnea (adult) (pediatric): Secondary | ICD-10-CM | POA: Diagnosis present

## 2020-07-01 DIAGNOSIS — I4892 Unspecified atrial flutter: Secondary | ICD-10-CM | POA: Insufficient documentation

## 2020-07-01 DIAGNOSIS — G4734 Idiopathic sleep related nonobstructive alveolar hypoventilation: Secondary | ICD-10-CM

## 2020-07-03 ENCOUNTER — Other Ambulatory Visit: Payer: Self-pay | Admitting: Family Medicine

## 2020-07-04 NOTE — Procedures (Signed)
   Patient Name: Theodore Bush, Theodore Bush Date: 07/01/2020 Gender: Male D.O.B: Aug 15, 1951 Age (years): 10 Referring Provider: Girtha Rm NP Height (inches): 69 Interpreting Physician: Fransico Him MD, ABSM Weight (lbs): 205 RPSGT: Jorge Ny BMI: 30 MRN: [id number] Neck Size: 17.00  CLINICAL INFORMATION The patient is referred for a CPAP titration to treat sleep apnea.  SLEEP STUDY TECHNIQUE As per the AASM Manual for the Scoring of Sleep and Associated Events v2.3 (April 2016) with a hypopnea requiring 4% desaturations.  The channels recorded and monitored were frontal, central and occipital EEG, electrooculogram (EOG), submentalis EMG (chin), nasal and oral airflow, thoracic and abdominal wall motion, anterior tibialis EMG, snore microphone, electrocardiogram, and pulse oximetry. Continuous positive airway pressure (CPAP) was initiated at the beginning of the study and titrated to treat sleep-disordered breathing.  MEDICATIONS Medications self-administered by patient taken the night of the study : BENADRYL  TECHNICIAN COMMENTS Comments added by technician: Patient had difficulty initiating sleep. Comments added by scorer: N/A  RESPIRATORY PARAMETERS Optimal PAP Pressure (cm): 12  AHI at Optimal Pressure (/hr):0.0 Overall Minimal O2 (%):85.0  Supine % at Optimal Pressure (%):0 Minimal O2 at Optimal Pressure (%): 91.0   SLEEP ARCHITECTURE The study was initiated at 10:33:04 PM and ended at 4:58:19 AM.  Sleep onset time was 48.9 minutes and the sleep efficiency was 70.5%. The total sleep time was 271.5 minutes.  The patient spent 12.3% of the night in stage N1 sleep, 77.2% in stage N2 sleep, 0.0% in stage N3 and 10.5% in REM.Stage REM latency was 68.5 minutes  Wake after sleep onset was 64.8. Alpha intrusion was absent. Supine sleep was 7.26%.  CARDIAC DATA The 2 lead EKG demonstrated sinus rhythm, atrial flutter The mean heart rate was 89.1 beats per minute. Other  EKG findings include: PVCs.  LEG MOVEMENT DATA The total Periodic Limb Movements of Sleep (PLMS) were 0. The PLMS index was 0.0. A PLMS index of <15 is considered normal in adults.  IMPRESSIONS - The optimal PAP pressure was 12 cm of water. - Central sleep apnea was not noted during this titration (CAI = 0.4/h). - Moderate oxygen desaturations were observed during this titration (min O2 = 85.0%). - The patient snored with moderate snoring volume during this titration study. - 2-lead EKG demonstrated: PVCs - Clinically significant periodic limb movements were not noted during this study. Arousals associated with PLMs were rare.  DIAGNOSIS - Obstructive Sleep Apnea (G47.33) - Atrial Flutter  RECOMMENDATIONS - Trial of CPAP therapy on 12 cm H2O with a Medium size Fisher&Paykel Full Face Mask Simplus mask and heated humidification. - Avoid alcohol, sedatives and other CNS depressants that may worsen sleep apnea and disrupt normal sleep architecture. - Sleep hygiene should be reviewed to assess factors that may improve sleep quality. - Weight management and regular exercise should be initiated or continued. - Return to [facility] for re-evaluation after 8 weeks of therapy  [Electronically signed] 07/04/2020 05:26 PM  Fransico Him MD, ABSM Diplomate, American Board of Sleep Medicine

## 2020-07-07 ENCOUNTER — Encounter (HOSPITAL_COMMUNITY): Payer: Self-pay

## 2020-07-07 ENCOUNTER — Ambulatory Visit (HOSPITAL_COMMUNITY)
Admission: RE | Admit: 2020-07-07 | Discharge: 2020-07-07 | Disposition: A | Discharge status: Home / Self Care | Payer: Medicare Other | Source: Ambulatory Visit | Attending: Cardiology | Admitting: Cardiology

## 2020-07-07 ENCOUNTER — Other Ambulatory Visit: Payer: Self-pay

## 2020-07-07 DIAGNOSIS — I712 Thoracic aortic aneurysm, without rupture, unspecified: Secondary | ICD-10-CM

## 2020-07-07 LAB — POCT I-STAT CREATININE: Creatinine, Ser: 1.3 mg/dL — ABNORMAL HIGH (ref 0.61–1.24)

## 2020-07-07 MED ORDER — IOHEXOL 350 MG/ML SOLN
100.0000 mL | Freq: Once | INTRAVENOUS | Status: AC | PRN
  Administered 2020-07-07: 100 mL via INTRAVENOUS

## 2020-07-08 ENCOUNTER — Telehealth: Payer: Self-pay

## 2020-07-08 NOTE — Telephone Encounter (Signed)
-----   Message from Sueanne Margarita, MD sent at 07/04/2020  5:29 PM EDT ----- Please let patient know that they had a successful PAP titration and let DME know that orders are in EPIC.  Please set up 8 week OV with me.

## 2020-07-08 NOTE — Telephone Encounter (Signed)
Informed patient of CPAP titration results  and patient understanding was verbalized. Patient understands her CPAP TItration showed  - The optimal PAP pressure was 12 cm of water. - Central sleep apnea was not noted during this titration (CAI = 0.4/h). - Moderate oxygen desaturations were observed during this titration (min O2 = 85.0%). - The patient snored with moderate snoring volume during this titration study. - 2-lead EKG demonstrated: PVCs - Clinically significant periodic limb movements were not noted during this study. Arousals associated with PLMs were rare. Upon patient request DME selection is [facility] on [address]. Patient understands he will be contacted by [facility] to set up his cpap. Patient understands to call if [facility] does not contact him with new setup in a timely manner. Patient understands they will be called once confirmation has been received from [facility] that they have received their new machine to schedule 8 week follow up appointment.   [facility] notified of new cpap order  Please add to airview Patient was grateful for the call and thanked me.

## 2020-07-08 NOTE — Progress Notes (Signed)
Please let him know that his recent CT scan done by cardiology shows that he may have cirrhosis of the liver. I recommend he follow up with his GI doctor about this. They may want to do more testing. He should stop drinking alcohol.

## 2020-07-28 LAB — HM DIABETES EYE EXAM

## 2020-07-29 ENCOUNTER — Other Ambulatory Visit: Payer: Self-pay | Admitting: Family Medicine

## 2020-07-29 NOTE — Telephone Encounter (Signed)
Is this okay to refill? 

## 2020-07-29 NOTE — Progress Notes (Signed)
Cardiology Office Note   Date:  07/30/2020   ID:  Theodore Bush, DOB 04/29/1951, MRN [id number]  PCP:  Girtha Rm, NP-C  Cardiologist:   Myrlene Riera Martinique, MD   Chief Complaint  Patient presents with  . Thoracic Aortic Aneurysm      History of Present Illness: Theodore Bush is a 69 y.o. male who is for follow up  of CHF and history of Afib. I saw him for the first time one month ago. Patient moved to [facility] a couple of years ago to be closer to family. Moved from [place]. He has a history of atrial fibrillation, CHF and CAD. He reports he has been admitted a couple of times for PNA and at one time had "fluid in the lungs" and required thoracentesis. He has a difficult time remembering when he was hospitalized or any details about prior cardiac work up. States he did have a stress test at some point in the past. We do have records of a chest CT done for a pulmonary nodule that showed severe coronary calcification. No prior history of MI. Thinks he had Afib 2-3 years ago. Initially on coumadin but later switched to Xarelto. 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 He has a history of DM on insulin, HTN, and HLD. Has CKD stage 3.   We requested old records on his last visit. To date we have received none. An Echo was obtained here showing an EF of 35-40%.Global hypokinesis. Aortic root enlarged to 49 mm. CT chest done showing aneurysm of 4.7 cm. Coronary calcification. Scattered pulmonary nodules. Evidence of cirrhosis. More recent CT showed aneurysm diameter 4.9 cm.   On follow up today he is doing well. Reports good BP control. No chest pain or dyspnea. Will finding of cirrhosis on CT notes he quit drinking Etoh 3 weeks ago. Was drinking 2 liters of Vodka every 10 days before. Hasn't noted any withdrawal symptoms.    Past Medical History:  Diagnosis Date  . 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 Asthma   . Atrial fibrillation ([place])  09/02/2018  . CHF (congestive heart failure) ([place])   . Controlled diabetes mellitus type 2 with complications ([place]) 65/46/5035  . Coronary artery calcification 06/11/2019  . Hyperlipidemia   . Hypertension   . Pneumonia 2019  . Staph infection    to elbow 15-45yrs ago    Past Surgical History:  Procedure Laterality Date  . PALATE / UVULA BIOPSY / EXCISION     trimmed several times     Current Outpatient Medications  Medication Sig Dispense Refill  . Adalimumab (HUMIRA PEN) 40 MG/0.4ML PNKT Inject 1 each into the skin. 2 times a months.    Marland Kitchen albuterol (PROAIR HFA) 108 (90 Base) MCG/ACT inhaler Inhale 1 puff into the lungs every 6 (six) hours as needed for wheezing or shortness of breath. 18 g 5  . atorvastatin (LIPITOR) 80 MG tablet Take 1 tablet (80 mg total) by mouth daily. 90 tablet 2  . carvedilol (COREG) 25 MG tablet Take 1 tablet (25 mg total) by mouth 2 (two) times daily. 180 tablet 2  . Fenofibrate 50 MG CAPS TAKE 1 CAPSULE(50 MG) BY MOUTH DAILY 90 capsule 0  . furosemide (LASIX) 20 MG tablet TAKE 1 TABLET(20 MG) BY MOUTH DAILY 90 tablet 2  . glucose blood (ACCU-CHEK GUIDE) test strip Test 1-2 a day. Pt has accu- chek meter 100 each 2  .  HYDROcodone-acetaminophen (NORCO) 10-325 MG tablet Take 1 tablet by mouth every 6 (six) hours as needed.    . Insulin Pen Needle (PEN NEEDLES) 32G X 4 MM MISC 1 each by Other route daily. 100 each 2  . JARDIANCE 25 MG TABS tablet TAKE 1 TABLET BY MOUTH EVERY DAY 90 tablet 1  . LANTUS SOLOSTAR 100 UNIT/ML Solostar Pen ADMINISTER 40 UNITS UNDER THE SKIN DAILY (Patient taking differently: 30 Units. ADMINISTER 40 UNITS UNDER THE SKIN DAILY) 15 mL 1  . metFORMIN (GLUCOPHAGE) 1000 MG tablet TAKE 1 TABLET(1000 MG) BY MOUTH TWICE DAILY WITH A MEAL 180 tablet 0  . montelukast (SINGULAIR) 10 MG tablet TAKE 1 TABLET(10 MG) BY MOUTH AT BEDTIME 30 tablet 5  . rivaroxaban (XARELTO) 20 MG TABS tablet TAKE 1 TABLET(20 MG) BY MOUTH DAILY WITH SUPPER 90 tablet 2   . spironolactone (ALDACTONE) 25 MG tablet TAKE 1 TABLET BY MOUTH EVERY 2 DAYS FOR 90 DAYS 45 tablet 1  . SYMBICORT 160-4.5 MCG/ACT inhaler INHALE 2 PUFFS INTO THE LUNGS TWICE DAILY 10.2 g 1   No current facility-administered medications for this visit.    Allergies:   Quinolones    Social History:  The patient  reports that he has never smoked. His smokeless tobacco use includes snuff. He reports current alcohol use of about 10.0 standard drinks of alcohol per week. He reports current drug use. Drug: Marijuana.   Family History:  The patient's family history includes Diabetes in his father and paternal grandmother; Hypertension in his mother; Lupus in his mother.    ROS:  Please see the history of present illness.   Otherwise, review of systems are positive for none.   All other systems are reviewed and negative.    PHYSICAL EXAM: VS:  BP 120/80   Pulse (!) 54   Ht 5\' 9"  (1.753 m)   Wt 199 lb 12.8 oz (90.6 kg)   SpO2 95%   BMI 29.51 kg/m  , BMI Body mass index is 29.51 kg/m. GEN: Well nourished, well developed, in no acute distress  HEENT: normal  Neck: no JVD, carotid bruits, or masses Cardiac: RRR; no murmurs, rubs, or gallops,no edema  Respiratory:  Few scattered wheezes.  GI: soft, nontender, nondistended, + BS MS: no deformity or atrophy  Skin: warm and dry, no rash Neuro:  Strength and sensation are intact Psych: euthymic mood, full affect   EKG:  EKG is ordered today. NSR with rate 54. First degree AV block. Possible old septal infarct. I have personally reviewed and interpreted this study.   Recent Labs: 12/11/2019: ALT 25; Hemoglobin 15.4; Platelets 183 05/13/2020: BUN 26; Potassium 4.7; Sodium 139 07/07/2020: Creatinine, Ser 1.30    Lipid Panel    Component Value Date/Time   CHOL 132 12/11/2019 1520   TRIG 118 12/11/2019 1520   HDL 52 12/11/2019 1520   CHOLHDL 2.5 12/11/2019 1520   LDLCALC 59 12/11/2019 1520      Wt Readings from Last 3 Encounters:    07/30/20 199 lb 12.8 oz (90.6 kg)  07/01/20 205 lb (93 kg)  05/13/20 199 lb 9.6 oz (90.5 kg)      Other studies Reviewed: Additional studies/ records that were reviewed today include:   Echo 06/26/19: IMPRESSIONS    1. Left ventricular ejection fraction, by visual estimation, is 35 to 40%. The left ventricle has moderately decreased function. Normal left ventricular size. There is mildly increased left ventricular hypertrophy. Diffuse hypokinesis.  2. Left ventricular diastolic Doppler parameters  are indeterminate pattern of LV diastolic filling. Rhythm uncertain.  3. Trivial pericardial effusion is present.  4. Moderate mitral annular calcification.  5. The mitral valve is normal in structure. Trace mitral valve regurgitation. No evidence of mitral stenosis.  6. The tricuspid valve is normal in structure. Tricuspid valve regurgitation is trivial.  7. The aortic valve is tricuspid Aortic valve regurgitation is mild by color flow Doppler. Mild to moderate aortic valve sclerosis/calcification without any evidence of aortic stenosis.  8. There is severe dilatation of the ascending aorta measuring 49 mm. Suggest MRA or CTA chest to further evaluate.  9. Global right ventricle has mildly reduced systolic function.The right ventricular size is normal. No increase in right ventricular wall thickness. 10. Left atrial size was moderately dilated. 11. Right atrial size was mildly dilated. 12. The inferior vena cava is normal in size with greater than 50% respiratory variability, suggesting right atrial pressure of 3 mmHg. 13. TR signal is inadequate for assessing pulmonary artery systolic pressure.  Chest CT 07/04/19: IMPRESSION: 1. No acute intrathoracic pathology. No CT evidence of aortic dissection. 2. Dilated ascending aorta measuring up to 4.7 cm in diameter. Ascending thoracic aortic aneurysm. Recommend semi-annual imaging followup by CTA or MRA and referral to cardiothoracic surgery  if not already obtained. This recommendation follows 2010 ACCF/AHA/AATS/ACR/ASA/SCA/SCAI/SIR/STS/SVM Guidelines for the Diagnosis and Management of Patients With Thoracic Aortic Disease. Circulation. 2010; 121: H631-S970. Aortic aneurysm NOS (ICD10-I71.9) 3. Cardiomegaly with 3 vessel coronary vascular calcification. 4. Small pericardial effusion. 5. Several small pulmonary nodules measure up to 4 mm. No follow-up needed if patient is low-risk (and has no known or suspected primary neoplasm). Non-contrast chest CT can be considered in 12 months if patient is high-risk. This recommendation follows the consensus statement: Guidelines for Management of Incidental Pulmonary Nodules Detected on CT Images: From the Fleischner Society 2017; Radiology 2017; 284:228-243. 6. Cirrhosis.  CLINICAL DATA:  Follow-up thoracic aortic aneurysm.  EXAM: CT ANGIOGRAPHY CHEST WITH CONTRAST  TECHNIQUE: Multidetector CT imaging of the chest was performed using the standard protocol during bolus administration of intravenous contrast. Multiplanar CT image reconstructions and MIPs were obtained to evaluate the vascular anatomy.  CONTRAST:  160mL OMNIPAQUE IOHEXOL 350 MG/ML SOLN  COMPARISON:  Chest CTA 07/04/2019  FINDINGS: Cardiovascular: Aortic root at the sinuses of Valsalva measures 4.5 cm. Sinotubular junction measures 3.4 cm. Fusiform aneurysm of the ascending thoracic aorta measures up to 4.9 cm on sequence 4, image 57 and this is unchanged from the prior examination. Coronary artery calcifications. Aortic arch measures 3.5 cm and stable. Great vessels are patent. Proximal vertebral arteries are patent. Incidentally, the left vertebral artery originates from the aortic arch near the origin of the left subclavian artery. Atherosclerotic calcifications involving the thoracic aorta. Descending thoracic aorta measures 3.3 cm and stable. Distal descending thoracic aorta measures 2.9 cm and  stable. Negative for aortic dissection. Proximal abdominal aorta is patent with atherosclerotic disease. Variant celiac artery anatomy. There is a common trunk for the left gastric artery and splenic artery. Common hepatic artery originates directly from the abdominal aorta. SMA is widely patent at the origin. Bilateral renal arteries are patent with calcified plaque at the origin but no significant stenosis. Mitral annular calcifications. Small amount of pericardial fluid. Main pulmonary artery is enlarged measuring up to 4.2 cm. Main and central pulmonary arteries are patent.  Mediastinum/Nodes: Thyroid tissue is unremarkable. No enlarged mediastinal or hilar lymph nodes. No enlarged axillary lymph nodes.  Lungs/Pleura: Filling defect along the posterior  aspect of the upper trachea and a small linear filling defect extending into the right mainstem bronchus. These endobronchial filling defects likely represent adherent mucus. No significant pleural effusions. Stable tiny calcified granuloma in the anterior right upper lobe on sequence 6 image 59. Stable linear density in the right lower lobe on sequence 6, image 85. Focal volume loss and possible scarring along posterior aspect of the left upper lobe and lingular region. Stable tiny peripheral nodular density in the left lower lobe on sequence 6, image 116. No significant airspace disease or consolidation in the lungs.  Upper Abdomen: Chronic calcification in the right hepatic lobe. Liver contour is slightly nodular particularly along the anterior left hepatic lobe. No acute abnormality in the upper abdomen.  Musculoskeletal: Degenerative changes in lower cervical spine. Bridging osteophytes in thoracic spine.  Review of the MIP images confirms the above findings.  IMPRESSION: 1. 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 Recommend semi-annual imaging followup by CTA or MRA and  referral to cardiothoracic surgery if not already obtained. This recommendation follows 2010 ACCF/AHA/AATS/ACR/ASA/SCA/SCAI/SIR/STS/SVM Guidelines for the Diagnosis and Management of Patients With Thoracic Aortic Disease. Circulation. 2010; 121: D326-Z124. Aortic aneurysm NOS (ICD10-I71.9) 2. Liver contour is slightly nodular particularly in the anterior left hepatic lobe. Early cirrhotic changes cannot be excluded. 3. Enlarged pulmonary arteries. Findings can be associated with pulmonary hypertension. 4. Aortic Atherosclerosis (ICD10-I70.0). Coronary artery calcifications.   Electronically Signed   By: Markus Daft M.D.   On: 07/08/2020 10:29  ASSESSMENT AND PLAN:  1. CHF. Chronic systolic. EF 58-09%.Currently appears well compensated on aldactone, Coreg, and lisinopril. Also on Jardiance. Suspect LV dysfunction may be related to history of Etoh abuse. I am pleased that he has quit drinking.  Sodium restriction. I have recommended switching lisinopril to Entresto 24/26 mg bid. Will hold lisinopril for 3 days then start Entresto. Will arrange follow up with Pharm D to titrate.  2. Atrial fibrillation- paroxysmal. In NSR now. Infrequent symptoms. On chronic Xarelto.  3. CAD. Based on heavy coronary calcification on CT. No active angina. Apparent ischemic work up with stress testing at some point in the past.  4. HLD. On statin with excellent control 5. DM on insulin per primary care.  6. Asthma.Controlled. 7. CKD stage 3. Last creatinine 1.3.  8. HTN well controlled.  9. Thoracic aortic aneurysm. 4.9 cm.Will need yearly CT. Marland Kitchen  Recommend avoidance of fluoroquinolone antibiotics. If aneurysm increases in size will refer to CT surgery 10. History of Etoh abuse. Cirrhosis noted on CT. Recommend abstinence. He has quit currently.    Current medicines are reviewed at length with the patient today.  The patient does not have concerns regarding medicines.  The following changes have  been made:  no change  Labs/ tests ordered today include:   No orders of the defined types were placed in this encounter.    Disposition:   FU with me in 6 months  Signed, Efrat Zuidema Martinique, MD  07/30/2020 10:21 AM    [facility] [address], [place], [place], [address] Phone [phone], Fax [phone]

## 2020-07-30 ENCOUNTER — Ambulatory Visit (INDEPENDENT_AMBULATORY_CARE_PROVIDER_SITE_OTHER): Payer: Medicare Other | Admitting: Cardiology

## 2020-07-30 ENCOUNTER — Other Ambulatory Visit: Payer: Self-pay

## 2020-07-30 ENCOUNTER — Encounter: Payer: Self-pay | Admitting: Cardiology

## 2020-07-30 VITALS — BP 120/80 | HR 54 | Ht 69.0 in | Wt 199.8 lb

## 2020-07-30 DIAGNOSIS — I10 Essential (primary) hypertension: Secondary | ICD-10-CM

## 2020-07-30 DIAGNOSIS — I712 Thoracic aortic aneurysm, without rupture, unspecified: Secondary | ICD-10-CM

## 2020-07-30 DIAGNOSIS — I2510 Atherosclerotic heart disease of native coronary artery without angina pectoris: Secondary | ICD-10-CM

## 2020-07-30 DIAGNOSIS — I480 Paroxysmal atrial fibrillation: Secondary | ICD-10-CM | POA: Diagnosis not present

## 2020-07-30 DIAGNOSIS — I5022 Chronic systolic (congestive) heart failure: Secondary | ICD-10-CM | POA: Diagnosis not present

## 2020-07-30 MED ORDER — ENTRESTO 24-26 MG PO TABS: 1.0000 | ORAL_TABLET | Freq: Two times a day (BID) | ORAL | 1 refills | Status: DC

## 2020-07-30 NOTE — Patient Instructions (Signed)
Medication Instructions:  START ENTRESTO 24/26 (1 Tablet) Twice DAILY IN 3 DAYS  YOU HAVE TO STOP AND BE OFF OF LISINOPRIL FOR 3 DAYS PRIOR TO STARTING ENTRESTO 1 bottle of samples given of Entresto 24/26 (LOT [id number]) EXP: 2023 *If you need a refill on your cardiac medications before your next appointment, please call your pharmacy*  Lab Work: None Ordered At This Time.  If you have labs (blood work) drawn today and your tests are completely normal, you will receive your results only by: Marland Kitchen MyChart Message (if you have MyChart) OR . A paper copy in the mail If you have any lab test that is abnormal or we need to change your treatment, we will call you to review the results.  Testing/Procedures: None Ordered At This Time.   Follow-Up: At [facility], you and your health needs are our priority.  As part of our continuing mission to provide you with exceptional heart care, we have created designated Provider Care Teams.  These Care Teams include your primary Cardiologist (physician) and Advanced Practice Providers (APPs -  Physician Assistants and Nurse Practitioners) who all work together to provide you with the care you need, when you need it.  Your next appointment:   6 month(s)  The format for your next appointment:   In Person  Provider:   Peter Martinique, MD  Other Instructions PLEASE FOLLOW UP WITH [facility] (PHARMACY) in [place].

## 2020-08-16 ENCOUNTER — Other Ambulatory Visit: Payer: Self-pay | Admitting: Family Medicine

## 2020-08-20 ENCOUNTER — Ambulatory Visit (INDEPENDENT_AMBULATORY_CARE_PROVIDER_SITE_OTHER): Payer: Medicare Other | Admitting: Pharmacist Clinician (PhC)/ Clinical Pharmacy Specialist

## 2020-08-20 ENCOUNTER — Other Ambulatory Visit: Payer: Self-pay

## 2020-08-20 VITALS — BP 96/59 | HR 60 | Ht 69.0 in | Wt 201.4 lb

## 2020-08-20 DIAGNOSIS — R7989 Other specified abnormal findings of blood chemistry: Secondary | ICD-10-CM

## 2020-08-20 DIAGNOSIS — I5022 Chronic systolic (congestive) heart failure: Secondary | ICD-10-CM

## 2020-08-20 DIAGNOSIS — I509 Heart failure, unspecified: Secondary | ICD-10-CM

## 2020-08-20 LAB — BASIC METABOLIC PANEL
BUN/Creatinine Ratio: 19 (ref 10–24)
BUN: 30 mg/dL — ABNORMAL HIGH (ref 8–27)
CO2: 31 mmol/L — ABNORMAL HIGH (ref 20–29)
Calcium: 9.5 mg/dL (ref 8.6–10.2)
Chloride: 94 mmol/L — ABNORMAL LOW (ref 96–106)
Creatinine, Ser: 1.55 mg/dL — ABNORMAL HIGH (ref 0.76–1.27)
GFR calc Af Amer: 52 mL/min/{1.73_m2} — ABNORMAL LOW (ref >59)
GFR calc non Af Amer: 45 mL/min/{1.73_m2} — ABNORMAL LOW (ref >59)
Glucose: 297 mg/dL — ABNORMAL HIGH (ref 65–99)
Potassium: 5.2 mmol/L (ref 3.5–5.2)
Sodium: 137 mmol/L (ref 134–144)

## 2020-08-20 NOTE — Assessment & Plan Note (Signed)
Patient with HFrEF, last echo showing at 35-40%.  Patient is currently on GDMT with Entresto, carvedilol, spironolactone and empagliflozin.  Will repeat metabolic panel today.  No room in blood pressure to increase Entresto at this time, so will continue with the 24/26 mg dose.  He should continue to monitor home blood pressure readings and let us know if he develops any symptoms of hypotension or has other concerns.

## 2020-08-20 NOTE — Progress Notes (Signed)
08/20/2020 Theodore Bush January 10, 1951 [id number]   HPI:  Theodore Bush is a 69 y.o. male patient of Dr Martinique, with a [place] below who presents today for heart failure medication titration.  He saw Dr. Martinique on November 12 and was on lisinopril at that time.   It was held for 36 hours and he was started on Entresto 24/26 mg.  Most recent echo (06/2019) showed an EF of 35-40%. He is also on carvedilol, spironolactone and empagliflozin.    Today he returns for Entresto dose titration.  He has been feeling well overall and notes no problems or side effects from the switch.  He has not noticed any changes in his energy levels.    Past Medical History: Atrial fibrillation CHADS2-VASc score 5 (CHF, HTN, AGE, DM2, CAD) - on Xarelto  Thoracic aortic aneurysm 4.9 cm at last CT; reviewed yearly  ASCVD Based on heavy coronary calcifications from CT  DM2 A1c 8/21:  6.9 - on Jardiance 25, Lantus 40 U, metformin 1,000 bid  hyperlipidemia 3/21: TC 132, TG 118, HDL 52, LDL 59 - on atorvastatin 80, fenofibrate 50  asthma On albuterol MDI, montelukast, symbicort MDI  CKD Stage 3 SCr 1.3 (06/2020)     Blood Pressure Goal:  130/80  Current Medications: Entresto 24/26 mg bid, carvedilol 25 mg bid, spironolactone 25 mg qod, empagliflozin 25 mg qd  Family Hx: mother died from cancer, father currently 71, no cardiac issues; 3 sisters, 2 half siblings (father) - none with cardiac issues; 2 kids also w/o issues  Social Hx: chews tobacco -1 can per week; no alcohol; 1-2 coffee per day  Diet: mostly home cooked foods; only small amounts of salt with cooking; plenty of fruits/veggies  Exercise: walks dog daily, uses resistance weights/dumbells  Home BP readings: home readings on watch, mostly 650-354 systolic but not any higher, nothing below 60 diastolic  Intolerances: DO NOT USE FLOROQUINOLONES   Labs: 8/21:  Na 139, K 4.7, Glu 192, BUN 26, SCr 1.38 GFR 52  Wt Readings from Last 3 Encounters:  08/20/20  201 lb 6.4 oz (91.4 kg)  07/30/20 199 lb 12.8 oz (90.6 kg)  07/01/20 205 lb (93 kg)   BP Readings from Last 3 Encounters:  08/20/20 (!) 96/59  07/30/20 120/80  05/13/20 120/70   Pulse Readings from Last 3 Encounters:  08/20/20 60  07/30/20 (!) 54  05/13/20 83    Current Outpatient Medications  Medication Sig Dispense Refill  . Adalimumab (HUMIRA PEN) 40 MG/0.4ML PNKT Inject 1 each into the skin. 2 times a months.    Marland Kitchen albuterol (PROAIR HFA) 108 (90 Base) MCG/ACT inhaler Inhale 1 puff into the lungs every 6 (six) hours as needed for wheezing or shortness of breath. 18 g 5  . atorvastatin (LIPITOR) 80 MG tablet Take 1 tablet (80 mg total) by mouth daily. 90 tablet 2  . carvedilol (COREG) 25 MG tablet Take 1 tablet (25 mg total) by mouth 2 (two) times daily. 180 tablet 2  . Fenofibrate 50 MG CAPS TAKE 1 CAPSULE(50 MG) BY MOUTH DAILY 90 capsule 0  . furosemide (LASIX) 20 MG tablet TAKE 1 TABLET(20 MG) BY MOUTH DAILY 90 tablet 2  . glucose blood (ACCU-CHEK GUIDE) test strip Test 1-2 a day. Pt has accu- chek meter 100 each 2  . HYDROcodone-acetaminophen (NORCO) 10-325 MG tablet Take 1 tablet by mouth every 6 (six) hours as needed.    . Insulin Pen Needle (PEN NEEDLES) 32G X 4 MM  MISC 1 each by Other route daily. 100 each 2  . JARDIANCE 25 MG TABS tablet TAKE 1 TABLET BY MOUTH EVERY DAY 90 tablet 1  . LANTUS SOLOSTAR 100 UNIT/ML Solostar Pen ADMINISTER 40 UNITS UNDER THE SKIN DAILY 15 mL 0  . metFORMIN (GLUCOPHAGE) 1000 MG tablet TAKE 1 TABLET(1000 MG) BY MOUTH TWICE DAILY WITH A MEAL 180 tablet 0  . montelukast (SINGULAIR) 10 MG tablet TAKE 1 TABLET(10 MG) BY MOUTH AT BEDTIME 30 tablet 5  . rivaroxaban (XARELTO) 20 MG TABS tablet TAKE 1 TABLET(20 MG) BY MOUTH DAILY WITH SUPPER 90 tablet 2  . sacubitril-valsartan (ENTRESTO) 24-26 MG Take 1 tablet by mouth 2 (two) times daily. 60 tablet 1  . spironolactone (ALDACTONE) 25 MG tablet TAKE 1 TABLET BY MOUTH EVERY 2 DAYS FOR 90 DAYS 45 tablet 1   . SYMBICORT 160-4.5 MCG/ACT inhaler INHALE 2 PUFFS INTO THE LUNGS TWICE DAILY 10.2 g 1   No current facility-administered medications for this visit.    Allergies  Allergen Reactions  . Quinolones     Past Medical History:  Diagnosis Date  . Aortic arch atherosclerosis ([place]) 06/11/2019  . Asthma   . Atrial fibrillation ([place]) 09/02/2018  . CHF (congestive heart failure) ([place])   . Controlled diabetes mellitus type 2 with complications ([place]) 51/10/5850  . Coronary artery calcification 06/11/2019  . Hyperlipidemia   . Hypertension   . Pneumonia 2019  . Staph infection    to elbow 15-34yrs ago    Blood pressure (!) 96/59, pulse 60, height 5\' 9"  (1.753 m), weight 201 lb 6.4 oz (91.4 kg).  Chronic congestive heart failure ([place]) Patient with HFrEF, last echo showing at 35-40%.  Patient is currently on GDMT with Entresto, carvedilol, spironolactone and empagliflozin.  Will repeat metabolic panel today.  No room in blood pressure to increase Entresto at this time, so will continue with the 24/26 mg dose.  He should continue to monitor home blood pressure readings and let us know if he develops any symptoms of hypotension or has other concerns.     Tommy Medal PharmD CPP [facility] [address] [place], [place] [address] [phone]

## 2020-08-20 NOTE — Patient Instructions (Signed)
Go to the lab today to check kidney function and electrolytes  Check your blood pressure at home several times each week and keep record of the readings.  Take your BP meds as follows:  Continue with all current medications  Bring all of your meds, your BP cuff and your record of home blood pressures to your next appointment.  Exercise as you're able, try to walk approximately 30 minutes per day.  Keep salt intake to a minimum, especially watch canned and prepared boxed foods.  Eat more fresh fruits and vegetables and fewer canned items.  Avoid eating in fast food restaurants.    HOW TO TAKE YOUR BLOOD PRESSURE: . Rest 5 minutes before taking your blood pressure. .  Don't smoke or drink caffeinated beverages for at least 30 minutes before. . Take your blood pressure before (not after) you eat. . Sit comfortably with your back supported and both feet on the floor (don't cross your legs). . Elevate your arm to heart level on a table or a desk. . Use the proper sized cuff. It should fit smoothly and snugly around your bare upper arm. There should be enough room to slip a fingertip under the cuff. The bottom edge of the cuff should be 1 inch above the crease of the elbow. . Ideally, take 3 measurements at one sitting and record the average.

## 2020-08-26 ENCOUNTER — Other Ambulatory Visit: Payer: Self-pay | Admitting: Family Medicine

## 2020-08-26 NOTE — Telephone Encounter (Signed)
Pt has an appt in [place]

## 2020-09-04 ENCOUNTER — Other Ambulatory Visit: Payer: Self-pay | Admitting: Family Medicine

## 2020-09-13 ENCOUNTER — Telehealth: Payer: Self-pay

## 2020-09-13 NOTE — Telephone Encounter (Signed)
Recv'd fax from [employer] t#[phone] [place] that generic Symbicort requires P.A. called pharmacy spent over 30 minutes on hold and still couldn't reach anyone, I sent a fax stating must be ran as brand Symbicort

## 2020-09-16 ENCOUNTER — Other Ambulatory Visit: Payer: Self-pay | Admitting: Pulmonary Disease

## 2020-09-19 ENCOUNTER — Other Ambulatory Visit: Payer: Self-pay | Admitting: Family Medicine

## 2020-09-20 MED ORDER — MONTELUKAST SODIUM 10 MG PO TABS
10.0000 mg | ORAL_TABLET | Freq: Every day | ORAL | 0 refills | Status: DC
Start: 2020-09-20 — End: 2020-10-20

## 2020-09-22 ENCOUNTER — Ambulatory Visit: Payer: Medicare Other | Admitting: Family Medicine

## 2020-10-08 ENCOUNTER — Other Ambulatory Visit: Payer: Self-pay | Admitting: Family Medicine

## 2020-10-13 ENCOUNTER — Other Ambulatory Visit: Payer: Self-pay | Admitting: Family Medicine

## 2020-10-14 ENCOUNTER — Telehealth: Payer: Self-pay | Admitting: Cardiology

## 2020-10-14 MED ORDER — ENTRESTO 24-26 MG PO TABS
1.0000 | ORAL_TABLET | Freq: Two times a day (BID) | ORAL | 1 refills | Status: DC
Start: 2020-10-14 — End: 2020-12-06

## 2020-10-14 NOTE — Telephone Encounter (Signed)
°*  STAT* If patient is at the pharmacy, call can be transferred to refill team.   1. Which medications need to be refilled? (please list name of each medication and dose if known) Entresto   2. Which pharmacy/location (including street and city if local pharmacy) is medication to be sent to? [facility]  3. Do they need a 30 day or 90 day supply? 60 pills or 90

## 2020-10-20 ENCOUNTER — Other Ambulatory Visit: Payer: Self-pay | Admitting: Pulmonary Disease

## 2020-10-25 ENCOUNTER — Other Ambulatory Visit: Payer: Self-pay | Admitting: Family Medicine

## 2020-10-29 ENCOUNTER — Other Ambulatory Visit: Payer: Self-pay | Admitting: Family Medicine

## 2020-10-29 NOTE — Telephone Encounter (Signed)
This was filled on 10/25/20 # 27

## 2020-11-29 ENCOUNTER — Other Ambulatory Visit: Payer: Self-pay | Admitting: Family Medicine

## 2020-11-30 ENCOUNTER — Other Ambulatory Visit: Payer: Self-pay | Admitting: Family Medicine

## 2020-11-30 NOTE — Telephone Encounter (Signed)
Has an appt in may 2022

## 2020-12-03 ENCOUNTER — Other Ambulatory Visit: Payer: Self-pay | Admitting: Family Medicine

## 2020-12-06 ENCOUNTER — Other Ambulatory Visit: Payer: Self-pay | Admitting: Cardiology

## 2020-12-08 ENCOUNTER — Telehealth: Payer: Self-pay | Admitting: Cardiology

## 2020-12-08 MED ORDER — CARVEDILOL 25 MG PO TABS
ORAL_TABLET | ORAL | 3 refills | Status: DC
Start: 2020-12-08 — End: 2021-01-18

## 2020-12-08 MED ORDER — ENTRESTO 24-26 MG PO TABS
1.0000 | ORAL_TABLET | Freq: Two times a day (BID) | ORAL | 3 refills | Status: DC
Start: 2020-12-08 — End: 2021-01-18

## 2020-12-08 NOTE — Telephone Encounter (Signed)
*  STAT* If patient is at the pharmacy, call can be transferred to refill team.   1. Which medications need to be refilled? (please list name of each medication and dose if known) ENTRESTO 24-26 MG carvedilol (COREG) 25 MG tablet  2. Which pharmacy/location (including street and city if local pharmacy) is medication to be sent to? [id number] - [place], [address]  3. Do they need a 30 day or 90 day supply? [place]

## 2020-12-12 ENCOUNTER — Other Ambulatory Visit: Payer: Self-pay | Admitting: Family Medicine

## 2020-12-13 NOTE — Telephone Encounter (Signed)
Pt has an appt in May  

## 2020-12-18 ENCOUNTER — Other Ambulatory Visit: Payer: Self-pay | Admitting: Family Medicine

## 2020-12-20 NOTE — Telephone Encounter (Signed)
This was just refilled last week.

## 2020-12-24 ENCOUNTER — Other Ambulatory Visit: Payer: Self-pay | Admitting: Pulmonary Disease

## 2020-12-30 ENCOUNTER — Other Ambulatory Visit: Payer: Self-pay | Admitting: Family Medicine

## 2021-01-04 ENCOUNTER — Telehealth: Payer: Self-pay | Admitting: Cardiology

## 2021-01-04 DIAGNOSIS — Z7901 Long term (current) use of anticoagulants: Secondary | ICD-10-CM

## 2021-01-04 DIAGNOSIS — I4819 Other persistent atrial fibrillation: Secondary | ICD-10-CM

## 2021-01-04 MED ORDER — RIVAROXABAN 20 MG PO TABS: ORAL_TABLET | ORAL | 2 refills | Status: DC

## 2021-01-04 NOTE — Telephone Encounter (Signed)
*  STAT* If patient is at the pharmacy, call can be transferred to refill team.   1. Which medications need to be refilled? (please list name of each medication and dose if known) rivaroxaban (XARELTO) 20 MG TABS tablet  2. Which pharmacy/location (including street and city if local pharmacy) is medication to be sent to? [id number] - [place], [place] [address] AT [place]  3. Do they need a 30 day or 90 day supply? [place]

## 2021-01-08 ENCOUNTER — Other Ambulatory Visit: Payer: Self-pay | Admitting: Family Medicine

## 2021-01-12 ENCOUNTER — Other Ambulatory Visit: Payer: Self-pay | Admitting: Family Medicine

## 2021-01-12 NOTE — Telephone Encounter (Signed)
Pt has an appt in may °

## 2021-01-18 ENCOUNTER — Other Ambulatory Visit: Payer: Self-pay | Admitting: Family Medicine

## 2021-01-18 ENCOUNTER — Telehealth: Payer: Self-pay | Admitting: Cardiology

## 2021-01-18 MED ORDER — CARVEDILOL 25 MG PO TABS
ORAL_TABLET | ORAL | 3 refills | Status: DC
Start: 2021-01-18 — End: 2021-08-17

## 2021-01-18 MED ORDER — ENTRESTO 24-26 MG PO TABS
1.0000 | ORAL_TABLET | Freq: Two times a day (BID) | ORAL | 3 refills | Status: DC
Start: 2021-01-18 — End: 2022-01-06

## 2021-01-18 NOTE — Telephone Encounter (Signed)
*  STAT* If patient is at the pharmacy, call can be transferred to refill team.   1. Which medications need to be refilled? (please list name of each medication and dose if known)  carvedilol (COREG) 25 MG tablet sacubitril-valsartan (ENTRESTO) 24-26 MG montelukast (SINGULAIR) 10 MG tablet  2. Which pharmacy/location (including street and city if local pharmacy) is medication to be sent to? [id number] - [place], [place] [address] AT [place]  3. Do they need a 30 day or 90 day supply? 90 day supply  PT states he is going to [place] on May 17th 2022 and he does not have enough of the medication to last him on his trip.PT also states he is completely out of the montelukast.

## 2021-02-09 ENCOUNTER — Other Ambulatory Visit: Payer: Self-pay | Admitting: Family Medicine

## 2021-02-11 ENCOUNTER — Ambulatory Visit: Payer: Medicare Other | Admitting: Family Medicine

## 2021-02-18 ENCOUNTER — Other Ambulatory Visit: Payer: Self-pay | Admitting: Family Medicine

## 2021-02-22 ENCOUNTER — Telehealth: Payer: Self-pay | Admitting: Internal Medicine

## 2021-02-22 NOTE — Telephone Encounter (Signed)
LMTCB, wanted to find out if pt has had an eye exam in the last year, if so where at and if not would he like to schedule for our free eye exam on Tuesday June 14th.

## 2021-02-26 ENCOUNTER — Other Ambulatory Visit: Payer: Self-pay | Admitting: Family Medicine

## 2021-02-28 NOTE — Telephone Encounter (Signed)
Pt has an appt in august 

## 2021-03-16 ENCOUNTER — Encounter: Payer: Self-pay | Admitting: Internal Medicine

## 2021-03-18 ENCOUNTER — Other Ambulatory Visit: Payer: Self-pay | Admitting: Family Medicine

## 2021-03-24 ENCOUNTER — Other Ambulatory Visit: Payer: Self-pay | Admitting: Family Medicine

## 2021-03-29 ENCOUNTER — Other Ambulatory Visit: Payer: Self-pay | Admitting: Family Medicine

## 2021-03-29 NOTE — Telephone Encounter (Signed)
Pt has an appt august 19th

## 2021-03-31 ENCOUNTER — Encounter: Payer: Self-pay | Admitting: Internal Medicine

## 2021-04-12 ENCOUNTER — Telehealth: Payer: Self-pay | Admitting: Family Medicine

## 2021-04-12 NOTE — Telephone Encounter (Signed)
Pharmacy sent refill request for metformin please send to the [id number] [place], [place] AT [place]

## 2021-04-14 ENCOUNTER — Telehealth: Payer: Self-pay | Admitting: Family Medicine

## 2021-04-14 NOTE — Telephone Encounter (Signed)
Pharmacy sent refill request for metformin and spironolactone  [facility]

## 2021-04-15 ENCOUNTER — Other Ambulatory Visit: Payer: Self-pay

## 2021-04-15 MED ORDER — METFORMIN HCL 1000 MG PO TABS
ORAL_TABLET | ORAL | 0 refills | Status: DC
Start: 2021-04-15 — End: 2021-04-18

## 2021-04-15 MED ORDER — SPIRONOLACTONE 25 MG PO TABS
ORAL_TABLET | ORAL | 0 refills | Status: DC
Start: 2021-04-15 — End: 2021-04-18

## 2021-04-18 MED ORDER — METFORMIN HCL 1000 MG PO TABS: ORAL_TABLET | ORAL | 0 refills | Status: DC

## 2021-04-18 MED ORDER — SPIRONOLACTONE 25 MG PO TABS: ORAL_TABLET | ORAL | 0 refills | Status: DC

## 2021-04-18 NOTE — Telephone Encounter (Signed)
done

## 2021-04-19 ENCOUNTER — Other Ambulatory Visit: Payer: Self-pay | Admitting: Medical

## 2021-04-19 ENCOUNTER — Telehealth: Payer: Self-pay | Admitting: Family Medicine

## 2021-04-19 MED ORDER — BUDESONIDE-FORMOTEROL FUMARATE 160-4.5 MCG/ACT IN AERO: 2.0000 | INHALATION_SPRAY | Freq: Two times a day (BID) | RESPIRATORY_TRACT | 0 refills | Status: DC

## 2021-04-19 NOTE — Telephone Encounter (Signed)
[facility] req Symbicort 160/4.5 mcg                pt has med ck with Audelia Acton 05/06/21.

## 2021-04-29 ENCOUNTER — Ambulatory Visit: Payer: Self-pay | Admitting: Family Medicine

## 2021-05-02 ENCOUNTER — Telehealth: Payer: Self-pay | Admitting: Family Medicine

## 2021-05-02 ENCOUNTER — Telehealth (INDEPENDENT_AMBULATORY_CARE_PROVIDER_SITE_OTHER): Payer: Medicare Other | Admitting: Medical

## 2021-05-02 ENCOUNTER — Other Ambulatory Visit: Payer: Self-pay

## 2021-05-02 VITALS — BP 115/70 | HR 73 | Temp 97.8°F | Wt 205.0 lb

## 2021-05-02 DIAGNOSIS — R059 Cough, unspecified: Secondary | ICD-10-CM

## 2021-05-02 DIAGNOSIS — U071 COVID-19: Secondary | ICD-10-CM

## 2021-05-02 MED ORDER — ACCU-CHEK AVIVA PLUS VI STRP: ORAL_STRIP | 1 refills | Status: DC

## 2021-05-02 MED ORDER — MOLNUPIRAVIR EUA 200MG CAPSULE: 4.0000 | ORAL_CAPSULE | Freq: Two times a day (BID) | ORAL | 0 refills | Status: AC

## 2021-05-02 NOTE — Telephone Encounter (Signed)
[facility] req AccuCheck Aviva Plus Strips 100S   Test 1-2 times a day

## 2021-05-02 NOTE — Telephone Encounter (Signed)
done

## 2021-05-02 NOTE — Progress Notes (Signed)
Subjective:     Patient ID: Theodore Bush, male   DOB: 1951/05/15, 70 y.o.   MRN: [id number]  This visit type was conducted due to national recommendations for restrictions regarding the COVID-19 Pandemic (e.g. social distancing) in an effort to limit this patient's exposure and mitigate transmission in our community.  Due to their co-morbid illnesses, this patient is at least at moderate risk for complications without adequate follow up.  This format is felt to be most appropriate for this patient at this time.    Documentation for virtual audio and video telecommunications through [place] encounter:  The patient was located at home. The provider was located in the office. The patient did consent to this visit and is aware of possible charges through their insurance for this visit.  The other persons participating in this telemedicine service were none. Time spent on call was 20 minutes and in review of previous records 20 minutes total.  This virtual service is not related to other E/M service within previous 7 days.   HPI Chief Complaint  Patient presents with   Covid Positive    Tested positive for covid Friday. Symptoms started Friday- achy, coughing up phelgm and headache, some breathing issue.    Virtual consult for illness.   Started feeling bad last week about 4 days ago.   Had 2 positive tests since 4 days ago, coworker also recently positive.   Symptoms include headache, cough, aches, feels like fluid in lungs, gurgling sound in throat.   Mild breathing issues currently.  Has hx/o COPD/asthma, on medication for this.   Overall not too bad so far, more like mild cold symptoms.   Never had covid prior.   Wife had it previously and did horrible with it back in 2020.    She has been dealing with long covid.  No fever, no NVD.  Headache is throb, mild.  No loss of taste or smell.  Appetite is still good.  Using OTC cough syrup, tylenol and zycam.  Blood sugars have been running a  little higher.  Usually is less than 130.  Lately running as high as 180 fasting.  Currently takes 30 units of Lantus daily  He uses Humira for psoriatic arthritis.  Next injection is due in the next few days  He does have COPD.  He uses Symbicort twice daily.  He has been using a little bit more albuterol recently.  He has a home pulse oximeter which has been running about 98%.  He has never been below 96%.  No recent weight fluctuation  No other aggravating or relieving factors. No other complaint.  Past Medical History:  Diagnosis Date   Aortic arch atherosclerosis ([place]) 06/11/2019   Asthma    Atrial fibrillation ([place]) 09/02/2018   CHF (congestive heart failure) ([place])    Controlled diabetes mellitus type 2 with complications ([place]) [id number]   Coronary artery calcification 06/11/2019   Hyperlipidemia    Hypertension    Pneumonia 2019   Staph infection    to elbow 15-61yr ago   Current Outpatient Medications on File Prior to Visit  Medication Sig Dispense Refill   Adalimumab 40 MG/0.4ML PNKT Inject 1 each into the skin. 2 times a months.     albuterol (VENTOLIN HFA) 108 (90 Base) MCG/ACT inhaler INHALE 1 PUFF INTO THE LUNGS EVERY 6 HOURS AS NEEDED FOR WHEEZING OR SHORTNESS OF BREATH 18 g 0   atorvastatin (LIPITOR) 80 MG tablet Take 1 tablet (80 mg total) by mouth  daily. 90 tablet 2   budesonide-formoterol (SYMBICORT) 160-4.5 MCG/ACT inhaler Inhale 2 puffs into the lungs 2 (two) times daily. 10.2 g 0   carvedilol (COREG) 25 MG tablet TAKE 1 TABLET(25 MG) BY MOUTH TWICE DAILY 180 tablet 3   Fenofibrate 50 MG CAPS TAKE 1 CAPSULE(50 MG) BY MOUTH DAILY 90 capsule 0   glucose blood (ACCU-CHEK AVIVA PLUS) test strip TEST 1 TO 2 TIMES A DAY 100 strip 1   HYDROcodone-acetaminophen (NORCO) 10-325 MG tablet Take 1 tablet by mouth every 6 (six) hours as needed.     Insulin Pen Needle (PEN NEEDLES) 32G X 4 MM MISC 1 each by Other route daily. 100 each 2   JARDIANCE 25 MG TABS tablet TAKE 1  TABLET BY MOUTH EVERY DAY 90 tablet 0   LANTUS SOLOSTAR 100 UNIT/ML Solostar Pen ADMINISTER 40 UNITS UNDER THE SKIN DAILY 3 mL 0   metFORMIN (GLUCOPHAGE) 1000 MG tablet TAKE 1 TABLET(1000 MG) BY MOUTH TWICE DAILY WITH A MEAL 180 tablet 0   montelukast (SINGULAIR) 10 MG tablet TAKE 1 TABLET(10 MG) BY MOUTH DAILY 90 tablet 1   rivaroxaban (XARELTO) 20 MG TABS tablet TAKE 1 TABLET(20 MG) BY MOUTH DAILY WITH SUPPER 90 tablet 2   sacubitril-valsartan (ENTRESTO) 24-26 MG Take 1 tablet by mouth 2 (two) times daily. 180 tablet 3   spironolactone (ALDACTONE) 25 MG tablet TAKE 1 TABLET BY MOUTH EVERY 2 DAYS 45 tablet 0   No current facility-administered medications on file prior to visit.    Review of Systems As in subjective    Objective:   Physical Exam Due to coronavirus pandemic stay at home measures, patient visit was virtual and they were not examined in person.   BP 115/70   Pulse 73   Temp 97.8 F (36.6 C)   Wt 205 lb (93 kg)   BMI 30.27 kg/m   Gen: wd, wn, nad No labored breathing, answers questions in complete sentences     Assessment:     Encounter Diagnoses  Name Primary?   COVID-19 virus infection Yes   Cough        Plan:     We discussed symptoms and concerns.  We discussed that he is higher risk for severe disease given his underlying health issues.  Fortunately he does not have severe symptoms currently.  We discussed supportive measures.  I recommended he begin Molnupiravir to reduce risk of severe disease or hospitalization.  We discussed risk and benefits and proper use of medication.  I advise he not use Humira this week as scheduled until he is much improved over the next 2 weeks.  Your recent coronavirus/Covid 19 test result was indeed positive!    General recommendations: I recommend you rest, hydrate well with water and clear fluids throughout the day.   You can use Tylenol for pain or fever You can use over the counter Mucinex DM for cough. You can  use over the counter Emetrol for nausea.     If you are having trouble breathing, if you are very weak, have high fever 103 or higher consistently despite Tylenol, or uncontrollable nausea and vomiting, then call or go to the emergency department.    If you have other questions or have other symptoms or questions you are concerned about then please make a virtual visit  Covid symptoms such as fatigue and cough can linger over 2 weeks, even after the initial fever, aches, chills, and other initial symptoms.   Self Quarantine:  The CDC, Centers for Disease Control has recommended a self quarantine of 5 days from the start of your illness until you are symptom-free including at least 24 hours of no symptoms including no fever, no shortness of breath, and no body aches and chills, by day 5 before returning to work or general contact with the public.  What does self quarantine mean: avoiding contact with people as much as possible.   Particularly in your house, isolate your self from others in a separate room, wear a mask when possible in the room, particularly if coughing a lot.   Have others bring food, water, medications, etc., to your door, but avoid direct contact with your household contacts during this time to avoid spreading the infection to them.   If you have a separate bathroom and living quarters during the next 2 weeks away from others, that would be preferable.    If you can't completely isolate, then wear a mask, wash hands frequently with soap and water for at least 15 seconds, minimize close contact with others, and have a friend or family member check regularly from a distance to make sure you are not getting seriously worse.     You should not be going out in public, should not be going to stores, to work or other public places until all your symptoms have resolved and at least 5 days + 24 hours of no symptoms at all have transpired.   Ideally you should avoid contact with others for a  full 5 days if possible.  One of the goals is to limit spread to high risk people; people that are older and elderly, people with multiple health issues like diabetes, heart disease, lung disease, and anybody that has weakened immune systems such as people with cancer or on immunosuppressive therapy.    Imanol was seen today for covid positive.  Diagnoses and all orders for this visit:  COVID-19 virus infection  Cough  Other orders -     molnupiravir EUA 200 mg CAPS; Take 4 capsules (800 mg total) by mouth 2 (two) times daily for 5 days.  F/u prn

## 2021-05-04 ENCOUNTER — Other Ambulatory Visit: Payer: Self-pay | Admitting: Pulmonary Disease

## 2021-05-06 ENCOUNTER — Encounter: Payer: Medicare Other | Admitting: Medical

## 2021-05-10 ENCOUNTER — Telehealth: Payer: Self-pay | Admitting: Family Medicine

## 2021-05-10 MED ORDER — BUDESONIDE-FORMOTEROL FUMARATE 160-4.5 MCG/ACT IN AERO: 2.0000 | INHALATION_SPRAY | Freq: Two times a day (BID) | RESPIRATORY_TRACT | 0 refills | Status: DC

## 2021-05-10 MED ORDER — ALBUTEROL SULFATE HFA 108 (90 BASE) MCG/ACT IN AERS: INHALATION_SPRAY | RESPIRATORY_TRACT | 0 refills | Status: DC

## 2021-05-10 MED ORDER — LANTUS SOLOSTAR 100 UNIT/ML ~~LOC~~ SOPN: PEN_INJECTOR | SUBCUTANEOUS | 0 refills | Status: DC

## 2021-05-10 NOTE — Telephone Encounter (Signed)
Pt called for refills of Lantus and albuterol. [facility] sending to [place] as Loletha C[facility] is out.

## 2021-05-10 NOTE — Telephone Encounter (Signed)
Sent meds in

## 2021-05-25 ENCOUNTER — Other Ambulatory Visit: Payer: Self-pay

## 2021-05-25 ENCOUNTER — Telehealth: Payer: Self-pay | Admitting: Family Medicine

## 2021-05-25 ENCOUNTER — Telehealth: Payer: Self-pay

## 2021-05-25 MED ORDER — EMPAGLIFLOZIN 25 MG PO TABS: 25.0000 mg | ORAL_TABLET | Freq: Every day | ORAL | 0 refills | Status: DC

## 2021-05-25 MED ORDER — EMPAGLIFLOZIN 25 MG PO TABS
25.0000 mg | ORAL_TABLET | Freq: Every day | ORAL | 0 refills | Status: DC
Start: 2021-05-25 — End: 2021-11-28

## 2021-05-25 MED ORDER — LANTUS SOLOSTAR 100 UNIT/ML ~~LOC~~ SOPN: PEN_INJECTOR | SUBCUTANEOUS | 0 refills | Status: DC

## 2021-05-25 NOTE — Telephone Encounter (Signed)
Fax refill request from [facility] 3 ML   Jardiance '25mg'$ 

## 2021-05-25 NOTE — Telephone Encounter (Signed)
Done KH 

## 2021-05-25 NOTE — Telephone Encounter (Signed)
sent 

## 2021-05-25 NOTE — Telephone Encounter (Signed)
Recv'd refill request from [facility] for Jardiance #[id number]

## 2021-05-31 NOTE — Telephone Encounter (Signed)
DONE

## 2021-06-03 ENCOUNTER — Telehealth: Payer: Self-pay | Admitting: Cardiology

## 2021-06-03 MED ORDER — ATORVASTATIN CALCIUM 80 MG PO TABS: 80.0000 mg | ORAL_TABLET | Freq: Every day | ORAL | 2 refills | Status: DC

## 2021-06-03 NOTE — Telephone Encounter (Signed)
*  STAT* If patient is at the pharmacy, call can be transferred to refill team.   1. Which medications need to be refilled? (please list name of each medication and dose if known)  atorvastatin (LIPITOR) 80 MG tablet  2. Which pharmacy/location (including street and city if local pharmacy) is medication to be sent to? [id number] - [place], [place] [address] AT [place]  3. Do they need a 30 day or 90 day supply? 90 day supply

## 2021-06-10 ENCOUNTER — Other Ambulatory Visit: Payer: Self-pay

## 2021-06-10 ENCOUNTER — Ambulatory Visit (INDEPENDENT_AMBULATORY_CARE_PROVIDER_SITE_OTHER): Payer: Medicare Other | Admitting: Family Medicine

## 2021-06-10 ENCOUNTER — Encounter: Payer: Self-pay | Admitting: Gastroenterology

## 2021-06-10 ENCOUNTER — Encounter: Payer: Self-pay | Admitting: Family Medicine

## 2021-06-10 VITALS — BP 130/84 | HR 65 | Temp 98.5°F | Wt 200.2 lb

## 2021-06-10 DIAGNOSIS — Z7901 Long term (current) use of anticoagulants: Secondary | ICD-10-CM | POA: Diagnosis not present

## 2021-06-10 DIAGNOSIS — Z23 Encounter for immunization: Secondary | ICD-10-CM

## 2021-06-10 DIAGNOSIS — N189 Chronic kidney disease, unspecified: Secondary | ICD-10-CM

## 2021-06-10 DIAGNOSIS — E782 Mixed hyperlipidemia: Secondary | ICD-10-CM

## 2021-06-10 DIAGNOSIS — G4733 Obstructive sleep apnea (adult) (pediatric): Secondary | ICD-10-CM

## 2021-06-10 DIAGNOSIS — I509 Heart failure, unspecified: Secondary | ICD-10-CM

## 2021-06-10 DIAGNOSIS — Z794 Long term (current) use of insulin: Secondary | ICD-10-CM

## 2021-06-10 DIAGNOSIS — E118 Type 2 diabetes mellitus with unspecified complications: Secondary | ICD-10-CM

## 2021-06-10 DIAGNOSIS — I700 Atherosclerosis of aorta: Secondary | ICD-10-CM

## 2021-06-10 DIAGNOSIS — J4540 Moderate persistent asthma, uncomplicated: Secondary | ICD-10-CM

## 2021-06-10 MED ORDER — LANTUS SOLOSTAR 100 UNIT/ML ~~LOC~~ SOPN: PEN_INJECTOR | SUBCUTANEOUS | 5 refills | Status: DC

## 2021-06-10 NOTE — Progress Notes (Signed)
   Subjective:    Patient ID: Theodore Bush, male    DOB: [birth year], 70 y.o.   MRN: [id number]  HPI Chief Complaint  Patient presents with   other    Med check no other issues pt. Wants flu and covid booster   He is here today for a medication management visit.   Other doctors caring for patient include:  Dr. Martinique -cardiology Dr Ander Slade- pulmonologist  Dr. 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   Diabetes- taking metformin 1,000 mg bid, Jardiance 25 mg once daily and Lantus 30 units nightly.   FBS in the 100-117 range    HL- taking atorvastatin 80 mg and Fenofibrate daily without any concerns. Followed by cardiology   HTN- managed by cardiologist no concerns with BP or medications.    CHF- managed by cardiologist. No concerns today with fluid retention or breathing  A-fib and on Xarelto- denies bleeding    Ascending thoracic aneurysm- 4.7 cm. He is aware and avoiding heavy lifting. Denies ever having AAA screening. History included significant years of daily marijuana smoking. States now he does not smoke it, only uses edibles.    States he was diagnosed with sleep apnea 25 years ago in [place].  States he never got a CPAP because he moved and did not follow up on it. States he has issues sleeping. States he has a history of snoring and apnea witnessed by his wife. He is under the care of pulmonology for asthma. Denies any recent flares.    Denies fever, chills, dizziness, chest pain, palpitations, shortness of breath, abdominal pain, N/V/D, urinary symptoms, LE edema.     Review of Systems Pertinent positives and negatives in the history of present illness.     Objective:   Physical Exam BP 130/84   Pulse 65   Temp 98.5 F (36.9 C)   Wt 200 lb 3.2 oz (90.8 kg)   BMI 29.56 kg/m   Alert and oriented and in no acute distress. Respirations unlabored. Normal mood.       Assessment & Plan:  Controlled type 2  diabetes mellitus with complication, with long-term current use of insulin (HCC) - Plan: CBC with Differential/Platelet, Comprehensive metabolic panel, Hemoglobin A1c, TSH, T4, free -reports good compliance with medication and no significantly elevated BS readings. Follow up pending results.   Chronic anticoagulation -no concerns   OSA (obstructive sleep apnea) -he is not using a CPAP. Discussed potential worsening health related to untreated OSA. Encouraged him to try CPAP  Chronic kidney disease, unspecified CKD stage - Plan: Comprehensive metabolic panel -continue to monitor and recommend keeping BP and BS under good control.   Chronic congestive heart failure, unspecified heart failure type (HCC) -euvolemic today, asymptomatic   Aortic arch atherosclerosis (HCC) - Plan: Lipid panel -continue statin   Mixed dyslipidemia - Plan: Lipid panel -continue statin. Follow up pending results.   Moderate persistent asthma, unspecified whether complicated -doing well, no flare ups  Needs flu shot - Plan: Flu Vaccine QUAD High Dose(Fluad)

## 2021-06-11 LAB — COMPREHENSIVE METABOLIC PANEL
ALT: 18 IU/L (ref 0–44)
AST: 26 IU/L (ref 0–40)
Albumin/Globulin Ratio: 1.8 (ref 1.2–2.2)
Albumin: 4.4 g/dL (ref 3.8–4.8)
Alkaline Phosphatase: 33 IU/L — ABNORMAL LOW (ref 44–121)
BUN/Creatinine Ratio: 19 (ref 10–24)
BUN: 23 mg/dL (ref 8–27)
Bilirubin Total: 0.6 mg/dL (ref 0.0–1.2)
CO2: 26 mmol/L (ref 20–29)
Calcium: 9.6 mg/dL (ref 8.6–10.2)
Chloride: 101 mmol/L (ref 96–106)
Creatinine, Ser: 1.22 mg/dL (ref 0.76–1.27)
Globulin, Total: 2.5 g/dL (ref 1.5–4.5)
Glucose: 75 mg/dL (ref 65–99)
Potassium: 4.7 mmol/L (ref 3.5–5.2)
Sodium: 145 mmol/L — ABNORMAL HIGH (ref 134–144)
Total Protein: 6.9 g/dL (ref 6.0–8.5)
eGFR: 64 mL/min/{1.73_m2} (ref >59)

## 2021-06-11 LAB — LIPID PANEL
Chol/HDL Ratio: 2.4 ratio (ref 0.0–5.0)
Cholesterol, Total: 121 mg/dL (ref 100–199)
HDL: 51 mg/dL (ref >39)
LDL Chol Calc (NIH): 56 mg/dL (ref 0–99)
Triglycerides: 66 mg/dL (ref 0–149)
VLDL Cholesterol Cal: 14 mg/dL (ref 5–40)

## 2021-06-11 LAB — TSH: TSH: 1.08 u[IU]/mL (ref 0.450–4.500)

## 2021-06-11 LAB — CBC WITH DIFFERENTIAL/PLATELET
Basophils Absolute: 0 10*3/uL (ref 0.0–0.2)
Basos: 0 %
EOS (ABSOLUTE): 0.3 10*3/uL (ref 0.0–0.4)
Eos: 6 %
Hematocrit: 45.7 % (ref 37.5–51.0)
Hemoglobin: 14.9 g/dL (ref 13.0–17.7)
Immature Grans (Abs): 0 10*3/uL (ref 0.0–0.1)
Immature Granulocytes: 0 %
Lymphocytes Absolute: 2.1 10*3/uL (ref 0.7–3.1)
Lymphs: 41 %
MCH: 30.5 pg (ref 26.6–33.0)
MCHC: 32.6 g/dL (ref 31.5–35.7)
MCV: 94 fL (ref 79–97)
Monocytes Absolute: 0.4 10*3/uL (ref 0.1–0.9)
Monocytes: 9 %
Neutrophils Absolute: 2.2 10*3/uL (ref 1.4–7.0)
Neutrophils: 44 %
Platelets: 196 10*3/uL (ref 150–450)
RBC: 4.88 x10E6/uL (ref 4.14–5.80)
RDW: 13.3 % (ref 11.6–15.4)
WBC: 5 10*3/uL (ref 3.4–10.8)

## 2021-06-11 LAB — T4, FREE: Free T4: 1.29 ng/dL (ref 0.82–1.77)

## 2021-06-11 LAB — HEMOGLOBIN A1C
Est. average glucose Bld gHb Est-mCnc: 200 mg/dL
Hgb A1c MFr Bld: 8.6 % — ABNORMAL HIGH (ref 4.8–5.6)

## 2021-06-12 ENCOUNTER — Other Ambulatory Visit: Payer: Self-pay | Admitting: Family Medicine

## 2021-06-12 DIAGNOSIS — E1165 Type 2 diabetes mellitus with hyperglycemia: Secondary | ICD-10-CM

## 2021-06-16 ENCOUNTER — Other Ambulatory Visit: Payer: Self-pay | Admitting: Family Medicine

## 2021-06-26 ENCOUNTER — Other Ambulatory Visit: Payer: Self-pay | Admitting: Family Medicine

## 2021-06-30 ENCOUNTER — Other Ambulatory Visit: Payer: Self-pay | Admitting: Cardiology

## 2021-06-30 ENCOUNTER — Other Ambulatory Visit: Payer: Self-pay | Admitting: Family Medicine

## 2021-06-30 DIAGNOSIS — I4819 Other persistent atrial fibrillation: Secondary | ICD-10-CM

## 2021-06-30 DIAGNOSIS — Z7901 Long term (current) use of anticoagulants: Secondary | ICD-10-CM

## 2021-06-30 NOTE — Telephone Encounter (Signed)
Prescription refill request for Xarelto received.  Indication:Afib Last office visit:11/21 Weight:90.8 kg Age:70 Scr:1.5 CrCl:59.69 ml/min  Prescription refilled

## 2021-07-12 ENCOUNTER — Other Ambulatory Visit: Payer: Self-pay | Admitting: Medical

## 2021-08-12 ENCOUNTER — Other Ambulatory Visit: Payer: Self-pay | Admitting: Pulmonary Disease

## 2021-08-15 ENCOUNTER — Telehealth: Payer: Self-pay

## 2021-08-15 MED ORDER — ALBUTEROL SULFATE HFA 108 (90 BASE) MCG/ACT IN AERS: INHALATION_SPRAY | RESPIRATORY_TRACT | 0 refills | Status: DC

## 2021-08-15 NOTE — Telephone Encounter (Signed)
Refill request from [facility] for albuterol HFA

## 2021-08-15 NOTE — Telephone Encounter (Signed)
done

## 2021-08-16 ENCOUNTER — Telehealth: Payer: Self-pay | Admitting: Cardiology

## 2021-08-16 ENCOUNTER — Telehealth: Payer: Self-pay | Admitting: Pulmonary Disease

## 2021-08-16 MED ORDER — MONTELUKAST SODIUM 10 MG PO TABS: ORAL_TABLET | ORAL | 0 refills | Status: DC

## 2021-08-16 NOTE — Telephone Encounter (Signed)
Call returned to patient, confirmed DOB. Made aware he needs to come in for a F/U appt. Appt made for January 6th at 11am. Refill sent. Patient aware no more refills will be sent until OV is made. Voiced understanding.   Nothing further needed at this time.

## 2021-08-16 NOTE — Telephone Encounter (Signed)
*  STAT* If patient is at the pharmacy, call can be transferred to refill team.   1. Which medications need to be refilled? (please list name of each medication and dose if known)  carvedilol (COREG) 25 MG tablet  2. Which pharmacy/location (including street and city if local pharmacy) is medication to be sent to? [id number] - [place], [place] [address] AT [place]  3. Do they need a 30 day or 90 day supply? 90 day supply

## 2021-08-17 ENCOUNTER — Other Ambulatory Visit: Payer: Self-pay | Admitting: Cardiology

## 2021-08-17 MED ORDER — CARVEDILOL 25 MG PO TABS: ORAL_TABLET | ORAL | 0 refills | Status: DC

## 2021-08-17 NOTE — Telephone Encounter (Signed)
Pt's medication was sent to pt's pharmacy as requested. Confirmation received.  °

## 2021-08-18 ENCOUNTER — Telehealth: Payer: Self-pay | Admitting: Pulmonary Disease

## 2021-08-19 ENCOUNTER — Other Ambulatory Visit: Payer: Self-pay | Admitting: Cardiology

## 2021-08-19 NOTE — Telephone Encounter (Signed)
Pt aware, nothing further needed.

## 2021-08-19 NOTE — Telephone Encounter (Signed)
Called and spoke to [facility], Pharmacy states they have the RX for montelukast. Will relay message to the patient.

## 2021-08-20 ENCOUNTER — Other Ambulatory Visit: Payer: Self-pay | Admitting: Pulmonary Disease

## 2021-08-26 ENCOUNTER — Other Ambulatory Visit: Payer: Self-pay

## 2021-08-26 ENCOUNTER — Ambulatory Visit (INDEPENDENT_AMBULATORY_CARE_PROVIDER_SITE_OTHER): Payer: Medicare Other | Admitting: Endocrinology

## 2021-08-26 VITALS — BP 138/88 | HR 64 | Ht 69.0 in | Wt 205.2 lb

## 2021-08-26 DIAGNOSIS — E118 Type 2 diabetes mellitus with unspecified complications: Secondary | ICD-10-CM

## 2021-08-26 DIAGNOSIS — Z794 Long term (current) use of insulin: Secondary | ICD-10-CM

## 2021-08-26 LAB — POCT GLYCOSYLATED HEMOGLOBIN (HGB A1C): Hemoglobin A1C: 7.1 % — AB (ref 4.0–5.6)

## 2021-08-26 MED ORDER — METFORMIN HCL ER 500 MG PO TB24: 2000.0000 mg | ORAL_TABLET | Freq: Every day | ORAL | 3 refills | Status: DC

## 2021-08-26 MED ORDER — LANTUS SOLOSTAR 100 UNIT/ML ~~LOC~~ SOPN: 20.0000 [IU] | PEN_INJECTOR | SUBCUTANEOUS | 5 refills | Status: DC

## 2021-08-26 MED ORDER — TIRZEPATIDE 2.5 MG/0.5ML ~~LOC~~ SOAJ: 2.5000 mg | SUBCUTANEOUS | 3 refills | Status: DC

## 2021-08-26 NOTE — Patient Instructions (Addendum)
good diet and exercise significantly improve the control of your diabetes.  please let me know if you wish to be referred to a dietician.  high blood sugar is very risky to your health.  you should see an eye doctor and dentist every year.  It is very important to get all recommended vaccinations.  Controlling your blood pressure and cholesterol drastically reduces the damage diabetes does to your body.  Those who smoke should quit.  Please discuss these with your doctor.  check your blood sugar twice a day.  vary the time of day when you check, between before the 3 meals, and at bedtime.  also check if you have symptoms of your blood sugar being too high or too low.  please keep a record of the readings and bring it to your next appointment here (or you can bring the meter itself).  You can write it on any piece of paper.  please call us sooner if your blood sugar goes below 70, or if most of your readings are over 200. I have sent prescriptions to your pharmacy: to start [facility], to reduce the Lantus to 20 units per day, and to change the metformin to extended-release. Please continue the same Jardiance.  Please come back for a follow-up appointment in 6 weeks.

## 2021-08-26 NOTE — Progress Notes (Signed)
Subjective:    Patient ID: Theodore Bush, male    DOB: January 10, 1951, 70 y.o.   MRN: [id number]  HPI pt is referred by Harland Dingwall, PA, for diabetes.  Pt states DM was dx'ed in 9211; it is complicated by DR, PAD, CAD, and CRI; he has been on insulin since 2007; pt says his diet and exercise are good; he has never had pancreatitis, pancreatic surgery, severe hypoglycemia or DKA.  He takes Lantus, 35/d, and 2 oral meds. No recent steroids. Pt says cbg varies from 64-185.  He declines continuous glucose monitor.   Past Medical History:  Diagnosis Date   Aortic arch atherosclerosis ([place]) 06/11/2019   Asthma    Atrial fibrillation ([place]) 09/02/2018   CHF (congestive heart failure) (HCC)    Controlled diabetes mellitus type 2 with complications ([place]) 94/17/4081   Coronary artery calcification 06/11/2019   Hyperlipidemia    Hypertension    Pneumonia 2019   Staph infection    to elbow 15-55yrs ago    Past Surgical History:  Procedure Laterality Date   PALATE / UVULA BIOPSY / EXCISION     trimmed several times    Social History   Socioeconomic History   Marital status: Married    Spouse name: Not on file   Number of children: Not on file   Years of education: Not on file   Highest education level: Not on file  Occupational History   Not on file  Tobacco Use   Smoking status: Never   Smokeless tobacco: Current    Types: Snuff  Vaping Use   Vaping Use: Never used  Substance and Sexual Activity   Alcohol use: Yes    Alcohol/week: 10.0 standard drinks    Types: 10 Shots of liquor per week    Comment: 10 drinks a week   Drug use: Yes    Types: Marijuana    Comment: occ   Sexual activity: Not on file  Other Topics Concern   Not on file  Social History Narrative   Not on file   Social Determinants of Health   Financial Resource Strain: Not on file  Food Insecurity: Not on file  Transportation Needs: Not on file  Physical Activity: Not on file  Stress: Not on file  Social  Connections: Not on file  Intimate Partner Violence: Not on file    Current Outpatient Medications on File Prior to Visit  Medication Sig Dispense Refill   Adalimumab 40 MG/0.4ML PNKT Inject 1 each into the skin. 2 times a months.     albuterol (VENTOLIN HFA) 108 (90 Base) MCG/ACT inhaler INHALE 1 PUFF INTO THE LUNGS EVERY 6 HOURS AS NEEDED FOR WHEEZING OR SHORTNESS OF BREATH 18 g 0   atorvastatin (LIPITOR) 80 MG tablet Take 1 tablet (80 mg total) by mouth daily. 90 tablet 2   carvedilol (COREG) 25 MG tablet TAKE 1 TABLET(25 MG) BY MOUTH TWICE DAILY. Please make overdue appt with Dr. Martinique before anymore refills. Thank you 1st attempt 60 tablet 0   empagliflozin (JARDIANCE) 25 MG TABS tablet Take 1 tablet (25 mg total) by mouth daily. 90 tablet 0   Fenofibrate 50 MG CAPS TAKE 1 CAPSULE(50 MG) BY MOUTH DAILY 90 capsule 0   glucose blood (ACCU-CHEK AVIVA PLUS) test strip TEST 1 TO 2 TIMES A DAY 100 strip 1   HYDROcodone-acetaminophen (NORCO) 10-325 MG tablet Take 1 tablet by mouth every 6 (six) hours as needed.     Insulin Pen Needle (PEN  NEEDLES) 32G X 4 MM MISC 1 each by Other route daily. 100 each 2   montelukast (SINGULAIR) 10 MG tablet TAKE 1 TABLET(10 MG) BY MOUTH DAILY 30 tablet 0   rivaroxaban (XARELTO) 20 MG TABS tablet TAKE 1 TABLET(20 MG) BY MOUTH DAILY WITH SUPPER 90 tablet 1   sacubitril-valsartan (ENTRESTO) 24-26 MG Take 1 tablet by mouth 2 (two) times daily. 180 tablet 3   spironolactone (ALDACTONE) 25 MG tablet TAKE 1 TABLET BY MOUTH EVERY 2 DAYS 45 tablet 0   SYMBICORT 160-4.5 MCG/ACT inhaler INHALE 2 PUFFS INTO THE LUNGS TWICE DAILY 10.2 g 1   No current facility-administered medications on file prior to visit.    Allergies  Allergen Reactions   Quinolones     Family History  Problem Relation Age of Onset   Lupus Mother    Hypertension Mother    Diabetes Father    Diabetes Paternal Grandmother    Colon cancer Neg Hx    Stomach cancer Neg Hx    Esophageal cancer  Neg Hx    Rectal cancer Neg Hx     BP 138/88 (BP Location: Right Arm, Patient Position: Sitting, Cuff Size: Normal)   Pulse 64   Ht 5\' 9"  (1.753 m)   Wt 205 lb 3.2 oz (93.1 kg)   SpO2 96%   BMI 30.30 kg/m     Review of Systems denies weight loss, N/V/HB, memory loss.      Objective:   Physical Exam Pulses: dorsalis pedis intact bilat.   MSK: no deformity of the feet CV: no leg edema Skin:  no ulcer on the feet.  normal color and temp on the feet.  Neuro: sensation is intact to touch on the feet.     Lab Results  Component Value Date   CREATININE 1.22 06/10/2021   BUN 23 06/10/2021   NA 145 (H) 06/10/2021   K 4.7 06/10/2021   CL 101 06/10/2021   CO2 26 06/10/2021   Lab Results  Component Value Date   HGBA1C 7.1 (A) 08/26/2021   I have reviewed outside records, and summarized: Pt was noted to have elevated A1c, and referred here.  CHF was well-controlled, but he had diffuse atherosclerosis     Assessment & Plan:  Insulin-requiring type 2 DM: uncontrolled.   Patient Instructions  good diet and exercise significantly improve the control of your diabetes.  please let me know if you wish to be referred to a dietician.  high blood sugar is very risky to your health.  you should see an eye doctor and dentist every year.  It is very important to get all recommended vaccinations.  Controlling your blood pressure and cholesterol drastically reduces the damage diabetes does to your body.  Those who smoke should quit.  Please discuss these with your doctor.  check your blood sugar twice a day.  vary the time of day when you check, between before the 3 meals, and at bedtime.  also check if you have symptoms of your blood sugar being too high or too low.  please keep a record of the readings and bring it to your next appointment here (or you can bring the meter itself).  You can write it on any piece of paper.  please call us sooner if your blood sugar goes below 70, or if most of  your readings are over 200. I have sent prescriptions to your pharmacy: to start [facility], to reduce the Lantus to 20 units per day, and  to change the metformin to extended-release. Please continue the same Jardiance.  Please come back for a follow-up appointment in 6 weeks.

## 2021-09-02 ENCOUNTER — Ambulatory Visit (INDEPENDENT_AMBULATORY_CARE_PROVIDER_SITE_OTHER): Payer: Medicare Other

## 2021-09-02 VITALS — BP 120/76 | HR 73 | Temp 98.4°F | Ht 69.0 in | Wt 205.0 lb

## 2021-09-02 DIAGNOSIS — Z0000 Encounter for general adult medical examination without abnormal findings: Secondary | ICD-10-CM | POA: Diagnosis not present

## 2021-09-02 NOTE — Progress Notes (Signed)
I connected with  Dov Dill today via telehealth video enabled device and verified that I am speaking with the correct person using two identifiers.   Location: Patient: home Provider: work  Persons participating in virtual visit: patient, provider  I discussed the limitations, risks, security and privacy concerns of performing an evaluation and management service by video and the availability of in person appointments. The patient expressed understanding and agreed to proceed.   Some vital signs may be absent or patient reported.     Subjective:   Theodore Bush is a 70 y.o. male who presents for Medicare Annual/Subsequent preventive examination.  Review of Systems     Cardiac Risk Factors include: advanced age (>73men, >15 women);diabetes mellitus;dyslipidemia;hypertension;male gender;smoking/ tobacco exposure     Objective:    Today's Vitals   09/02/21 1332  BP: 120/76  Pulse: 73  Temp: 98.4 F (36.9 C)  SpO2: 97%  Weight: 205 lb (93 kg)  Height: 5\' 9"  (1.753 m)   Body mass index is 30.27 kg/m.  Advanced Directives 09/02/2021 07/01/2020  Does Patient Have a Medical Advance Directive? Yes No  Type of Advance Directive Out of facility DNR (pink MOST or yellow form) -  Would patient like information on creating a medical advance directive? - No - Patient declined    Current Medications (verified) Outpatient Encounter Medications as of 09/02/2021  Medication Sig   Adalimumab 40 MG/0.4ML PNKT Inject 1 each into the skin. 2 times a months.   albuterol (VENTOLIN HFA) 108 (90 Base) MCG/ACT inhaler INHALE 1 PUFF INTO THE LUNGS EVERY 6 HOURS AS NEEDED FOR WHEEZING OR SHORTNESS OF BREATH   atorvastatin (LIPITOR) 80 MG tablet Take 1 tablet (80 mg total) by mouth daily.   carvedilol (COREG) 25 MG tablet TAKE 1 TABLET(25 MG) BY MOUTH TWICE DAILY. Please make overdue appt with Dr. Martinique before anymore refills. Thank you 1st attempt   empagliflozin (JARDIANCE) 25 MG TABS  tablet Take 1 tablet (25 mg total) by mouth daily.   Fenofibrate 50 MG CAPS TAKE 1 CAPSULE(50 MG) BY MOUTH DAILY   glucose blood (ACCU-CHEK AVIVA PLUS) test strip TEST 1 TO 2 TIMES A DAY   HYDROcodone-acetaminophen (NORCO) 10-325 MG tablet Take 1 tablet by mouth every 6 (six) hours as needed.   insulin glargine (LANTUS SOLOSTAR) 100 UNIT/ML Solostar Pen Inject 20 Units into the skin every morning.   Insulin Pen Needle (PEN NEEDLES) 32G X 4 MM MISC 1 each by Other route daily.   metFORMIN (GLUCOPHAGE-XR) 500 MG 24 hr tablet Take 4 tablets (2,000 mg total) by mouth daily with breakfast.   montelukast (SINGULAIR) 10 MG tablet TAKE 1 TABLET(10 MG) BY MOUTH DAILY   rivaroxaban (XARELTO) 20 MG TABS tablet TAKE 1 TABLET(20 MG) BY MOUTH DAILY WITH SUPPER   sacubitril-valsartan (ENTRESTO) 24-26 MG Take 1 tablet by mouth 2 (two) times daily.   spironolactone (ALDACTONE) 25 MG tablet TAKE 1 TABLET BY MOUTH EVERY 2 DAYS   SYMBICORT 160-4.5 MCG/ACT inhaler INHALE 2 PUFFS INTO THE LUNGS TWICE DAILY   tirzepatide (MOUNJARO) 2.5 MG/0.5ML Pen Inject 2.5 mg into the skin once a week. (Patient not taking: Reported on 09/02/2021)   No facility-administered encounter medications on file as of 09/02/2021.    Allergies (verified) Quinolones   History: Past Medical History:  Diagnosis Date   Aortic arch atherosclerosis ([place]) 06/11/2019   Asthma    Atrial fibrillation ([place]) 09/02/2018   CHF (congestive heart failure) ([facility])    Controlled diabetes mellitus type  2 with complications ([place]) [birth year]1962   Coronary artery calcification 06/11/2019   Hyperlipidemia    Hypertension    Pneumonia 2019   Staph infection    to elbow 15-48yrs ago   Past Surgical History:  Procedure Laterality Date   PALATE / UVULA BIOPSY / EXCISION     trimmed several times   Family History  Problem Relation Age of Onset   Lupus Mother    Hypertension Mother    Diabetes Father    Diabetes Paternal Grandmother    Colon cancer Neg  Hx    Stomach cancer Neg Hx    Esophageal cancer Neg Hx    Rectal cancer Neg Hx    Social History   Socioeconomic History   Marital status: Married    Spouse name: Not on file   Number of children: Not on file   Years of education: Not on file   Highest education level: Not on file  Occupational History   Not on file  Tobacco Use   Smoking status: Never   Smokeless tobacco: Current    Types: Snuff  Vaping Use   Vaping Use: Never used  Substance and Sexual Activity   Alcohol use: Yes    Alcohol/week: 10.0 standard drinks    Types: 10 Shots of liquor per week    Comment: 10 drinks a week   Drug use: Yes    Types: Marijuana, Hydrocodone    Comment: occ   Sexual activity: Not on file  Other Topics Concern   Not on file  Social History Narrative   Not on file   Social Determinants of Health   Financial Resource Strain: Low Risk    Difficulty of Paying Living Expenses: Not hard at all  Food Insecurity: No Food Insecurity   Worried About Charity fundraiser in the Last Year: Never true   Ran Out of Food in the Last Year: Never true  Transportation Needs: No Transportation Needs   Lack of Transportation (Medical): No   Lack of Transportation (Non-Medical): No  Physical Activity: Inactive   Days of Exercise per Week: 0 days   Minutes of Exercise per Session: 0 min  Stress: No Stress Concern Present   Feeling of Stress : Not at all  Social Connections: Not on file    Tobacco Counseling Ready to quit: Not Answered Counseling given: Not Answered   Clinical Intake:  Pre-visit preparation completed: Yes  Pain : No/denies pain     Nutritional Status: BMI > 30  Obese Nutritional Risks: None Diabetes: Yes  How often do you need to have someone help you when you read instructions, pamphlets, or other written materials from your doctor or pharmacy?: 1 - Never What is the last grade level you completed in school?: trade school  Diabetic? Yes Nutrition Risk  Assessment:  Has the patient had any N/V/D within the last 2 months?  No  Does the patient have any non-healing wounds?  No  Has the patient had any unintentional weight loss or weight gain?  No   Diabetes:  Is the patient diabetic?  Yes  If diabetic, was a CBG obtained today?  No  Did the patient bring in their glucometer from home?  No  How often do you monitor your CBG's? daily.   Financial Strains and Diabetes Management:  Are you having any financial strains with the device, your supplies or your medication? No .  Does the patient want to be seen by Chronic Care Management  for management of their diabetes?  No  Would the patient like to be referred to a Nutritionist or for Diabetic Management?  No   Diabetic Exams:  Diabetic Eye Exam: Overdue for diabetic eye exam. Pt has been advised about the importance in completing this exam. Patient advised to call and schedule an eye exam. Diabetic Foot Exam: Overdue, Pt has been advised about the importance in completing this exam. Pt is scheduled for diabetic foot exam on next appointment.   Interpreter Needed?: No  Information entered by :: NAllen LPN   Activities of Daily Living In your present state of health, do you have any difficulty performing the following activities: 09/02/2021 09/01/2021  Hearing? Tempie Donning  Vision? N N  Difficulty concentrating or making decisions? N N  Walking or climbing stairs? N N  Dressing or bathing? N N  Doing errands, shopping? N N  Preparing Food and eating ? N N  Using the Toilet? N N  In the past six months, have you accidently leaked urine? N N  Do you have problems with loss of bowel control? N N  Managing your Medications? N N  Managing your Finances? N N  Housekeeping or managing your Housekeeping? N N  Some recent data might be hidden    Patient Care Team: Davy Pique as PCP - General (Family Medicine)  Indicate any recent Medical Services you may have received from other  than [facility] providers in the past year (date may be approximate).     Assessment:   This is a routine wellness examination for Theodore Bush.  Hearing/Vision screen Vision Screening - Comments:: Regular eye exams,   Dietary issues and exercise activities discussed: Current Exercise Habits: The patient has a physically strenuous job, but has no regular exercise apart from work.   Goals Addressed             This Visit's Progress    Patient Stated       09/02/2021, wants to see grand children grow up       Depression Screen PHQ 2/9 Scores 09/02/2021 06/10/2021 05/02/2021 02/11/2020 06/11/2019 03/07/2018  PHQ - 2 Score 0 0 0 0 3 0  PHQ- 9 Score - - - - 15 -    Fall Risk Fall Risk  09/02/2021 09/01/2021 06/10/2021 05/02/2021 02/11/2020  Falls in the past year? 0 0 0 0 0  Number falls in past yr: - 0 0 0 -  Injury with Fall? - 0 0 0 -  Risk for fall due to : Medication side effect - No Fall Risks No Fall Risks -  Follow up Falls evaluation completed;Education provided;Falls prevention discussed - Falls evaluation completed Falls evaluation completed -    FALL RISK PREVENTION PERTAINING TO THE HOME:  Any stairs in or around the home? Yes  If so, are there any without handrails? No  Home free of loose throw rugs in walkways, pet beds, electrical cords, etc? Yes  Adequate lighting in your home to reduce risk of falls? Yes   ASSISTIVE DEVICES UTILIZED TO PREVENT FALLS:  Life alert? No  Use of a cane, walker or w/c? No  Grab bars in the bathroom? Yes  Shower chair or bench in shower? No  Elevated toilet seat or a handicapped toilet? No   TIMED UP AND GO:  Was the test performed? No .      Cognitive Function: MMSE - Mini Mental State Exam 03/07/2018  Orientation to time 5  Orientation to Place  5  Registration 3  Attention/ Calculation 5  Recall 3  Language- name 2 objects 2  Language- repeat 1  Language- follow 3 step command 3  Language- read & follow direction 1  Write a  sentence 1  Copy design 1  Total score 30     6CIT Screen 09/02/2021  What Year? 0 points  What month? 0 points  What time? 0 points  Count back from 20 0 points  Months in reverse 0 points  Repeat phrase 8 points  Total Score 8    Immunizations Immunization History  Administered Date(s) Administered   Fluad Quad(high Dose 65+) 06/11/2019, 06/10/2021   Moderna Sars-Covid-2 Vaccination 12/15/2019, 01/15/2020, 08/03/2020    TDAP status: Up to date  Flu Vaccine status: Up to date  Pneumococcal vaccine status: Up to date  Covid-19 vaccine status: Completed vaccines  Qualifies for Shingles Vaccine? Yes   Zostavax completed Yes   Shingrix Completed?: No.    Education has been provided regarding the importance of this vaccine. Patient has been advised to call insurance company to determine out of pocket expense if they have not yet received this vaccine. Advised may also receive vaccine at local pharmacy or Health Dept. Verbalized acceptance and understanding.  Screening Tests Health Maintenance  Topic Date Due   Zoster Vaccines- Shingrix (1 of 2) Never done   COVID-19 Vaccine (4 - Booster for Moderna series) 09/28/2020   COLONOSCOPY (Pts 45-47yrs Insurance coverage will need to be confirmed)  03/26/2021   FOOT EXAM  05/13/2021   OPHTHALMOLOGY EXAM  07/28/2021   Pneumonia Vaccine 9+ Years old (1 - PCV) 09/02/2022 (Originally 03/23/1957)   TETANUS/TDAP  09/02/2022 (Originally 03/24/1970)   HEMOGLOBIN A1C  02/24/2022   INFLUENZA VACCINE  Completed   Hepatitis C Screening  Completed   HPV VACCINES  Aged Out    Health Maintenance  Health Maintenance Due  Topic Date Due   Zoster Vaccines- Shingrix (1 of 2) Never done   COVID-19 Vaccine (4 - Booster for Moderna series) 09/28/2020   COLONOSCOPY (Pts 45-68yrs Insurance coverage will need to be confirmed)  03/26/2021   FOOT EXAM  05/13/2021   OPHTHALMOLOGY EXAM  07/28/2021    Colorectal cancer screening: scheduled for  colonoscopy soon  Lung Cancer Screening: (Low Dose CT Chest recommended if Age 67-80 years, 30 pack-year currently smoking OR have quit w/in 15years.) does not qualify.   Lung Cancer Screening Referral: no  Additional Screening:  Hepatitis C Screening: does qualify; Completed 02/11/2020  Vision Screening: Recommended annual ophthalmology exams for early detection of glaucoma and other disorders of the eye. Is the patient up to date with their annual eye exam?  Yes  Who is the provider or what is the name of the office in which the patient attends annual eye exams? Can't remember name If pt is not established with a provider, would they like to be referred to a provider to establish care? No .   Dental Screening: Recommended annual dental exams for proper oral hygiene  Community Resource Referral / Chronic Care Management: CRR required this visit?  No   CCM required this visit?  No      Plan:     I have personally reviewed and noted the following in the patients chart:   Medical and social history Use of alcohol, tobacco or illicit drugs  Current medications and supplements including opioid prescriptions. Patient is currently taking opioid prescriptions. Information provided to patient regarding non-opioid alternatives. Patient advised to discuss  non-opioid treatment plan with their provider. Functional ability and status Nutritional status Physical activity Advanced directives List of other physicians Hospitalizations, surgeries, and ER visits in previous 12 months Vitals Screenings to include cognitive, depression, and falls Referrals and appointments  In addition, I have reviewed and discussed with patient certain preventive protocols, quality metrics, and best practice recommendations. A written personalized care plan for preventive services as well as general preventive health recommendations were provided to patient.     Kellie Simmering, LPN   [id number]   Nurse  Notes: none

## 2021-09-02 NOTE — Patient Instructions (Signed)
Theodore Bush , Thank you for taking time to come for your Medicare Wellness Visit. I appreciate your ongoing commitment to your health goals. Please review the following plan we discussed and let me know if I can assist you in the future.   Screening recommendations/referrals: Colonoscopy: scheduled for January Recommended yearly ophthalmology/optometry visit for glaucoma screening and checkup Recommended yearly dental visit for hygiene and checkup  Vaccinations: Influenza vaccine: completed 06/10/2021 Pneumococcal vaccine: states had Tdap vaccine: states had Shingles vaccine: discussed    Covid-19: 08/03/2020, 01/15/2020, 12/15/2019  Advanced directives: copy in chart  Conditions/risks identified: tobacco use  Next appointment: Follow up in one year for your annual wellness visit.   Preventive Care 66 Years and Older, Male Preventive care refers to lifestyle choices and visits with your health care provider that can promote health and wellness. What does preventive care include? A yearly physical exam. This is also called an annual well check. Dental exams once or twice a year. Routine eye exams. Ask your health care provider how often you should have your eyes checked. Personal lifestyle choices, including: Daily care of your teeth and gums. Regular physical activity. Eating a healthy diet. Avoiding tobacco and drug use. Limiting alcohol use. Practicing safe sex. Taking low doses of aspirin every day. Taking vitamin and mineral supplements as recommended by your health care provider. What happens during an annual well check? The services and screenings done by your health care provider during your annual well check will depend on your age, overall health, lifestyle risk factors, and family history of disease. Counseling  Your health care provider may ask you questions about your: Alcohol use. Tobacco use. Drug use. Emotional well-being. Home and relationship  well-being. Sexual activity. Eating habits. History of falls. Memory and ability to understand (cognition). Work and work Statistician. Screening  You may have the following tests or measurements: Height, weight, and BMI. Blood pressure. Lipid and cholesterol levels. These may be checked every 5 years, or more frequently if you are over 78 years old. Skin check. Lung cancer screening. You may have this screening every year starting at age 70 if you have a 30-pack-year history of smoking and currently smoke or have quit within the past 15 years. Fecal occult blood test (FOBT) of the stool. You may have this test every year starting at age 33. Flexible sigmoidoscopy or colonoscopy. You may have a sigmoidoscopy every 5 years or a colonoscopy every 10 years starting at age 102. Prostate cancer screening. Recommendations will vary depending on your family history and other risks. Hepatitis C blood test. Hepatitis B blood test. Sexually transmitted disease (STD) testing. Diabetes screening. This is done by checking your blood sugar (glucose) after you have not eaten for a while (fasting). You may have this done every 1-3 years. Abdominal aortic aneurysm (AAA) screening. You may need this if you are a current or former smoker. Osteoporosis. You may be screened starting at age 34 if you are at high risk. Talk with your health care provider about your test results, treatment options, and if necessary, the need for more tests. Vaccines  Your health care provider may recommend certain vaccines, such as: Influenza vaccine. This is recommended every year. Tetanus, diphtheria, and acellular pertussis (Tdap, Td) vaccine. You may need a Td booster every 10 years. Zoster vaccine. You may need this after age 29. Pneumococcal 13-valent conjugate (PCV13) vaccine. One dose is recommended after age 70. Pneumococcal polysaccharide (PPSV23) vaccine. One dose is recommended after age 85. Talk  to your health care  provider about which screenings and vaccines you need and how often you need them. This information is not intended to replace advice given to you by your health care provider. Make sure you discuss any questions you have with your health care provider. Document Released: 10/01/2015 Document Revised: 05/24/2016 Document Reviewed: 07/06/2015 Elsevier Interactive Patient Education  2017 [place] Prevention in the Home Falls can cause injuries. They can happen to people of all ages. There are many things you can do to make your home safe and to help prevent falls. What can I do on the outside of my home? Regularly fix the edges of walkways and driveways and fix any cracks. Remove anything that might make you trip as you walk through a door, such as a raised step or threshold. Trim any bushes or trees on the path to your home. Use bright outdoor lighting. Clear any walking paths of anything that might make someone trip, such as rocks or tools. Regularly check to see if handrails are loose or broken. Make sure that both sides of any steps have handrails. Any raised decks and porches should have guardrails on the edges. Have any leaves, snow, or ice cleared regularly. Use sand or salt on walking paths during winter. Clean up any spills in your garage right away. This includes oil or grease spills. What can I do in the bathroom? Use night lights. Install grab bars by the toilet and in the tub and shower. Do not use towel bars as grab bars. Use non-skid mats or decals in the tub or shower. If you need to sit down in the shower, use a plastic, non-slip stool. Keep the floor dry. Clean up any water that spills on the floor as soon as it happens. Remove soap buildup in the tub or shower regularly. Attach bath mats securely with double-sided non-slip rug tape. Do not have throw rugs and other things on the floor that can make you trip. What can I do in the bedroom? Use night lights. Make  sure that you have a light by your bed that is easy to reach. Do not use any sheets or blankets that are too big for your bed. They should not hang down onto the floor. Have a firm chair that has side arms. You can use this for support while you get dressed. Do not have throw rugs and other things on the floor that can make you trip. What can I do in the kitchen? Clean up any spills right away. Avoid walking on wet floors. Keep items that you use a lot in easy-to-reach places. If you need to reach something above you, use a strong step stool that has a grab bar. Keep electrical cords out of the way. Do not use floor polish or wax that makes floors slippery. If you must use wax, use non-skid floor wax. Do not have throw rugs and other things on the floor that can make you trip. What can I do with my stairs? Do not leave any items on the stairs. Make sure that there are handrails on both sides of the stairs and use them. Fix handrails that are broken or loose. Make sure that handrails are as long as the stairways. Check any carpeting to make sure that it is firmly attached to the stairs. Fix any carpet that is loose or worn. Avoid having throw rugs at the top or bottom of the stairs. If you do have throw rugs,  attach them to the floor with carpet tape. Make sure that you have a light switch at the top of the stairs and the bottom of the stairs. If you do not have them, ask someone to add them for you. What else can I do to help prevent falls? Wear shoes that: Do not have high heels. Have rubber bottoms. Are comfortable and fit you well. Are closed at the toe. Do not wear sandals. If you use a stepladder: Make sure that it is fully opened. Do not climb a closed stepladder. Make sure that both sides of the stepladder are locked into place. Ask someone to hold it for you, if possible. Clearly mark and make sure that you can see: Any grab bars or handrails. First and last steps. Where the  edge of each step is. Use tools that help you move around (mobility aids) if they are needed. These include: Canes. Walkers. Scooters. Crutches. Turn on the lights when you go into a dark area. Replace any light bulbs as soon as they burn out. Set up your furniture so you have a clear path. Avoid moving your furniture around. If any of your floors are uneven, fix them. If there are any pets around you, be aware of where they are. Review your medicines with your doctor. Some medicines can make you feel dizzy. This can increase your chance of falling. Ask your doctor what other things that you can do to help prevent falls. This information is not intended to replace advice given to you by your health care provider. Make sure you discuss any questions you have with your health care provider. Document Released: 07/01/2009 Document Revised: 02/10/2016 Document Reviewed: 10/09/2014 Elsevier Interactive Patient Education  2017 [employer].

## 2021-09-16 ENCOUNTER — Other Ambulatory Visit: Payer: Self-pay | Admitting: Pulmonary Disease

## 2021-09-20 ENCOUNTER — Telehealth: Payer: Self-pay

## 2021-09-20 ENCOUNTER — Other Ambulatory Visit: Payer: Self-pay | Admitting: Family Medicine

## 2021-09-20 MED ORDER — BUDESONIDE-FORMOTEROL FUMARATE 160-4.5 MCG/ACT IN AERO
2.0000 | INHALATION_SPRAY | Freq: Two times a day (BID) | RESPIRATORY_TRACT | 1 refills | Status: DC
Start: 2021-09-20 — End: 2021-09-23

## 2021-09-20 NOTE — Telephone Encounter (Signed)
Received fax for a refill on the pts. Symbicort last apt was 06/10/21.

## 2021-09-20 NOTE — Telephone Encounter (Signed)
Pt is requesting  a 90 day script of the symbicort. Please advise [facility]

## 2021-09-23 ENCOUNTER — Other Ambulatory Visit: Payer: Self-pay

## 2021-09-23 ENCOUNTER — Encounter: Payer: Self-pay | Admitting: Pulmonary Disease

## 2021-09-23 ENCOUNTER — Ambulatory Visit (INDEPENDENT_AMBULATORY_CARE_PROVIDER_SITE_OTHER): Payer: Medicare Other | Admitting: Pulmonary Disease

## 2021-09-23 VITALS — BP 120/80 | HR 65 | Temp 97.5°F | Ht 69.0 in | Wt 200.0 lb

## 2021-09-23 DIAGNOSIS — R0602 Shortness of breath: Secondary | ICD-10-CM

## 2021-09-23 MED ORDER — BUDESONIDE-FORMOTEROL FUMARATE 160-4.5 MCG/ACT IN AERO: 2.0000 | INHALATION_SPRAY | Freq: Two times a day (BID) | RESPIRATORY_TRACT | 1 refills | Status: DC

## 2021-09-23 NOTE — Progress Notes (Signed)
Theodore Bush    [id number]    1950-12-17  Primary Care Physician:Henson, Laurian Brim, PA-C  Referring Physician: Girtha Rm, PA-C No address on file  Chief complaint:   Patient with shortness of breath with activity  HPI:  Feels better with Symbicort  compliant with Symbicort use  Rarely needs rescue albuterol-maybe twice a week  History of multiple allergies  Allergies to cats, dogs, horses, pollen  Tries to avoid his allergens as tolerated  History of congestive heart failure, atrial fibrillation, rheumatoid arthritis  He is active  Non-smoker  Recently had colonoscopy, multiple polyps removed Negative for malignancy   Outpatient Encounter Medications as of 09/23/2021  Medication Sig   Adalimumab 40 MG/0.4ML PNKT Inject 1 each into the skin. 2 times a months.   albuterol (VENTOLIN HFA) 108 (90 Base) MCG/ACT inhaler INHALE 1 PUFF INTO THE LUNGS EVERY 6 HOURS AS NEEDED FOR WHEEZING OR SHORTNESS OF BREATH   atorvastatin (LIPITOR) 80 MG tablet Take 1 tablet (80 mg total) by mouth daily.   budesonide-formoterol (SYMBICORT) 160-4.5 MCG/ACT inhaler Inhale 2 puffs into the lungs 2 (two) times daily.   carvedilol (COREG) 25 MG tablet TAKE 1 TABLET(25 MG) BY MOUTH TWICE DAILY. Please make overdue appt with Dr. Martinique before anymore refills. Thank you 1st attempt   empagliflozin (JARDIANCE) 25 MG TABS tablet Take 1 tablet (25 mg total) by mouth daily.   Fenofibrate 50 MG CAPS TAKE 1 CAPSULE(50 MG) BY MOUTH DAILY   glucose blood (ACCU-CHEK AVIVA PLUS) test strip TEST 1 TO 2 TIMES A DAY   HYDROcodone-acetaminophen (NORCO) 10-325 MG tablet Take 1 tablet by mouth every 6 (six) hours as needed.   insulin glargine (LANTUS SOLOSTAR) 100 UNIT/ML Solostar Pen Inject 20 Units into the skin every morning.   Insulin Pen Needle (PEN NEEDLES) 32G X 4 MM MISC 1 each by Other route daily.   metFORMIN (GLUCOPHAGE-XR) 500 MG 24 hr tablet Take 4 tablets (2,000 mg total) by mouth  daily with breakfast.   montelukast (SINGULAIR) 10 MG tablet TAKE 1 TABLET(10 MG) BY MOUTH DAILY   rivaroxaban (XARELTO) 20 MG TABS tablet TAKE 1 TABLET(20 MG) BY MOUTH DAILY WITH SUPPER   sacubitril-valsartan (ENTRESTO) 24-26 MG Take 1 tablet by mouth 2 (two) times daily.   spironolactone (ALDACTONE) 25 MG tablet TAKE 1 TABLET BY MOUTH EVERY 2 DAYS   tirzepatide (MOUNJARO) 2.5 MG/0.5ML Pen Inject 2.5 mg into the skin once a week.   No facility-administered encounter medications on file as of 09/23/2021.    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   Asthma    Atrial fibrillation ([place]) 09/02/2018   CHF (congestive heart failure) ([place])    Controlled diabetes mellitus type 2 with complications ([place]) 27/74/1287   Coronary artery calcification 06/11/2019   Hyperlipidemia    Hypertension    Pneumonia 2019   Staph infection    to elbow 15-53yrs ago    Past Surgical History:  Procedure Laterality Date   PALATE / UVULA BIOPSY / EXCISION     trimmed several times    Family History  Problem Relation Age of Onset   Lupus Mother    Hypertension Mother    Diabetes Father    Diabetes Paternal Grandmother    Colon cancer Neg Hx  Stomach cancer Neg Hx    Esophageal cancer Neg Hx    Rectal cancer Neg Hx     Social History   Socioeconomic History   Marital status: Married    Spouse name: Not on file   Number of children: Not on file   Years of education: Not on file   Highest education level: Not on file  Occupational History   Not on file  Tobacco Use   Smoking status: Never   Smokeless tobacco: Current    Types: Snuff  Vaping Use   Vaping Use: Never used  Substance and Sexual Activity   Alcohol use: Yes    Alcohol/week: 10.0 standard drinks    Types: 10 Shots of liquor per week    Comment: 10  drinks a week   Drug use: Yes    Types: Marijuana, Hydrocodone    Comment: occ, once a week per patient.   Sexual activity: Not on file  Other Topics Concern   Not on file  Social History Narrative   Not on file   Social Determinants of Health   Financial Resource Strain: Low Risk    Difficulty of Paying Living Expenses: Not hard at all  Food Insecurity: No Food Insecurity   Worried About Running Out of Food in the Last Year: Never true   [place] in the Last Year: Never true  Transportation Needs: No Transportation Needs   Lack of Transportation (Medical): No   Lack of Transportation (Non-Medical): No  Physical Activity: Inactive   Days of Exercise per Week: 0 days   Minutes of Exercise per Session: 0 min  Stress: No Stress Concern Present   Feeling of Stress : Not at all  Social Connections: Not on file  Intimate Partner Violence: Not on file    Review of Systems  Respiratory:  Positive for shortness of breath.   All other systems reviewed and are negative.  Vitals:   09/23/21 1102  BP: 120/80  Pulse: 65  Temp: (!) 97.5 F (36.4 C)  SpO2: 94%     Physical Exam Constitutional:      Appearance: He is well-developed.  HENT:     Head: Normocephalic and atraumatic.  Eyes:     Conjunctiva/sclera: Conjunctivae normal.     Pupils: Pupils are equal, round, and reactive to light.  Neck:     Thyroid: No thyromegaly.     Trachea: No tracheal deviation.  Cardiovascular:     Rate and Rhythm: Normal rate and regular rhythm.  Pulmonary:     Effort: Pulmonary effort is normal. No respiratory distress.     Breath sounds: Normal breath sounds. No wheezing or rales.  Chest:     Chest wall: No tenderness.   Data Reviewed: CT scan of the chest reviewed with the patient Most recent echocardiogram from October 2020 shows reduced ejection fraction of 35 to 40%  Assessment:  Shortness of breath -Multifactorial -Better with being on Symbicort -Uses Singulair  regularly  History of systolic heart failure -Optimally managed at present  Wheezing/bronchospasm -Multiple allergens -Symbicort helping -Rarely needs to use albuterol maybe twice a week at most  Plan/Recommendations: Continue Symbicort  Continue albuterol as needed  Graded exercise as tolerated  I will see him a year from now  Sherrilyn Rist MD [facility] 09/23/2021, 11:12 AM  CC: Girtha Rm, PA-C

## 2021-09-23 NOTE — Patient Instructions (Signed)
We will make sure we send refills to pharmacy  I will see you a year from now  Call with significant concerns

## 2021-09-28 ENCOUNTER — Telehealth: Payer: Self-pay

## 2021-09-28 MED ORDER — SYMBICORT 160-4.5 MCG/ACT IN AERO: 2.0000 | INHALATION_SPRAY | Freq: Two times a day (BID) | RESPIRATORY_TRACT | 6 refills | Status: DC

## 2021-09-28 NOTE — Telephone Encounter (Signed)
[facility] fax from insurance generic symbicort is not covered but brand name is, as well as advair HFA, breo, an airduo. Please advise if you would like to switch as I see you filled generic Symbicort on 09/23/21

## 2021-09-28 NOTE — Telephone Encounter (Signed)
Called and spoke with pt letting him know the info from pharmacy team and he verbalized understanding. He stated to send Rx for brand name Symbicort to pharmacy for him which I have done. Nothing further needed.

## 2021-09-28 NOTE — Telephone Encounter (Signed)
Can have brand-name Symbicort  May be switched to Advair HFA as well

## 2021-09-30 ENCOUNTER — Other Ambulatory Visit: Payer: Self-pay | Admitting: Family Medicine

## 2021-09-30 NOTE — Telephone Encounter (Signed)
[facility] is requesting to fill pt albuterol. Please advise [facility]

## 2021-10-07 ENCOUNTER — Other Ambulatory Visit: Payer: Self-pay

## 2021-10-07 ENCOUNTER — Telehealth: Payer: Self-pay

## 2021-10-07 ENCOUNTER — Encounter: Payer: Self-pay | Admitting: Gastroenterology

## 2021-10-07 ENCOUNTER — Ambulatory Visit (INDEPENDENT_AMBULATORY_CARE_PROVIDER_SITE_OTHER): Payer: Medicare Other | Admitting: Endocrinology

## 2021-10-07 ENCOUNTER — Ambulatory Visit (INDEPENDENT_AMBULATORY_CARE_PROVIDER_SITE_OTHER): Payer: Medicare Other | Admitting: Gastroenterology

## 2021-10-07 VITALS — BP 140/78 | HR 87 | Ht 69.0 in | Wt 198.2 lb

## 2021-10-07 VITALS — BP 128/82 | HR 68 | Ht 69.0 in | Wt 197.5 lb

## 2021-10-07 DIAGNOSIS — I480 Paroxysmal atrial fibrillation: Secondary | ICD-10-CM | POA: Diagnosis not present

## 2021-10-07 DIAGNOSIS — Z7901 Long term (current) use of anticoagulants: Secondary | ICD-10-CM

## 2021-10-07 DIAGNOSIS — Z794 Long term (current) use of insulin: Secondary | ICD-10-CM

## 2021-10-07 DIAGNOSIS — E118 Type 2 diabetes mellitus with unspecified complications: Secondary | ICD-10-CM

## 2021-10-07 DIAGNOSIS — I509 Heart failure, unspecified: Secondary | ICD-10-CM | POA: Diagnosis not present

## 2021-10-07 DIAGNOSIS — E139 Other specified diabetes mellitus without complications: Secondary | ICD-10-CM

## 2021-10-07 DIAGNOSIS — Z86010 Personal history of colonic polyps: Secondary | ICD-10-CM | POA: Diagnosis not present

## 2021-10-07 MED ORDER — TIRZEPATIDE 5 MG/0.5ML ~~LOC~~ SOAJ: 5.0000 mg | SUBCUTANEOUS | 3 refills | Status: DC

## 2021-10-07 MED ORDER — FENOFIBRATE 50 MG PO CAPS: ORAL_CAPSULE | ORAL | 0 refills | Status: DC

## 2021-10-07 MED ORDER — CLENPIQ 10-3.5-12 MG-GM -GM/160ML PO SOLN: 1.0000 | ORAL | 0 refills | Status: DC

## 2021-10-07 MED ORDER — LANTUS SOLOSTAR 100 UNIT/ML ~~LOC~~ SOPN: 15.0000 [IU] | PEN_INJECTOR | SUBCUTANEOUS | 5 refills | Status: DC

## 2021-10-07 NOTE — Patient Instructions (Addendum)
check your blood sugar twice a day.  vary the time of day when you check, between before the 3 meals, and at bedtime.  also check if you have symptoms of your blood sugar being too high or too low.  please keep a record of the readings and bring it to your next appointment here (or you can bring the meter itself).  You can write it on any piece of paper.  please call us sooner if your blood sugar goes below 70, or if most of your readings are over 200.   I have sent 2 prescriptions to your pharmacy: to double the [facility], to reduce the Lantus to 15 units each morning.   Please continue the same Jardiance and metformin.   Please come back for a follow-up appointment in 2 months.

## 2021-10-07 NOTE — Progress Notes (Signed)
Chief Complaint:    Colon polyps, anticoagulation management   HPI:     Patient is a 71 y.o. male with a history of A. fib (on Xarelto), CHF (EF 35-40%), HTN, OSA, HLD, diabetes, CKD 3, asthma, Psoriatic Arthritis/Psoriasis ([place]), presenting to the [facility] for follow-up and discuss ongoing colon polyp surveillance.   Has had intentional 6# wt loss with recent new DM medication (Mounjaro) and dieting mods (also appetite reduced with the new medication).  He is otherwise without any GI symptoms.  No hematochezia, melena, abdominal pain.  Has appt with Dr. Martinique in Cardiology next month. Last TTE in 06/2019 with EF 35-40%.  Endoscopic History: - Colonoscopy (03/2020):9 polyps ranging 3-6 mm (path tubular adenomas:), 8 mm pedunculated rectosigmoid polyp (path: Tubular adenoma), 8 mm distal rectal polyp (path: Leiomyoma), Diverticulosis, internal hemorrhoids.  Normal TI.  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 2 times a months.     albuterol (VENTOLIN HFA) 108 (90 Base) MCG/ACT inhaler INHALE 1 PUFF INTO THE LUNGS EVERY 6 HOURS AS NEEDED FOR WHEEZING OR SHORTNESS OF BREATH 18 g 0   atorvastatin (LIPITOR) 80 MG tablet Take 1 tablet (80 mg total) by mouth daily. 90 tablet 2   carvedilol  (COREG) 25 MG tablet TAKE 1 TABLET(25 MG) BY MOUTH TWICE DAILY. Please make overdue appt with Dr. Martinique before anymore refills. Thank you 1st attempt 60 tablet 0   empagliflozin (JARDIANCE) 25 MG TABS tablet Take 1 tablet (25 mg total) by mouth daily. 90 tablet 0   Fenofibrate 50 MG CAPS TAKE 1 CAPSULE(50 MG) BY MOUTH DAILY 90 capsule 0   glucose blood (ACCU-CHEK AVIVA PLUS) test strip TEST 1 TO 2 TIMES A DAY 100 strip 1   HYDROcodone-acetaminophen (NORCO) 10-325 MG tablet Take 1 tablet by mouth every 6 (six) hours as needed.     insulin glargine (LANTUS SOLOSTAR) 100 UNIT/ML Solostar Pen Inject 20 Units into the skin every morning. 15 mL 5   Insulin Pen Needle (PEN NEEDLES) 32G X 4 MM MISC 1 each by Other route daily. 100 each 2   metFORMIN (GLUCOPHAGE-XR) 500 MG 24 hr tablet Take 4 tablets (2,000 mg total) by mouth daily with breakfast. 360 tablet 3   montelukast (SINGULAIR) 10 MG tablet TAKE 1 TABLET(10 MG) BY MOUTH DAILY 90 tablet 1   rivaroxaban (XARELTO) 20 MG TABS tablet TAKE 1 TABLET(20 MG) BY MOUTH DAILY WITH SUPPER 90 tablet 1   sacubitril-valsartan (ENTRESTO) 24-26 MG Take 1 tablet by mouth 2 (two) times daily. 180 tablet 3   spironolactone (ALDACTONE) 25 MG tablet TAKE 1 TABLET BY MOUTH EVERY 2 DAYS 45 tablet 0   SYMBICORT 160-4.5 MCG/ACT inhaler Inhale 2 puffs into the lungs in the morning  and at bedtime. 10.2 g 6   tirzepatide (MOUNJARO) 2.5 MG/0.5ML Pen Inject 2.5 mg into the skin once a week. 6 mL 3   No current facility-administered medications for this visit.    Physical Exam:     BP 128/82    Pulse 68    Ht 5\' 9"  (1.753 m)    Wt 197 lb 8 oz (89.6 kg)    SpO2 97%    BMI 29.17 kg/m   GENERAL:  Pleasant male in NAD PSYCH: : Cooperative, normal affect Musculoskeletal:  Normal muscle tone, normal strength NEURO: Alert and oriented x 3, no focal neurologic deficits   IMPRESSION and PLAN:    1) History of colon polyps - Due for repeat colonoscopy for ongoing polyp  surveillance - Schedule colonoscopy - If additional adenomatous polyps, per most recent GI societal guideline update, could consider Genetics referral  2) CHF 3) Atrial fibrillation 4) Chronic anticoagulation - We will obtain Cardiology clearance - Has follow-up with Dr. Martinique next month - Hold Xarelto 2 days days before procedure - will instruct when and how to resume after procedure. Low but real risk of cardiovascular event such as heart attack, stroke, embolism, thrombosis or ischemia/infarct of other organs off Xarelto explained and need to seek urgent help if this occurs. The patient consents to proceed. Will communicate by phone or EMR with patient's prescribing provider to confirm that holding Xarelto is reasonable in this case  5) Diabetes - Has had 6# weight loss since starting Mounjaro (decreased appetite in 5-11%).  Last A1c 8.6% in 05/2021 - Continue close follow[facility] with [facility]    The indications, risks, and benefits of colonoscopy were explained to the patient in detail. Risks include but are not limited to bleeding, perforation, adverse reaction to medications, and cardiopulmonary compromise. Sequelae include but are not limited to the possibility of surgery, hospitalization, and mortality. The patient verbalized understanding and wished to proceed. All questions answered, referred to the scheduler and bowel prep ordered. Further recommendations pending results of the exam.     Lavena Bullion ,DO, FACG 10/07/2021, 11:37 AM

## 2021-10-07 NOTE — Telephone Encounter (Signed)
Patient with diagnosis of atrial fibrillation on Xarelto for anticoagulation.    Procedure: colonoscopy Date of procedure: 11/15/21   CHA2DS2-VASc Score = 5   This indicates a 7.2% annual risk of stroke. The patient's score is based upon: CHF History: 1 HTN History: 1 Diabetes History: 1 Stroke History: 0 Vascular Disease History: 1 Age Score: 1 Gender Score: 0    CrCl 63 Platelet count 196  Per office protocol, patient can hold Xarelto for 2 days prior to procedure.   Patient will not need bridging with Lovenox (enoxaparin) around procedure.

## 2021-10-07 NOTE — Telephone Encounter (Signed)
[facility] Pre-operative Risk Assessment     Request for surgical clearance:     Endoscopy Procedure  What type of surgery is being performed?     Colonoscopy  When is this surgery scheduled?     11/15/2021  What type of clearance is required ?   Pharmacy  Are there any medications that need to be held prior to surgery and how long? Xarelto for 2 days  Practice name and name of physician performing surgery?      [facility], Dr. Bryan Lemma  What is your office phone and fax number?      Phone- [phone]  [phone]  Anesthesia type (None, local, MAC, general) ?       MAC

## 2021-10-07 NOTE — Progress Notes (Signed)
Subjective:    Patient ID: Theodore Bush, male    DOB: 24-Jan-1951, 71 y.o.   MRN: [id number]  HPI Pt returns for f/u of diabetes mellitus: DM type: Insulin-requiring type 2 Dx'ed: 7408 Complications: DR, PAD, CAD, and CRI Therapy: insulin since 2007, Mounjaro, and 2 oral meds.   DKA: never Severe hypoglycemia: never Pancreatitis: never Pancreatic imaging: no result known SDOH: none Other: He declines continuous glucose monitor.  Interval history: He takes meds as rx'ed.  no cbg record, but states cbg's vary from 69-180.   Past Medical History:  Diagnosis Date   Aortic arch atherosclerosis ([place]) 06/11/2019   Asthma    Atrial fibrillation ([place]) 09/02/2018   CHF (congestive heart failure) ([facility])    Controlled diabetes mellitus type 2 with complications ([place]) 14/48/1856   Coronary artery calcification 06/11/2019   Hyperlipidemia    Hypertension    Pneumonia 2019   Staph infection    to elbow 15-69yrs ago    Past Surgical History:  Procedure Laterality Date   PALATE / UVULA BIOPSY / EXCISION     trimmed several times    Social History   Socioeconomic History   Marital status: Married    Spouse name: Not on file   Number of children: 2   Years of education: Not on file   Highest education level: Not on file  Occupational History   Occupation: Retired    Comment: Copywriter, advertising  Tobacco Use   Smoking status: Never   Smokeless tobacco: Current    Types: Snuff  Vaping Use   Vaping Use: Never used  Substance and Sexual Activity   Alcohol use: Yes    Alcohol/week: 10.0 standard drinks    Types: 10 Shots of liquor per week    Comment: 10 drinks a week   Drug use: Yes    Types: Marijuana, Hydrocodone    Comment: occ, once a week per patient.   Sexual activity: Not on file  Other Topics Concern   Not on file  Social History Narrative   Not on file   Social Determinants of Health   Financial Resource Strain: Low Risk    Difficulty of Paying Living  Expenses: Not hard at all  Food Insecurity: No Food Insecurity   Worried About Running Out of Food in the Last Year: Never true   [place] in the Last Year: Never true  Transportation Needs: No Transportation Needs   Lack of Transportation (Medical): No   Lack of Transportation (Non-Medical): No  Physical Activity: Inactive   Days of Exercise per Week: 0 days   Minutes of Exercise per Session: 0 min  Stress: No Stress Concern Present   Feeling of Stress : Not at all  Social Connections: Not on file  Intimate Partner Violence: Not on file    Current Outpatient Medications on File Prior to Visit  Medication Sig Dispense Refill   Adalimumab 40 MG/0.4ML PNKT Inject 1 each into the skin. 2 times a months.     albuterol (VENTOLIN HFA) 108 (90 Base) MCG/ACT inhaler INHALE 1 PUFF INTO THE LUNGS EVERY 6 HOURS AS NEEDED FOR WHEEZING OR SHORTNESS OF BREATH 18 g 0   atorvastatin (LIPITOR) 80 MG tablet Take 1 tablet (80 mg total) by mouth daily. 90 tablet 2   carvedilol (COREG) 25 MG tablet TAKE 1 TABLET(25 MG) BY MOUTH TWICE DAILY. Please make overdue appt with Dr. Martinique before anymore refills. Thank you 1st attempt 60 tablet 0  empagliflozin (JARDIANCE) 25 MG TABS tablet Take 1 tablet (25 mg total) by mouth daily. 90 tablet 0   glucose blood (ACCU-CHEK AVIVA PLUS) test strip TEST 1 TO 2 TIMES A DAY 100 strip 1   HYDROcodone-acetaminophen (NORCO) 10-325 MG tablet Take 1 tablet by mouth every 6 (six) hours as needed.     Insulin Pen Needle (PEN NEEDLES) 32G X 4 MM MISC 1 each by Other route daily. 100 each 2   metFORMIN (GLUCOPHAGE-XR) 500 MG 24 hr tablet Take 4 tablets (2,000 mg total) by mouth daily with breakfast. 360 tablet 3   montelukast (SINGULAIR) 10 MG tablet TAKE 1 TABLET(10 MG) BY MOUTH DAILY 90 tablet 1   rivaroxaban (XARELTO) 20 MG TABS tablet TAKE 1 TABLET(20 MG) BY MOUTH DAILY WITH SUPPER 90 tablet 1   sacubitril-valsartan (ENTRESTO) 24-26 MG Take 1 tablet by mouth 2 (two)  times daily. 180 tablet 3   spironolactone (ALDACTONE) 25 MG tablet TAKE 1 TABLET BY MOUTH EVERY 2 DAYS 45 tablet 0   SYMBICORT 160-4.5 MCG/ACT inhaler Inhale 2 puffs into the lungs in the morning and at bedtime. 10.2 g 6   No current facility-administered medications on file prior to visit.    Allergies  Allergen Reactions   Quinolones Other (See Comments)    Family History  Problem Relation Age of Onset   Lupus Mother    Hypertension Mother    Diabetes Father    Diabetes Paternal Grandmother    Colon cancer Neg Hx    Stomach cancer Neg Hx    Esophageal cancer Neg Hx    Rectal cancer Neg Hx     BP 140/78    Pulse 87    Ht 5\' 9"  (1.753 m)    Wt 198 lb 3.2 oz (89.9 kg)    SpO2 94%    BMI 29.27 kg/m   Review of Systems Denies N/V/HB.  He has lost a few lbs    Objective:   Physical Exam   Lab Results  Component Value Date   HGBA1C 7.1 (A) 08/26/2021      Assessment & Plan:  Insulin-requiring type 2 DM.   Hypoglycemia, due to insulin: we'll favor GLP rx.    Patient Instructions  check your blood sugar twice a day.  vary the time of day when you check, between before the 3 meals, and at bedtime.  also check if you have symptoms of your blood sugar being too high or too low.  please keep a record of the readings and bring it to your next appointment here (or you can bring the meter itself).  You can write it on any piece of paper.  please call us sooner if your blood sugar goes below 70, or if most of your readings are over 200.   I have sent 2 prescriptions to your pharmacy: to double the [facility], to reduce the Lantus to 15 units each morning.   Please continue the same Jardiance and metformin.   Please come back for a follow-up appointment in 2 months.

## 2021-10-07 NOTE — Patient Instructions (Signed)
If you are age 70 or older, your body mass index should be between 23-30. Your Body mass index is 29.17 kg/m. If this is out of the aforementioned range listed, please consider follow up with your Primary Care Provider.  If you are age 58 or younger, your body mass index should be between 19-25. Your Body mass index is 29.17 kg/m. If this is out of the aformentioned range listed, please consider follow up with your Primary Care Provider.   __________________________________________________________  The [facility] providers would like to encourage you to use [facility] to communicate with providers for non-urgent requests or questions.  Due to long hold times on the telephone, sending your provider a message by [facility] may be a faster and more efficient way to get a response.  Please allow 48 business hours for a response.  Please remember that this is for non-urgent requests.    Due to recent changes in healthcare laws, you may see the results of your imaging and laboratory studies on MyChart before your provider has had a chance to review them.  We understand that in some cases there may be results that are confusing or concerning to you. Not all laboratory results come back in the same time frame and the provider may be waiting for multiple results in order to interpret others.  Please give [place] 48 hours in order for your provider to thoroughly review all the results before contacting the office for clarification of your results.   You have been scheduled for a colonoscopy. Please follow written instructions given to you at your visit today.  Please pick up your prep supplies at the pharmacy within the next 1-3 days. If you use inhalers (even only as needed), please bring them with you on the day of your procedure.   You will be contaced by our office prior to your procedure for directions on holding your Xarelto  If you do not hear from our office 1 week prior to your scheduled procedure, please call  [phone] to discuss.   Thank you for choosing me and [facility].  Vito Cirigliano, D.O.

## 2021-10-10 ENCOUNTER — Other Ambulatory Visit: Payer: Self-pay

## 2021-10-10 MED ORDER — SPIRONOLACTONE 25 MG PO TABS: ORAL_TABLET | ORAL | 0 refills | Status: DC

## 2021-10-10 NOTE — Telephone Encounter (Signed)
° °  Primary Cardiologist: Peter Martinique, MD  Chart reviewed as part of pre-operative protocol coverage. Given past medical history and time since last visit, based on ACC/AHA guidelines, A[place] Christen would be at acceptable risk for the planned procedure without further cardiovascular testing.   Patient with diagnosis of atrial fibrillation on Xarelto for anticoagulation.     Procedure: colonoscopy Date of procedure: 11/15/21     CHA2DS2-VASc Score = 5   This indicates a 7.2% annual risk of stroke. The patient's score is based upon: CHF History: 1 HTN History: 1 Diabetes History: 1 Stroke History: 0 Vascular Disease History: 1 Age Score: 1 Gender Score: 0     CrCl 63 Platelet count 196   Per office protocol, patient can hold Xarelto for 2 days prior to procedure.   Patient will not need bridging with Lovenox (enoxaparin) around procedure.  I will route this recommendation to the requesting party via Epic fax function and remove from pre-op pool.  Please call with questions.  Jossie Ng. Aaryn Sermon NP-C    10/10/2021, 9:41 AM [place] [address] Office [phone] Fax [phone]

## 2021-10-10 NOTE — Telephone Encounter (Signed)
Patient is informed to hold Xarelto for 2 days prior to procedure. Patient voiced understanding.

## 2021-10-31 ENCOUNTER — Telehealth: Payer: Self-pay | Admitting: Physician Assistant

## 2021-10-31 ENCOUNTER — Other Ambulatory Visit: Payer: Self-pay | Admitting: Physician Assistant

## 2021-10-31 DIAGNOSIS — M5136 Other intervertebral disc degeneration, lumbar region: Secondary | ICD-10-CM | POA: Insufficient documentation

## 2021-10-31 DIAGNOSIS — L4059 Other psoriatic arthropathy: Secondary | ICD-10-CM | POA: Insufficient documentation

## 2021-10-31 DIAGNOSIS — K7030 Alcoholic cirrhosis of liver without ascites: Secondary | ICD-10-CM | POA: Insufficient documentation

## 2021-10-31 DIAGNOSIS — N1831 Chronic kidney disease, stage 3a: Secondary | ICD-10-CM | POA: Insufficient documentation

## 2021-10-31 DIAGNOSIS — Z6830 Body mass index (BMI) 30.0-30.9, adult: Secondary | ICD-10-CM | POA: Insufficient documentation

## 2021-10-31 DIAGNOSIS — Z794 Long term (current) use of insulin: Secondary | ICD-10-CM

## 2021-10-31 MED ORDER — ACCU-CHEK AVIVA PLUS VI STRP: ORAL_STRIP | 3 refills | Status: DC

## 2021-10-31 NOTE — Telephone Encounter (Signed)
Received Fax Refill Request from [facility] Strips 100S Quantity: 100

## 2021-10-31 NOTE — Telephone Encounter (Signed)
Refill done.  Thanks

## 2021-11-01 ENCOUNTER — Encounter: Payer: Self-pay | Admitting: Gastroenterology

## 2021-11-10 ENCOUNTER — Telehealth: Payer: Self-pay

## 2021-11-10 DIAGNOSIS — I7121 Aneurysm of the ascending aorta, without rupture: Secondary | ICD-10-CM

## 2021-11-10 NOTE — Telephone Encounter (Signed)
Cardiac Clearance sent to Dr. Martinique.

## 2021-11-10 NOTE — Telephone Encounter (Signed)
-----   Message from Letta Pate, [place] sent at 11/10/2021 10:41 AM EST ----- Regarding: ASAP cardiac clearance  ----- Message ----- From: Lavena Bullion, DO Sent: 11/10/2021   9:41 AM EST To: Osvaldo Angst, CRNA, Letta Pate, [place] like we had Cardiology clearance on 10/07/2021, although moreso for Afib and anticoagulation management.   Olivia Mackie, Can you please assist. Please reach back out to Cardiology to obtain clearance to proceed with regards to his dilated aorta. Thank you!  ----- Message ----- From: Osvaldo Angst, CRNA Sent: 11/10/2021   9:29 AM EST To: Lavena Bullion, DO  Dr. Bryan Lemma,  This pt is scheduled with you on 11/15/21.  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  We will need a cardiac clearance in order to clear him for care at [facility].  Thanks,  Osvaldo Angst

## 2021-11-11 NOTE — Telephone Encounter (Signed)
Contacted Ronalee Belts and explained that he will not be cleared by cardiology for his procedure on 11/15/2021, we will cancel for now. Cardiology is requesting that he come to the clinic for an office visit since he has not been there in 2 years. The patient verbalized understanding and understood we were cancelling his procedure.

## 2021-11-11 NOTE — Telephone Encounter (Signed)
Pt will require an appt with cardiology before being cleared. I will update the requesting office. Procedure will need to be postponed until cleared by cardiology.

## 2021-11-11 NOTE — Telephone Encounter (Signed)
Resent fax for cardiac clearance to PREOP team for colonoscopy procedure which is scheduled on 11/15/21. Earlier it was sent for endoscopy by mistake. Confirmed with Julaine Hua that fax is received by office.

## 2021-11-11 NOTE — Telephone Encounter (Signed)
GI OFFICE JUST UPDATED THE PROCEDURE IS NOT FOR UPPER ENDOSCOPY AS STATED IN CLEARANCE REQUEST; CORRECT PROCEDURE IS FOR: COLONOSCOPY

## 2021-11-11 NOTE — Telephone Encounter (Signed)
Please see the attached.  Patient needs appointment in the Cardiology clinic prior to clearance.  Please postpone colonoscopy that is scheduled for next week until his Cardiologist has a chance to see him in the office.  Please update patient.  Thank you.

## 2021-11-11 NOTE — Telephone Encounter (Signed)
° °  Pre-operative Risk Assessment    Patient Name: Theodore Bush  DOB: 1951-03-26 MRN: [id number]      Request for Surgical Clearance    Procedure:   UPPER ENDOSCOPY  Date of Surgery:  Clearance 11/15/21                                 Surgeon:  Lavena Bullion, DO Surgeon's Group or Practice Name: [facility] Phone number:  [phone] Fax number:  [phone]   Type of Clearance Requested:   - Medical ; SEE NOTES THAT STATING THEY HAD CLEARANCE 09/2021 FOR ELIQUIS   Type of Anesthesia:   PROPOFOL   Additional requests/questions:    [name]   11/11/2021, 10:42 AM

## 2021-11-11 NOTE — Telephone Encounter (Signed)
Our office just received another fax for the clearance; the fax at this time states: this letter is specific to cardiac clearance not Eliquis clearance. The patient is scheduled for 11/15/21, Please send back clearance requested.   I will update the pre op provider and the Pharm-d

## 2021-11-11 NOTE — Telephone Encounter (Signed)
Patient with diagnosis of atrial fibrillation on Xarelto for anticoagulation.     Procedure: colonoscopy Date of procedure: 11/15/21     CHA2DS2-VASc Score = 5   This indicates a 7.2% annual risk of stroke. The patient's score is based upon: CHF History: 1 HTN History: 1 Diabetes History: 1 Stroke History: 0 Vascular Disease History: 1 Age Score: 1 Gender Score: 0     CrCl 63 Platelet count 196   Per office protocol, patient can hold Xarelto for 2 days prior to procedure.

## 2021-11-11 NOTE — Telephone Encounter (Signed)
Primary Cardiologist:Peter Martinique, MD  Chart reviewed as part of pre-operative protocol coverage. Because of Theodore Bush's past medical history and time since last visit, he/she will require a follow-up visit in order to better assess preoperative cardiovascular risk.  Pre-op covering staff: - Please schedule appointment and call patient to inform them. - Please contact requesting surgeon's office via preferred method (i.e, phone, fax) to inform them of need for appointment prior to surgery.  If applicable, this message will also be routed to pharmacy pool and/or primary cardiologist for input on holding anticoagulant/antiplatelet agent as requested below so that this information is available at time of patient's appointment.   Theodore Pelton, NP  11/11/2021, 3:21 PM

## 2021-11-14 NOTE — Telephone Encounter (Signed)
Left message for the pt to call the office to schedule an appt for pre op clearance with Dr. Stanford Breed or APP.

## 2021-11-15 ENCOUNTER — Encounter: Payer: Medicare Other | Admitting: Gastroenterology

## 2021-11-15 ENCOUNTER — Other Ambulatory Visit: Payer: Self-pay

## 2021-11-15 NOTE — Telephone Encounter (Signed)
Prior echocardiogram 06/2019 LVEF 35-40%.  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 Patient was last seen by pharmacy team 08/2020 for heart failure medication titration. At that time he was tolerating Entresto well and medications were continued. He was to follow up with Dr. Martinique 01/2021 but this was not completed.  Now with request for colonoscopy clearance. As he is overdue for CT for monitoring, will order CT aorta to be hopefully completed prior to his clinic visit. He will need BMP 3-5 days prior to scan. Referral placed to cardiothoracic surgery. Will address HF management, any cardiac symptoms, clearance at upcoming visit.   Will route to RN to request they: Update patient with plan for BMP, CT, and referral to cardiothoracic surgery for monitoring of known ascending aortic aneurysm.  Loel Dubonnet, NP  ________________________________________  CT Angio chest aorta 06/2020 IMPRESSION: 1. 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 Recommend semi-annual imaging followup by CTA or MRA and referral to cardiothoracic surgery if not already obtained. This recommendation follows 2010 ACCF/AHA/AATS/ACR/ASA/SCA/SCAI/SIR/STS/SVM Guidelines for the Diagnosis and Management of Patients With Thoracic Aortic Disease. Circulation. 2010; 121: M578-I696. Aortic aneurysm NOS (ICD10-I71.9) 2. Liver contour is slightly nodular particularly in the anterior left hepatic lobe. Early cirrhotic changes cannot be excluded. 3. Enlarged pulmonary arteries. Findings can be associated with pulmonary hypertension. 4. Aortic Atherosclerosis (ICD10-I70.0). Coronary artery calcifications.

## 2021-11-15 NOTE — Telephone Encounter (Signed)
I s/w pt and he is agreeable to plan of care for pre op appt. Pt has not been seen by cardiology in 2 yrs. Primary card is Dr. Martinique, not Dr. Stanford Breed as listed in previous notes. Pt states he is at work right now and asked for me to call back and leave a message when I have an appt. Pt states he needs a Friday appt as this is only day off during the week. I was able to schedule the pt with Laurann Montana, NP at [facility] on 12/02/21 @ 1:55. I will leave vm for the pt with all needed information. I will send notes to NP for upcoming appt. Will send FYI to requesting office the pt has appt 12/02/21.

## 2021-11-15 NOTE — Addendum Note (Signed)
Addended by: Loel Dubonnet on: 11/15/2021 12:10 PM   Modules accepted: Orders

## 2021-11-15 NOTE — Telephone Encounter (Signed)
Called patient to review Laurann Montana, NP recommendations for preop appointment! Patient states that he was at work and can review the notes later.   Laurann Montana, NP sent mychart message with instructions. RN mailing lab slips to patient!   Patient to call us back or send mychart message if he has questions!

## 2021-11-15 NOTE — Telephone Encounter (Signed)
Left a very detailed message for the pt that I have him scheduled for 12/02/21 with Laurann Montana, NP at [facility] [place] location @ 1:55. Please call the office if any questions.

## 2021-11-21 ENCOUNTER — Other Ambulatory Visit: Payer: Self-pay | Admitting: Family Medicine

## 2021-11-21 NOTE — Telephone Encounter (Signed)
[facility] is requesting to fill pt albuterol inhaler please advise [place] ?

## 2021-11-27 ENCOUNTER — Other Ambulatory Visit: Payer: Self-pay | Admitting: Medical

## 2021-12-01 NOTE — Progress Notes (Signed)
? ? ?Office Visit  ?  ?Patient Name: Theodore Bush ?Date of Encounter: 12/01/2021 ? ?Primary Care Provider:  Girtha Rm, NP-C ?Primary Cardiologist:  Peter Martinique, MD ?Primary Electrophysiologist: None ?Chief Complaint  ?  ?Theodore Bush is a 71 y.o. male presents today for preoperative surgical clearance  ? ?History of Present Illness  ?  ?Theodore Bush is a 71 y.o. male with PMH of CHF, atrial fibrillation, DM II,HTN, asthma, CKD III,HLD.Patient is new to the area recently moving 2 years ago from [place] to [place].  He is a retired Building control surveyor. He has no prior history of MI but had chest CT positive for pulmonary nodules. Echo completed 10/20 with LV dysfunction and EF of 35-40%. Global hypokinesis and moderate aortic root enlargement.  He has not seen Dr. Martinique since 11/21. ? ?Since last being seen in our clinic  Theodore Bush reports doing well since being last seen in 11/21.  Presents today for preoperative clearance for colonoscopy for surveillance of polyps. He is currently working as a Scientist, product/process development for time.  His job is very rigorous and he completes all functions without difficulty.  He is in A-fib today with a controlled rate of 60 bpm. He denies chest pain, palpitations, PND, orthopnea, nausea, vomiting, dizziness, syncope, edema, weight gain, or early satiety.  He does experience dyspnea with exertion due to his asthma.  He has lost 60 pounds on Mounjaro.  He is scheduled to see Dr. Cyndia Bent next month and will undergo biannual CT scan for monitoring thoracic aneurysm. ? ?Past Medical History  ?  ?Past Medical History:  ?Diagnosis Date  ? Aortic arch atherosclerosis ([place]) 06/11/2019  ? Asthma   ? Atrial fibrillation ([place]) 09/02/2018  ? CHF (congestive heart failure) ([place])   ? Controlled diabetes mellitus type 2 with complications ([place]) 20/25/4270  ? Coronary artery calcification 06/11/2019  ? Hyperlipidemia   ? Hypertension   ? Pneumonia 2019  ? Staph infection   ? to elbow 15-89yr ago  ? ?Past Surgical  History:  ?Procedure Laterality Date  ? PALATE / UVULA BIOPSY / EXCISION    ? trimmed several times  ? ? ?Allergies ? ?Allergies  ?Allergen Reactions  ? Quinolones Other (See Comments)  ? ? ?Home Medications  ?  ?Current Outpatient Medications  ?Medication Sig Dispense Refill  ? Adalimumab 40 MG/0.4ML PNKT Inject 1 each into the skin. 2 times a months.    ? albuterol (VENTOLIN HFA) 108 (90 Base) MCG/ACT inhaler INHALE 1 PUFF INTO THE LUNGS EVERY 6 HOURS AS NEEDED FOR WHEEZING OR SHORTNESS OF BREATH 18 g 0  ? atorvastatin (LIPITOR) 80 MG tablet Take 1 tablet (80 mg total) by mouth daily. 90 tablet 2  ? carvedilol (COREG) 25 MG tablet TAKE 1 TABLET(25 MG) BY MOUTH TWICE DAILY. Please make overdue appt with Dr. JMartiniquebefore anymore refills. Thank you 1st attempt 60 tablet 0  ? Fenofibrate 50 MG CAPS TAKE 1 CAPSULE(50 MG) BY MOUTH DAILY 90 capsule 0  ? glucose blood (ACCU-CHEK AVIVA PLUS) test strip TEST 1 TO 2 TIMES A DAY 100 strip 3  ? HYDROcodone-acetaminophen (NORCO) 10-325 MG tablet Take 1 tablet by mouth every 6 (six) hours as needed.    ? HYDROcodone-acetaminophen (NORCO) 10-325 MG tablet 1 tablet as needed    ? insulin glargine (LANTUS SOLOSTAR) 100 UNIT/ML Solostar Pen Inject 15 Units into the skin every morning. 15 mL 5  ? Insulin Pen Needle (PEN NEEDLES) 32G X 4 MM MISC 1 each by Other route  daily. 100 each 2  ? JARDIANCE 25 MG TABS tablet TAKE 1 TABLET(25 MG) BY MOUTH DAILY 90 tablet 0  ? metFORMIN (GLUCOPHAGE-XR) 500 MG 24 hr tablet Take 4 tablets (2,000 mg total) by mouth daily with breakfast. 360 tablet 3  ? montelukast (SINGULAIR) 10 MG tablet TAKE 1 TABLET(10 MG) BY MOUTH DAILY 90 tablet 1  ? rivaroxaban (XARELTO) 20 MG TABS tablet TAKE 1 TABLET(20 MG) BY MOUTH DAILY WITH SUPPER 90 tablet 1  ? sacubitril-valsartan (ENTRESTO) 24-26 MG Take 1 tablet by mouth 2 (two) times daily. 180 tablet 3  ? Sod Picosulfate-Mag Ox-Cit Acd (CLENPIQ) 10-3.5-12 MG-GM -GM/160ML SOLN Take 1 kit by mouth as directed. 320 mL  0  ? spironolactone (ALDACTONE) 25 MG tablet TAKE 1 TABLET BY MOUTH EVERY 2 DAYS 45 tablet 0  ? SYMBICORT 160-4.5 MCG/ACT inhaler Inhale 2 puffs into the lungs in the morning and at bedtime. 10.2 g 6  ? tirzepatide (MOUNJARO) 5 MG/0.5ML Pen Inject 5 mg into the skin once a week. 6 mL 3  ? ?No current facility-administered medications for this visit.  ?  ? ?Review of Systems  ?Please see the history of present illness.    ?(+) Shortness of breath due to asthma ?(+) Tiredness due to nature of work ? ?All other systems reviewed and are otherwise negative except as noted above. ? ?Physical Exam  ?  ?Wt Readings from Last 3 Encounters:  ?10/07/21 198 lb 3.2 oz (89.9 kg)  ?10/07/21 197 lb 8 oz (89.6 kg)  ?09/23/21 200 lb (90.7 kg)  ? ?WG:YKZLD were no vitals filed for this visit.,There is no height or weight on file to calculate BMI. ? ?GEN: Well nourished, well developed, in no acute distress. ?Neck: Supple, no JVD, carotid bruits, or masses. ?Cardiac: S1,S2,IRIR no murmurs, rubs, or gallops. No clubbing, cyanosis, edema.  ?Radials/PT 2+ and equal bilaterally.  ?Respiratory:  Respirations regular and unlabored, clear to auscultation and diminished in bases bilaterally. ?MS: no deformity or atrophy. ?Skin: warm and dry, no rash. ?Neuro:  Strength and sensation are intact. ?Psych: Normal affect. ? ?EKG/LABS/Other Studies Reviewed  ?  ?ECG personally reviewed by me today -atrial fibrillation with rate of 61- no acute changes. ? ?Risk Assessment/Calculations:   ? ?CHA2DS2-VASc Score = 5  ? This indicates a 7.2% annual risk of stroke. ?The patient's score is based upon: ?CHF History: 1 ?HTN History: 1 ?Diabetes History: 1 ?Stroke History: 0 ?Vascular Disease History: 1 ?Age Score: 1 ?Gender Score: 0 ?  ? ? ?Lab Results  ?Component Value Date  ? WBC 5.0 06/10/2021  ? HGB 14.9 06/10/2021  ? HCT 45.7 06/10/2021  ? MCV 94 06/10/2021  ? PLT 196 06/10/2021  ? ?Lab Results  ?Component Value Date  ? CREATININE 1.22 06/10/2021  ? BUN  23 06/10/2021  ? NA 145 (H) 06/10/2021  ? K 4.7 06/10/2021  ? CL 101 06/10/2021  ? CO2 26 06/10/2021  ? ?Lab Results  ?Component Value Date  ? ALT 18 06/10/2021  ? AST 26 06/10/2021  ? ALKPHOS 33 (L) 06/10/2021  ? BILITOT 0.6 06/10/2021  ? ?Lab Results  ?Component Value Date  ? CHOL 121 06/10/2021  ? HDL 51 06/10/2021  ? [place] 56 06/10/2021  ? TRIG 66 06/10/2021  ? CHOLHDL 2.4 06/10/2021  ?  ?Lab Results  ?Component Value Date  ? HGBA1C 7.1 (A) 08/26/2021  ? ? ?Assessment & Plan  ?  ?1. Congestive heart failure: ?-Echo completed 10/20 with LV dysfunction and  EF of 35-40%.  He is compliant with all medications and is euvolemic on examination today. ?-Low sodium diet, fluid restriction <2L, and daily weights encouraged. Educated to contact our office for weight gain of 2 lbs overnight or 5 lbs in one week. ?-Continue Current GDMT with Spirolactone,Coreg,Entresto, and Jardiance ? ? ?2. Atrial fibrillation: ?-EKG with atrial fibrillation with a rate of 61 today ?-CHA2DS2-VASc Score = 5 [CHF History: 1, HTN History: 1, Diabetes History: 1, Stroke History: 0, Vascular Disease History: 1, Age Score: 1, Gender Score: 0].  Therefore, the patient's annual risk of stroke is 7.2 %.     ?-Patient currently on Xarelto 20 mg dose calculated correctly ?-Current creatinine clearance CrCl >73 mL/min ?-Pre surgical instruction provided for holding Xarelto for 2 days prior to procedure. ? ? ?3. Coronary artery disease ?-Stable with no anginal symptoms. No indication for ischemic evaluation.   ?-Current GDMT with atorvastatin 80 mg, Coreg 25 mg no ASA due to Xarelto. ? ?4. Preoperative surgical clearance: ?Preoperative clearance requested for colonoscopy ?-Procedure deemed very low risk ?-Patient able to complete 4 METS of activity ?-According to the Revised Cardiac Risk Index (RCRI), his   ?His Functional Capacity in METs is: 9.89 according to the [facility] Activity Status Index (DASI).   ?-He is clear for his colonoscopy from a  cardiovascular standpoint ? ?5. Thoracic aneurysm: ?-Recommendations for semiannual imaging last completed 12/21 ?-CT aorta ordered to be updated. Does not need to be completed prior to colonoscopy per discussion

## 2021-12-02 ENCOUNTER — Ambulatory Visit (INDEPENDENT_AMBULATORY_CARE_PROVIDER_SITE_OTHER): Payer: Medicare Other | Admitting: Nurse Practitioner

## 2021-12-02 ENCOUNTER — Encounter (HOSPITAL_BASED_OUTPATIENT_CLINIC_OR_DEPARTMENT_OTHER): Payer: Self-pay | Admitting: Nurse Practitioner

## 2021-12-02 ENCOUNTER — Other Ambulatory Visit: Payer: Self-pay

## 2021-12-02 VITALS — BP 102/72 | HR 62 | Ht 69.0 in | Wt 187.4 lb

## 2021-12-02 DIAGNOSIS — I4819 Other persistent atrial fibrillation: Secondary | ICD-10-CM | POA: Diagnosis not present

## 2021-12-02 DIAGNOSIS — I7121 Aneurysm of the ascending aorta, without rupture: Secondary | ICD-10-CM

## 2021-12-02 DIAGNOSIS — Z7901 Long term (current) use of anticoagulants: Secondary | ICD-10-CM

## 2021-12-02 NOTE — Telephone Encounter (Signed)
? ? ?  Patient Name: Theodore Bush  ?DOB: 1951-07-20 ?MRN: [id number] ? ?Primary Cardiologist: Peter Martinique, MD ? ?Given past medical history and time since last visit, based on ACC/AHA guidelines, Theodore Bush would be at acceptable risk for the planned procedure without further cardiovascular testing. Seen today in clinic by Ambrose Pancoast, NP. See clinic note for additional details. May hold Xarelto 2 days prior to planned procedure.  ? ?Of note, patient misunderstood and thought his appt today was for colonoscopy so did his prep. Will route to GI team so they can send him new prep ? ?The patient was advised that if he develops new symptoms prior to surgery to contact our office to arrange for a follow-up visit, and he verbalized understanding. ? ?I will route this recommendation to the requesting party via Epic fax function  ? ?Please call with questions. ? ?Loel Dubonnet, NP ?12/02/2021, 2:33 PM  ?

## 2021-12-02 NOTE — Patient Instructions (Addendum)
Medication Instructions:  ?Continue your current medications.  ? ?Please hold your Xarelto 2 days prior to your colonoscopy ? ?*If you need a refill on your cardiac medications before your next appointment, please call your pharmacy* ? ? ?Lab Work: ?Your physician recommends that you return for lab work 3-5 days prior to CT scan for BMP.  ? ?Please return for Lab work. You may come to the...  ? ?[facility] (3rd floor) ?[address] [place], [place]  ?Open: 8am-Noon and 1pm-4:30pm  ? ?[place] [facility] ?[place]  ? ?[employer]- Any location ? ?**no appointments needed**  ? ?If you have labs (blood work) drawn today and your tests are completely normal, you will receive your results only by: ?MyChart Message (if you have MyChart) OR ?A paper copy in the mail ?If you have any lab test that is abnormal or we need to change your treatment, we will call you to review the results. ? ? ?Testing/Procedures: ?Your provider has recommended a CT scan of your aorta.  ? ?Follow-Up: ?At [facility], you and your health needs are our priority.  As part of our continuing mission to provide you with exceptional heart care, we have created designated Provider Care Teams.  These Care Teams include your primary Cardiologist (physician) and Advanced Practice Providers (APPs -  Physician Assistants and Nurse Practitioners) who all work together to provide you with the care you need, when you need it. ? ?We recommend signing up for the patient portal called "MyChart".  Sign up information is provided on this After Visit Summary.  MyChart is used to connect with patients for Virtual Visits (Telemedicine).  Patients are able to view lab/test results, encounter notes, upcoming appointments, etc.  Non-urgent messages can be sent to your provider as well.   ?To learn more about what you can do with MyChart, go to [email or web address].   ? ?Your next appointment:    ?06/09/22 at 9:40 AM with Dr. Martinique at [facility] office ? ? ?Other Instructions ? ?Information About Your Aneurysm ?  ?One of your tests has shown an aneurysm of your ascending thoracic aorta. The word "aneurysm" refers to a bulge in an artery (blood vessel). Most people think of them in the context of an emergency, but yours was found incidentally. At this point there is nothing you need to do from a procedure standpoint, but there are some important things to keep in mind for day-to-day life. ?  ?Mainstays of therapy for aneurysms include very good blood pressure control, healthy lifestyle, and avoiding tobacco products and street drugs. Research has raised concern that antibiotics in the fluoroquinolone class could be associated with increased risk of having an aneurysm develop or tear. This includes medicines that end in "floxacin," like Cipro or Levaquin. Make sure to discuss this information with other healthcare providers if you require antibiotics. ?  ?Since aneurysms can run in families, you should discuss your diagnosis with first degree relatives as they may need to be screened for this. Regular mild-moderate physical exercise is important, but avoid heavy lifting/weight lifting over 30lbs, chopping wood, shoveling snow or digging heavy earth with a shovel. It is best to avoid activities that cause grunting or straining (medically referred to as a "Valsalva maneuver"). This happens when a person bears down against a closed throat to increase the strength of arm or abdominal muscles. There's often a tendency to do this when lifting heavy weights, doing sit-ups, push-ups or chin-ups,  etc., but it may be harmful. ?  ?This is a finding I would expect to be monitored periodically by your cardiology team. Most unruptured thoracic aortic aneurysms cause no symptoms, so they are often found during exams for other conditions. Contact a health care provider if you develop any discomfort in your upper back, neck,  abdomen, trouble swallowing, cough or hoarseness, or unexplained weight loss. Get help right away if you develop severe pain in your upper back or abdomen that may move into your chest and arms, or any other concerning symptoms such as shortness of breath or fever. ? ?

## 2021-12-05 ENCOUNTER — Telehealth: Payer: Self-pay

## 2021-12-05 ENCOUNTER — Other Ambulatory Visit: Payer: Self-pay

## 2021-12-05 MED ORDER — CLENPIQ 10-3.5-12 MG-GM -GM/160ML PO SOLN: 1.0000 | ORAL | 0 refills | Status: DC

## 2021-12-05 NOTE — Telephone Encounter (Signed)
Colonoscopy procedure is scheduled on 01/06/22 at 11:00 am with the patient. Informed to hold Xarelto 2 days prior the procedure. Informed to pick up the prep at his pharmacy soon. New instruction for colonoscopy has sent to his Mychart and mailed at his home address as well. Advised the patient to give [place] a call if he has any questions or concerns. Patient voiced understanding at the end of the call. ?

## 2021-12-05 NOTE — Telephone Encounter (Signed)
-----   Message from Lavena Bullion, DO sent at 12/05/2021 12:58 PM EDT ----- ?Thanks for forwarding. Please ensure that he gets a new prep with new set of instructions for his colonoscopy (he drank prior the Cardiology appt).  ? ?----- Message ----- ?From: Loel Dubonnet, NP ?Sent: 12/02/2021   3:01 PM EDT ?To: Lavena Bullion, DO, Howell Pringle, CMA ? ?Preop ? ?

## 2021-12-09 ENCOUNTER — Encounter: Payer: Self-pay | Admitting: Endocrinology

## 2021-12-09 ENCOUNTER — Other Ambulatory Visit: Payer: Self-pay

## 2021-12-09 ENCOUNTER — Ambulatory Visit (INDEPENDENT_AMBULATORY_CARE_PROVIDER_SITE_OTHER): Payer: Medicare Other | Admitting: Endocrinology

## 2021-12-09 VITALS — BP 102/70 | HR 61 | Ht 69.0 in | Wt 183.4 lb

## 2021-12-09 DIAGNOSIS — Z794 Long term (current) use of insulin: Secondary | ICD-10-CM | POA: Diagnosis not present

## 2021-12-09 DIAGNOSIS — E118 Type 2 diabetes mellitus with unspecified complications: Secondary | ICD-10-CM

## 2021-12-09 MED ORDER — TIRZEPATIDE 2.5 MG/0.5ML ~~LOC~~ SOAJ: 2.5000 mg | SUBCUTANEOUS | 3 refills | Status: DC

## 2021-12-09 NOTE — Progress Notes (Signed)
? ?Subjective:  ? ? Patient ID: Theodore Bush, male    DOB: June 21, 1951, 71 y.o.   MRN: [id number] ? ?HPI ?Pt returns for f/u of diabetes mellitus: ?DM type: Insulin-requiring type 2 ?Dx'ed: 1995 ?Complications: DR, PAD, CAD, and CRI ?Therapy: insulin since 2007, Mounjaro, and 2 oral meds.   ?DKA: never ?Severe hypoglycemia: never ?Pancreatitis: never ?Pancreatic imaging: no result known ?SDOH: none ?Other: He declines continuous glucose monitor.  ?Interval history: He takes meds as rx'ed.  no cbg record, but states cbg's vary from 73-164.  He skips the insulin 1-2 times per week.   ?Past Medical History:  ?Diagnosis Date  ? Aortic arch atherosclerosis ([place]) 06/11/2019  ? Asthma   ? Atrial fibrillation ([place]) 09/02/2018  ? CHF (congestive heart failure) ([place])   ? Controlled diabetes mellitus type 2 with complications ([place]) 37/90/2409  ? Coronary artery calcification 06/11/2019  ? Hyperlipidemia   ? Hypertension   ? Pneumonia 2019  ? Staph infection   ? to elbow 15-55yr ago  ? ? ?Past Surgical History:  ?Procedure Laterality Date  ? PALATE / UVULA BIOPSY / EXCISION    ? trimmed several times  ? ? ?Social History  ? ?Socioeconomic History  ? Marital status: Married  ?  Spouse name: Not on file  ? Number of children: 2  ? Years of education: Not on file  ? Highest education level: Not on file  ?Occupational History  ? Occupation: Retired  ?  Comment: [employer]  ?Tobacco Use  ? Smoking status: Never  ? Smokeless tobacco: Current  ?  Types: Snuff  ?Vaping Use  ? Vaping Use: Never used  ?Substance and Sexual Activity  ? Alcohol use: Yes  ?  Alcohol/week: 10.0 standard drinks  ?  Types: 10 Shots of liquor per week  ?  Comment: 10 drinks a week  ? Drug use: Yes  ?  Types: Marijuana, Hydrocodone  ?  Comment: occ, once a week per patient.  ? Sexual activity: Not on file  ?Other Topics Concern  ? Not on file  ?Social History Narrative  ? Not on file  ? ?Social Determinants of Health  ? ?Financial Resource Strain: Low  Risk   ? Difficulty of Paying Living Expenses: Not hard at all  ?Food Insecurity: No Food Insecurity  ? Worried About RCharity fundraiserin the Last Year: Never true  ? Ran Out of Food in the Last Year: Never true  ?Transportation Needs: No Transportation Needs  ? Lack of Transportation (Medical): No  ? Lack of Transportation (Non-Medical): No  ?Physical Activity: Inactive  ? Days of Exercise per Week: 0 days  ? Minutes of Exercise per Session: 0 min  ?Stress: No Stress Concern Present  ? Feeling of Stress : Not at all  ?Social Connections: Not on file  ?Intimate Partner Violence: Not on file  ? ? ?Current Outpatient Medications on File Prior to Visit  ?Medication Sig Dispense Refill  ? Adalimumab 40 MG/0.4ML PNKT Inject 1 each into the skin. 2 times a months.    ? albuterol (VENTOLIN HFA) 108 (90 Base) MCG/ACT inhaler INHALE 1 PUFF INTO THE LUNGS EVERY 6 HOURS AS NEEDED FOR WHEEZING OR SHORTNESS OF BREATH 18 g 0  ? atorvastatin (LIPITOR) 80 MG tablet Take 1 tablet (80 mg total) by mouth daily. 90 tablet 2  ? carvedilol (COREG) 25 MG tablet TAKE 1 TABLET(25 MG) BY MOUTH TWICE DAILY. Please make overdue appt with Dr. JMartiniquebefore anymore refills.  Thank you 1st attempt 60 tablet 0  ? Fenofibrate 50 MG CAPS TAKE 1 CAPSULE(50 MG) BY MOUTH DAILY 90 capsule 0  ? glucose blood (ACCU-CHEK AVIVA PLUS) test strip TEST 1 TO 2 TIMES A DAY 100 strip 3  ? HYDROcodone-acetaminophen (NORCO) 10-325 MG tablet Take 1 tablet by mouth every 6 (six) hours as needed.    ? insulin glargine (LANTUS SOLOSTAR) 100 UNIT/ML Solostar Pen Inject 15 Units into the skin every morning. 15 mL 5  ? Insulin Pen Needle (PEN NEEDLES) 32G X 4 MM MISC 1 each by Other route daily. 100 each 2  ? JARDIANCE 25 MG TABS tablet TAKE 1 TABLET(25 MG) BY MOUTH DAILY 90 tablet 0  ? metFORMIN (GLUCOPHAGE-XR) 500 MG 24 hr tablet Take 4 tablets (2,000 mg total) by mouth daily with breakfast. 360 tablet 3  ? montelukast (SINGULAIR) 10 MG tablet TAKE 1 TABLET(10 MG) BY  MOUTH DAILY 90 tablet 1  ? rivaroxaban (XARELTO) 20 MG TABS tablet TAKE 1 TABLET(20 MG) BY MOUTH DAILY WITH SUPPER 90 tablet 1  ? sacubitril-valsartan (ENTRESTO) 24-26 MG Take 1 tablet by mouth 2 (two) times daily. 180 tablet 3  ? Sod Picosulfate-Mag Ox-Cit Acd (CLENPIQ) 10-3.5-12 MG-GM -GM/160ML SOLN Take 1 kit by mouth as directed. 320 mL 0  ? spironolactone (ALDACTONE) 25 MG tablet TAKE 1 TABLET BY MOUTH EVERY 2 DAYS 45 tablet 0  ? SYMBICORT 160-4.5 MCG/ACT inhaler Inhale 2 puffs into the lungs in the morning and at bedtime. 10.2 g 6  ? ?No current facility-administered medications on file prior to visit.  ? ? ?Allergies  ?Allergen Reactions  ? Quinolones Other (See Comments)  ? ? ?Family History  ?Problem Relation Age of Onset  ? Lupus Mother   ? Hypertension Mother   ? Diabetes Father   ? Diabetes Paternal Grandmother   ? Colon cancer Neg Hx   ? Stomach cancer Neg Hx   ? Esophageal cancer Neg Hx   ? Rectal cancer Neg Hx   ? ? ?BP 102/70 (BP Location: Left Arm, Patient Position: Sitting, Cuff Size: Normal)   Pulse 61   Ht 5' 9"  (1.753 m)   Wt 183 lb 6.4 oz (83.2 kg)   SpO2 94%   BMI 27.08 kg/m?  ? ? ?Review of Systems ?Nausea is mild.  He has lost 15 lbs since last ov here.   ?   ?Objective:  ? Physical Exam ?VITAL SIGNS:  See vs page.   ?GENERAL: no distress.   ? ? ?A1c=6.7% ? ?   ?Assessment & Plan:  ?Insulin-requiring type 2 DM: overcontrolled ?Weight loss, due to mounjaro.  Pt says he does not like this. ?HTN: BP is low-normal, prob due to weight loss.  Cardiol may be fine with this BP, but pt should check with them.   ? ?Patient Instructions  ?Your blood pressure is on the low side today.  Please see your heart doctor soon, to have it rechecked.   ?check your blood sugar twice a day.  vary the time of day when you check, between before the 3 meals, and at bedtime.  also check if you have symptoms of your blood sugar being too high or too low.  please keep a record of the readings and bring it to your  next appointment here (or you can bring the meter itself).  You can write it on any piece of paper.  please call us sooner if your blood sugar goes below 70, or if most  of your readings are over 200.  ?I have sent 2 prescriptions to your pharmacy: to reduce the [facility], and:  ?Please continue the same Jardiance, Lantus, and metformin.   ?Please come back for a follow-up appointment in 3 months.   ? ? ?

## 2021-12-09 NOTE — Patient Instructions (Addendum)
Your blood pressure is on the low side today.  Please see your heart doctor soon, to have it rechecked.   ?check your blood sugar twice a day.  vary the time of day when you check, between before the 3 meals, and at bedtime.  also check if you have symptoms of your blood sugar being too high or too low.  please keep a record of the readings and bring it to your next appointment here (or you can bring the meter itself).  You can write it on any piece of paper.  please call us sooner if your blood sugar goes below 70, or if most of your readings are over 200.  ?I have sent 2 prescriptions to your pharmacy: to reduce the [facility], and:  ?Please continue the same Jardiance, Lantus, and metformin.   ?Please come back for a follow-up appointment in 3 months.   ?

## 2021-12-17 HISTORY — PX: COLONOSCOPY: SHX174

## 2021-12-19 ENCOUNTER — Other Ambulatory Visit: Payer: Self-pay

## 2021-12-19 ENCOUNTER — Telehealth: Payer: Self-pay | Admitting: Cardiology

## 2021-12-19 DIAGNOSIS — I4819 Other persistent atrial fibrillation: Secondary | ICD-10-CM

## 2021-12-19 DIAGNOSIS — Z7901 Long term (current) use of anticoagulants: Secondary | ICD-10-CM

## 2021-12-19 MED ORDER — CARVEDILOL 25 MG PO TABS: ORAL_TABLET | ORAL | 1 refills | Status: DC

## 2021-12-19 MED ORDER — RIVAROXABAN 20 MG PO TABS: ORAL_TABLET | ORAL | 5 refills | Status: DC

## 2021-12-19 NOTE — Telephone Encounter (Signed)
Refill for Carvedilol (Coreg) 20 MG sent to [facility]. And request for Rivaroxaban (Xarelto) sent to [facility] ?

## 2021-12-19 NOTE — Telephone Encounter (Signed)
Prescription refill request for Xarelto received. ?Indication:Afib ?Last office visit:3/23 ?Scr:1.2 ?Age: 71 ?Weight:83.2 kg ?Clearance:68.37 ml/min ? ? ?Prescription refilled ? ?

## 2021-12-19 NOTE — Telephone Encounter (Signed)
?*  STAT* If patient is at the pharmacy, call can be transferred to refill team. ? ? ?1. Which medications need to be refilled? (please list name of each medication and dose if known)  ?carvedilol (COREG) 25 MG tablet ? ?rivaroxaban (XARELTO) 20 MG TABS tablet ? ?2. Which pharmacy/location (including street and city if local pharmacy) is medication to be sent to?[id number] - [address], [address] AT [address]  ?Phone:  [phone] ?Fax:  [phone] ? ? ?3. Do they need a 30 day or 90 day supply? 30 ds ? ?

## 2021-12-30 ENCOUNTER — Encounter: Payer: Medicare Other | Admitting: Gastroenterology

## 2022-01-03 ENCOUNTER — Other Ambulatory Visit: Payer: Self-pay | Admitting: Medical

## 2022-01-05 ENCOUNTER — Other Ambulatory Visit: Payer: Self-pay | Admitting: Family Medicine

## 2022-01-06 ENCOUNTER — Other Ambulatory Visit: Payer: Self-pay | Admitting: Cardiology

## 2022-01-06 ENCOUNTER — Encounter: Payer: Self-pay | Admitting: Gastroenterology

## 2022-01-06 ENCOUNTER — Ambulatory Visit (AMBULATORY_SURGERY_CENTER): Payer: Medicare Other | Admitting: Gastroenterology

## 2022-01-06 VITALS — BP 106/74 | HR 86 | Temp 98.9°F | Resp 12 | Ht 69.0 in | Wt 197.0 lb

## 2022-01-06 DIAGNOSIS — D120 Benign neoplasm of cecum: Secondary | ICD-10-CM

## 2022-01-06 DIAGNOSIS — K640 First degree hemorrhoids: Secondary | ICD-10-CM

## 2022-01-06 DIAGNOSIS — Z86010 Personal history of colonic polyps: Secondary | ICD-10-CM | POA: Diagnosis not present

## 2022-01-06 DIAGNOSIS — K5730 Diverticulosis of large intestine without perforation or abscess without bleeding: Secondary | ICD-10-CM

## 2022-01-06 MED ORDER — SODIUM CHLORIDE 0.9 % IV SOLN: 500.0000 mL | Freq: Once | INTRAVENOUS | Status: DC

## 2022-01-06 NOTE — Progress Notes (Signed)
PT taken to PACU. Monitors in place. VSS. Report given to RN. 

## 2022-01-06 NOTE — Progress Notes (Signed)
Called to room to assist during endoscopic procedure.  Patient ID and intended procedure confirmed with present staff. Received instructions for my participation in the procedure from the performing physician.  

## 2022-01-06 NOTE — Patient Instructions (Signed)
Impression/Recommendations: ? ?Polyp, diverticulosis, and hemorrhoid handouts given to patient. ? ?Resume previous diet. ?Continue present medications. ?Await pathology results. ? ?Repeat colonoscopy in 3-5 years for surveillance based on pathology results. ? ?Return to GI clinic as needed. ? ?YOU HAD AN ENDOSCOPIC PROCEDURE TODAY AT [facility]:   Refer to the procedure report that was given to you for any specific questions about what was found during the examination.  If the procedure report does not answer your questions, please call your gastroenterologist to clarify.  If you requested that your care partner not be given the details of your procedure findings, then the procedure report has been included in a sealed envelope for you to review at your convenience later. ? ?YOU SHOULD EXPECT: Some feelings of bloating in the abdomen. Passage of more gas than usual.  Walking can help get rid of the air that was put into your GI tract during the procedure and reduce the bloating. If you had a lower endoscopy (such as a colonoscopy or flexible sigmoidoscopy) you may notice spotting of blood in your stool or on the toilet paper. If you underwent a bowel prep for your procedure, you may not have a normal bowel movement for a few days. ? ?Please Note:  You might notice some irritation and congestion in your nose or some drainage.  This is from the oxygen used during your procedure.  There is no need for concern and it should clear up in a day or so. ? ?SYMPTOMS TO REPORT IMMEDIATELY: ? ?Following lower endoscopy (colonoscopy or flexible sigmoidoscopy): ? Excessive amounts of blood in the stool ? Significant tenderness or worsening of abdominal pains ? Swelling of the abdomen that is new, acute ? Fever of 100?F or higher ? ?For urgent or emergent issues, a gastroenterologist can be reached at any hour by calling [phone]. ?Do not use MyChart messaging for urgent concerns.  ? ? ?DIET:  We do  recommend a small meal at first, but then you may proceed to your regular diet.  Drink plenty of fluids but you should avoid alcoholic beverages for 24 hours. ? ?ACTIVITY:  You should plan to take it easy for the rest of today and you should NOT DRIVE or use heavy machinery until tomorrow (because of the sedation medicines used during the test).   ? ?FOLLOW UP: ?Our staff will call the number listed on your records 48-72 hours following your procedure to check on you and address any questions or concerns that you may have regarding the information given to you following your procedure. If we do not reach you, we will leave a message.  We will attempt to reach you two times.  During this call, we will ask if you have developed any symptoms of COVID 19. If you develop any symptoms (ie: fever, flu-like symptoms, shortness of breath, cough etc.) before then, please call [phone].  If you test positive for Covid 19 in the 2 weeks post procedure, please call and report this information to [place].   ? ?If any biopsies were taken you will be contacted by phone or by letter within the next 1-3 weeks.  Please call us at [phone] if you have not heard about the biopsies in 3 weeks.  ? ? ?SIGNATURES/CONFIDENTIALITY: ?You and/or your care partner have signed paperwork which will be entered into your electronic medical record.  These signatures attest to the fact that that the information above on your After Visit Summary has been  reviewed and is understood.  Full responsibility of the confidentiality of this discharge information lies with you and/or your care-partner.  ?

## 2022-01-06 NOTE — Op Note (Signed)
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                          on last colonoscopy (< 3 yrs) ?                          Last Colonoscopy (03/2020): 9 polyps ranging 3-6 mm  ?                          (path tubular adenomas:), 8 mm pedunculated  ?                          rectosigmoid polyp (path: Tubular adenoma), 8 mm  ?                          distal rectal polyp (path: Leiomyoma),  ?                          Diverticulosis, internal hemorrhoids. Normal TI. ?Medicines:                Monitored Anesthesia Care ?Procedure:                Pre-Anesthesia Assessment: ?                          - Prior to the procedure, a History and Physical  ?                          was performed, and patient medications and  ?                          allergies were reviewed. The patient's tolerance of  ?                          previous anesthesia was also reviewed. The risks  ?                          and benefits of the procedure and the sedation  ?                          options and risks were discussed with the patient.  ?                          All questions were answered, and informed consent  ?                          was obtained. Prior Anticoagulants: The patient has  ?  taken Xarelto (rivaroxaban), last dose was 2 days  ?                          prior to procedure. ASA Grade Assessment: III - A  ?                          patient with severe systemic disease. After  ?                          reviewing the risks and benefits, the patient was  ?                          deemed in satisfactory condition to undergo the  ?                          procedure. ?                          After  obtaining informed consent, the colonoscope  ?                          was passed under direct vision. Throughout the  ?                          procedure, the patient's blood pressure, pulse, and  ?                          oxygen saturations were monitored continuously. The  ?                          Olympus CF-HQ190L ([id number]) Colonoscope was  ?                          introduced through the anus and advanced to the the  ?                          cecum, identified by appendiceal orifice and  ?                          ileocecal valve. The colonoscopy was performed  ?                          without difficulty. The patient tolerated the  ?                          procedure well. The quality of the bowel  ?                          preparation was good. The ileocecal valve,  ?                          appendiceal orifice, and rectum were photographed. ?Scope In: 11:44:02 AM ?Scope Out: 12:01:58 PM ?Scope Withdrawal Time: 0 hours 14 minutes 49 seconds  ?Total Procedure Duration: 0 hours 17 minutes  56 seconds  ?Findings:                 The perianal and digital rectal examinations were  ?                          normal. ?                          Two sessile polyps were found in the cecum. The  ?                          polyps were 2 to 4 mm in size. These polyps were  ?                          removed with a cold snare. Resection and retrieval  ?                          were complete. Estimated blood loss was minimal. ?                          A few small-mouthed diverticula were found in the  ?                          sigmoid colon. ?                          The exam was otherwise normal throughout the  ?                          remainder of the colon. ?                          Non-bleeding internal hemorrhoids were found during  ?                          retroflexion. The hemorrhoids were small. ?Complications:            No immediate complications. ?Estimated Blood Loss:     Estimated blood loss  was minimal. ?Impression:               - Two 2 to 4 mm polyps in the cecum, removed with a  ?                          cold snare. Resected and retrieved. ?                          - Diverticulosis in the sigmoid colon. ?                          - Non-bleeding internal hemorrhoids. ?Recommendation:           - Patient has a contact number available for  ?                          emergencies. The signs and symptoms of potential  ?  delayed complications were discussed with the  ?                          patient. Return to normal activities tomorrow.  ?                          Written discharge instructions were provided to the  ?                          patient. ?                          - Resume previous diet. ?                          - Continue present medications. ?                          - Await pathology results. ?                          - Repeat colonoscopy in 3 - 5 years for  ?                          surveillance based on pathology results. ?                          - Return to GI clinic PRN. ?Gerrit Heck, MD ?01/06/2022 12:06:47 PM ?

## 2022-01-06 NOTE — Progress Notes (Signed)
? ?GASTROENTEROLOGY PROCEDURE H&P NOTE  ? ?Primary Care Physician: ?Irene Pap, PA-C ? ? ? ?Reason for Procedure:   Colon polyp surveillance ? ?Plan:    Colonoscopy ? ?Patient is appropriate for endoscopic procedure(s) in the ambulatory ([facility]) setting. ? ?The nature of the procedure, as well as the risks, benefits, and alternatives were carefully and thoroughly reviewed with the patient. Ample time for discussion and questions allowed. The patient understood, was satisfied, and agreed to proceed.  ? ? ? ?HPI: ?Theodore Bush is a 71 y.o. male who presents for colonoscopy for continued polyp surveillance.  ? ?Holding Xarelto x2 days for procedure today.  ? ? ?Endoscopic History: ?- Colonoscopy (03/2020): 9 polyps ranging 3-6 mm (path tubular adenomas:), 8 mm pedunculated rectosigmoid polyp (path: Tubular adenoma), 8 mm distal rectal polyp (path: Leiomyoma), Diverticulosis, internal hemorrhoids.  Normal TI.  Repeat in 1 year ? ?Past Medical History:  ?Diagnosis Date  ? 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 Asthma   ? Atrial fibrillation ([place]) 09/02/2018  ? Atrial fibrillation ([place])   ? CHF (congestive heart failure) ([place])   ? Cirrhosis ([place])   ? Controlled diabetes mellitus type 2 with complications ([place]) 76/73/4193  ? Coronary artery calcification 06/11/2019  ? Hyperlipidemia   ? Hypertension   ? Pneumonia 2019  ? Sleep apnea   ? Staph infection   ? to elbow 15-44yr ago  ? ? ?Past Surgical History:  ?Procedure Laterality Date  ? PALATE / UVULA BIOPSY / EXCISION    ? trimmed several times  ? ? ?Prior to Admission medications   ?Medication Sig Start Date End Date Taking? Authorizing Provider  ?Adalimumab 40 MG/0.4ML PNKT Inject 1 each into the skin. 2 times a months.   Yes [provider]  ?atorvastatin (LIPITOR) 80 MG tablet Take 1 tablet (80 mg total) by mouth daily. 06/03/21  Yes JMartinique Peter M, MD  ?carvedilol (COREG) 25 MG tablet TAKE 1 TABLET(25 MG) BY MOUTH TWICE DAILY. 12/19/21  Yes  JMartinique Peter M, MD  ?EDelene Loll24-26 MG TAKE 1 TABLET BY MOUTH TWICE DAILY 01/06/22  Yes JMartinique Peter M, MD  ?ENTRESTO 24-26 MG TAKE 1 TABLET BY MOUTH TWICE DAILY 01/06/22  Yes JMartinique Peter M, MD  ?Fenofibrate 50 MG CAPS TAKE 1 CAPSULE(50 MG) BY MOUTH DAILY 01/05/22  Yes WFrancis GainesB, PA-C  ?glucose blood (ACCU-CHEK AVIVA PLUS) test strip TEST 1 TO 2 TIMES A DAY 10/31/21  Yes WFrancis GainesB, PA-C  ?HYDROcodone-acetaminophen (NORCO) 10-325 MG tablet Take 1 tablet by mouth every 6 (six) hours as needed.   Yes [provider]  ?insulin glargine (LANTUS SOLOSTAR) 100 UNIT/ML Solostar Pen Inject 15 Units into the skin every morning. 10/07/21  Yes ERenato Shin MD  ?Insulin Pen Needle (PEN NEEDLES) 32G X 4 MM MISC 1 each by Other route daily. 08/22/19  Yes Henson, Vickie L, NP-C  ?JARDIANCE 25 MG TABS tablet TAKE 1 TABLET(25 MG) BY MOUTH DAILY 11/28/21  Yes WFrancis GainesB, PA-C  ?metFORMIN (GLUCOPHAGE-XR) 500 MG 24 hr tablet Take 4 tablets (2,000 mg total) by mouth daily with breakfast. 08/26/21  Yes ERenato Shin MD  ?montelukast (SINGULAIR) 10 MG tablet TAKE 1 TABLET(10 MG) BY MOUTH DAILY 09/16/21  Yes Olalere, Adewale A, MD  ?spironolactone (ALDACTONE) 25 MG tablet TAKE 1 TABLET BY MOUTH EVERY 2 DAYS 01/03/22  Yes Tysinger, DCamelia Eng PA-C  ?SYMBICORT 160-4.5 MCG/ACT inhaler Inhale 2 puffs into the lungs in the morning and at bedtime. 09/28/21  Yes Olalere, Adewale A, MD  ?tirzepatide (MOUNJARO) 2.5 MG/0.5ML Pen Inject 2.5 mg into the skin once a week. 12/09/21  Yes Renato Shin, MD  ?albuterol (VENTOLIN HFA) 108 (90 Base) MCG/ACT inhaler INHALE 1 PUFF INTO THE LUNGS EVERY 6 HOURS AS NEEDED FOR WHEEZING OR SHORTNESS OF BREATH 11/21/21   Francis Gaines B, PA-C  ?rivaroxaban (XARELTO) 20 MG TABS tablet TAKE 1 TABLET(20 MG) BY MOUTH DAILY WITH SUPPER 12/19/21   Martinique, Peter M, MD  ? ? ?Current Outpatient Medications  ?Medication Sig Dispense Refill  ? Adalimumab 40 MG/0.4ML PNKT Inject 1 each into the skin. 2  times a months.    ? atorvastatin (LIPITOR) 80 MG tablet Take 1 tablet (80 mg total) by mouth daily. 90 tablet 2  ? carvedilol (COREG) 25 MG tablet TAKE 1 TABLET(25 MG) BY MOUTH TWICE DAILY. 180 tablet 1  ? ENTRESTO 24-26 MG TAKE 1 TABLET BY MOUTH TWICE DAILY 180 tablet 3  ? ENTRESTO 24-26 MG TAKE 1 TABLET BY MOUTH TWICE DAILY 180 tablet 3  ? Fenofibrate 50 MG CAPS TAKE 1 CAPSULE(50 MG) BY MOUTH DAILY 30 capsule 0  ? glucose blood (ACCU-CHEK AVIVA PLUS) test strip TEST 1 TO 2 TIMES A DAY 100 strip 3  ? HYDROcodone-acetaminophen (NORCO) 10-325 MG tablet Take 1 tablet by mouth every 6 (six) hours as needed.    ? insulin glargine (LANTUS SOLOSTAR) 100 UNIT/ML Solostar Pen Inject 15 Units into the skin every morning. 15 mL 5  ? Insulin Pen Needle (PEN NEEDLES) 32G X 4 MM MISC 1 each by Other route daily. 100 each 2  ? JARDIANCE 25 MG TABS tablet TAKE 1 TABLET(25 MG) BY MOUTH DAILY 90 tablet 0  ? metFORMIN (GLUCOPHAGE-XR) 500 MG 24 hr tablet Take 4 tablets (2,000 mg total) by mouth daily with breakfast. 360 tablet 3  ? montelukast (SINGULAIR) 10 MG tablet TAKE 1 TABLET(10 MG) BY MOUTH DAILY 90 tablet 1  ? spironolactone (ALDACTONE) 25 MG tablet TAKE 1 TABLET BY MOUTH EVERY 2 DAYS 45 tablet 0  ? SYMBICORT 160-4.5 MCG/ACT inhaler Inhale 2 puffs into the lungs in the morning and at bedtime. 10.2 g 6  ? tirzepatide (MOUNJARO) 2.5 MG/0.5ML Pen Inject 2.5 mg into the skin once a week. 6 mL 3  ? albuterol (VENTOLIN HFA) 108 (90 Base) MCG/ACT inhaler INHALE 1 PUFF INTO THE LUNGS EVERY 6 HOURS AS NEEDED FOR WHEEZING OR SHORTNESS OF BREATH 18 g 0  ? rivaroxaban (XARELTO) 20 MG TABS tablet TAKE 1 TABLET(20 MG) BY MOUTH DAILY WITH SUPPER 30 tablet 5  ? ?Current Facility-Administered Medications  ?Medication Dose Route Frequency Provider Last Rate Last Admin  ? 0.9 %  sodium chloride infusion  500 mL Intravenous Once Tonia Avino V, DO      ? ? ?Allergies as of 01/06/2022 - Review Complete 01/06/2022  ?Allergen Reaction Noted  ?  Quinolones Other (See Comments) 07/07/2019  ? ? ?Family History  ?Problem Relation Age of Onset  ? Lupus Mother   ? Hypertension Mother   ? Diabetes Father   ? Diabetes Paternal Grandmother   ? Colon cancer Neg Hx   ? Stomach cancer Neg Hx   ? Esophageal cancer Neg Hx   ? Rectal cancer Neg Hx   ? ? ?Social History  ? ?Socioeconomic History  ? Marital status: Married  ?  Spouse name: Not on file  ? Number of children: 2  ? Years of education: Not on file  ? Highest education  level: Not on file  ?Occupational History  ? Occupation: Retired  ?  Comment: [employer]  ?Tobacco Use  ? Smoking status: Never  ? Smokeless tobacco: Current  ?  Types: Snuff  ?Vaping Use  ? Vaping Use: Never used  ?Substance and Sexual Activity  ? Alcohol use: Yes  ?  Alcohol/week: 10.0 standard drinks  ?  Types: 10 Shots of liquor per week  ?  Comment: 10 drinks a week  ? Drug use: Yes  ?  Types: Marijuana, Hydrocodone  ?  Comment: occ, once a week per patient.  ? Sexual activity: Not on file  ?Other Topics Concern  ? Not on file  ?Social History Narrative  ? Not on file  ? ?Social Determinants of Health  ? ?Financial Resource Strain: Low Risk   ? Difficulty of Paying Living Expenses: Not hard at all  ?Food Insecurity: No Food Insecurity  ? Worried About Charity fundraiser in the Last Year: Never true  ? Ran Out of Food in the Last Year: Never true  ?Transportation Needs: No Transportation Needs  ? Lack of Transportation (Medical): No  ? Lack of Transportation (Non-Medical): No  ?Physical Activity: Inactive  ? Days of Exercise per Week: 0 days  ? Minutes of Exercise per Session: 0 min  ?Stress: No Stress Concern Present  ? Feeling of Stress : Not at all  ?Social Connections: Not on file  ?Intimate Partner Violence: Not on file  ? ? ?Physical Exam: ?Vital signs in last 24 hours: ?'@BP'$  132/76 (BP Location: Right Arm, Patient Position: Sitting, Cuff Size: Normal)   Pulse (!) 106   Temp 98.9 ?F (37.2 ?C) (Temporal)   Ht '5\' 9"'$  (1.753 m)    Wt 197 lb (89.4 kg)   SpO2 94%   BMI 29.09 kg/m?  ?GEN: NAD ?EYE: Sclerae anicteric ?ENT: MMM ?CV: Non-tachycardic ?Pulm: CTA b/l ?GI: Soft, NT/ND ?NEURO:  Alert & Oriented x 3 ? ? ?Gerrit Heck, DO ?[facility]

## 2022-01-06 NOTE — Progress Notes (Signed)
Vitals-CW ? ?History reviewed. ? ?Patient is a poor historian.  Doctor aware that pt did not look at his stool, and may not be cleaned out. ?

## 2022-01-10 ENCOUNTER — Telehealth: Payer: Self-pay

## 2022-01-10 NOTE — Telephone Encounter (Signed)
?  Follow up Call- ? ? ?  01/06/2022  ? 10:51 AM 03/26/2020  ? 10:14 AM  ?Call back number  ?Post procedure Call Back phone  # [phone]  ?Permission to leave phone message Yes Yes  ?  ? ?Patient questions: ? ?Do you have a fever, pain , or abdominal swelling? No. ?Pain Score  0 * ? ?Have you tolerated food without any problems? Yes.   ? ?Have you been able to return to your normal activities? Yes.   ? ?Do you have any questions about your discharge instructions: ?Diet   No. ?Medications  No. ?Follow up visit  No. ? ?Do you have questions or concerns about your Care? No. ? ?Actions: ?* If pain score is 4 or above: ?No action needed, pain <4. ? ? ? ? ? ?

## 2022-01-11 ENCOUNTER — Encounter: Payer: Self-pay | Admitting: Gastroenterology

## 2022-01-11 ENCOUNTER — Other Ambulatory Visit: Payer: Self-pay | Admitting: Physician Assistant

## 2022-01-13 ENCOUNTER — Telehealth (HOSPITAL_BASED_OUTPATIENT_CLINIC_OR_DEPARTMENT_OTHER): Payer: Self-pay

## 2022-01-13 ENCOUNTER — Other Ambulatory Visit: Payer: Self-pay | Admitting: Physician Assistant

## 2022-01-13 DIAGNOSIS — I5022 Chronic systolic (congestive) heart failure: Secondary | ICD-10-CM

## 2022-01-13 NOTE — Telephone Encounter (Addendum)
RN called patient to alert him to need for labs before CT, patient verbalizes understanding  ? ? ? ?----- Message from Loel Dubonnet, NP sent at 01/13/2022  3:12 PM EDT ----- ?Regarding: RE: referral ?Contact: [phone] ?Zigmund Daniel - can you please call Mr. Ressel to schedule? CT is ordered.  ? ?Daphene Jaeger - can you please order BMP and tell patient to have labs done 3-5 days prior to CT? TY! ?----- Message ----- ?From: Marylen Ponto, LPN ?Sent: 01/13/2022   3:08 PM EDT ?To: Laury Deep, RN, Loel Dubonnet, NP, # ?Subject: referral                                      ? ?Hi Caitlin,  ?Mr Debroux has not had a chest CT yet? I see where it's been ordered but not scheduled. ?I did speak with him today about this and he said he knew nothing about it. ?Can your office please schedule this Chest CT for him and let him.know ? And then I will be able to scheduled him for a surgical consult, Thanks for your help Linden Dolin ? ? ?

## 2022-01-20 ENCOUNTER — Ambulatory Visit (HOSPITAL_COMMUNITY)
Admission: RE | Admit: 2022-01-20 | Discharge: 2022-01-20 | Disposition: A | Discharge status: Home / Self Care | Payer: Medicare Other | Source: Ambulatory Visit | Attending: Family | Admitting: Family

## 2022-01-20 DIAGNOSIS — I7121 Aneurysm of the ascending aorta, without rupture: Secondary | ICD-10-CM

## 2022-01-20 LAB — POCT I-STAT CREATININE: Creatinine, Ser: 1.2 mg/dL (ref 0.61–1.24)

## 2022-01-20 MED ORDER — IOHEXOL 350 MG/ML SOLN
80.0000 mL | Freq: Once | INTRAVENOUS | Status: AC | PRN
  Administered 2022-01-20: 80 mL via INTRAVENOUS

## 2022-01-20 MED ORDER — SODIUM CHLORIDE (PF) 0.9 % IJ SOLN
INTRAMUSCULAR | Status: AC
  Filled 2022-01-20: qty 50

## 2022-01-21 ENCOUNTER — Other Ambulatory Visit: Payer: Self-pay | Admitting: Family Medicine

## 2022-01-25 ENCOUNTER — Encounter: Payer: Medicare Other | Admitting: Surgery

## 2022-02-02 NOTE — Progress Notes (Addendum)
Established Patient Office Visit  Subjective:  Patient ID: Theodore Bush, male    DOB: August 11, 1951  Age: 71 y.o. MRN: [id number]  CC:  Chief Complaint  Patient presents with   Medication Refill    Med check- he said he is having trouble sleeping and has been nauseous from taking mounjaro    HPI Theodore Bush presents for has tried otc melatonin and a sleep aid but stopped taking them because he didn't want to get "hooked" on them; states he has trouble falling asleep and trouble staying asleep; reports that his Endocrinologist reduced his Mounjaro dose and his nausea is actually getting better; reports that he will schedule another Eye Dr  Appointment soon.   Outpatient Medications Prior to Visit  Medication Sig Dispense Refill   Adalimumab 40 MG/0.4ML PNKT Inject 1 each into the skin. 2 times a months.     albuterol (VENTOLIN HFA) 108 (90 Base) MCG/ACT inhaler INHALE 1 PUFF INTO THE LUNGS EVERY 6 HOURS AS NEEDED FOR WHEEZING OR SHORTNESS OF BREATH 18 g 0   atorvastatin (LIPITOR) 80 MG tablet Take 1 tablet (80 mg total) by mouth daily. 90 tablet 2   carvedilol (COREG) 25 MG tablet TAKE 1 TABLET(25 MG) BY MOUTH TWICE DAILY. 180 tablet 1   ENTRESTO 24-26 MG TAKE 1 TABLET BY MOUTH TWICE DAILY 180 tablet 3   ENTRESTO 24-26 MG TAKE 1 TABLET BY MOUTH TWICE DAILY 180 tablet 3   glucose blood (ACCU-CHEK AVIVA PLUS) test strip TEST 1 TO 2 TIMES A DAY 100 strip 3   HYDROcodone-acetaminophen (NORCO) 10-325 MG tablet Take 1 tablet by mouth every 6 (six) hours as needed.     insulin glargine (LANTUS SOLOSTAR) 100 UNIT/ML Solostar Pen Inject 15 Units into the skin every morning. 15 mL 5   Insulin Pen Needle (PEN NEEDLES) 32G X 4 MM MISC 1 each by Other route daily. 100 each 2   metFORMIN (GLUCOPHAGE-XR) 500 MG 24 hr tablet Take 4 tablets (2,000 mg total) by mouth daily with breakfast. 360 tablet 3   montelukast (SINGULAIR) 10 MG tablet TAKE 1 TABLET(10 MG) BY MOUTH DAILY 90 tablet 1   rivaroxaban  (XARELTO) 20 MG TABS tablet TAKE 1 TABLET(20 MG) BY MOUTH DAILY WITH SUPPER 30 tablet 5   SYMBICORT 160-4.5 MCG/ACT inhaler Inhale 2 puffs into the lungs in the morning and at bedtime. 10.2 g 6   tirzepatide (MOUNJARO) 2.5 MG/0.5ML Pen Inject 2.5 mg into the skin once a week. 6 mL 3   Fenofibrate 50 MG CAPS TAKE 1 CAPSULE(50 MG) BY MOUTH DAILY 30 capsule 0   JARDIANCE 25 MG TABS tablet TAKE 1 TABLET(25 MG) BY MOUTH DAILY 90 tablet 0   spironolactone (ALDACTONE) 25 MG tablet TAKE 1 TABLET BY MOUTH EVERY 2 DAYS 45 tablet 0   tirzepatide (MOUNJARO) 5 MG/0.5ML Pen See admin instructions. (Patient not taking: Reported on 02/03/2022)     0.9 %  sodium chloride infusion      No facility-administered medications prior to visit.    Allergies  Allergen Reactions   Quinolones Other (See Comments)    Patient Care Team: Marcellina Millin as PCP - General (Physician Assistant) Martinique, Peter M, MD as PCP - Cardiology (Cardiology) Elayne Snare, MD as Consulting Physician (Endocrinology)  ROS Review of Systems  Constitutional:  Negative for activity change, chills, fatigue and fever.  HENT:  Negative for congestion, ear pain, hearing loss and voice change.   Eyes:  Negative for pain and  redness.  Respiratory:  Negative for cough and shortness of breath.   Cardiovascular:  Negative for leg swelling.  Gastrointestinal:  Positive for nausea. Negative for constipation, diarrhea and vomiting.  Endocrine: Negative for polyuria.  Genitourinary:  Negative for flank pain and frequency.  Musculoskeletal:  Negative for joint swelling and neck pain.  Skin:  Negative for rash.  Neurological:  Negative for dizziness.  Hematological:  Does not bruise/bleed easily.  Psychiatric/Behavioral:  Positive for sleep disturbance. Negative for agitation and behavioral problems.      Objective:    Physical Exam Vitals reviewed.  Constitutional:      General: He is not in acute distress.    Appearance: Normal  appearance.  HENT:     Head: Normocephalic and atraumatic.     Right Ear: External ear normal.     Left Ear: External ear normal.     Nose: No congestion.  Eyes:     Extraocular Movements: Extraocular movements intact.     Conjunctiva/sclera: Conjunctivae normal.     Pupils: Pupils are equal, round, and reactive to light.  Cardiovascular:     Rate and Rhythm: Normal rate and regular rhythm.     Pulses: Normal pulses.     Heart sounds: Normal heart sounds.  Pulmonary:     Effort: Pulmonary effort is normal.     Breath sounds: Normal breath sounds. No wheezing.  Abdominal:     General: Bowel sounds are normal.     Palpations: Abdomen is soft.  Musculoskeletal:        General: Normal range of motion.     Cervical back: Normal range of motion.     Right lower leg: No edema.     Left lower leg: No edema.  Skin:    General: Skin is warm and dry.  Neurological:     Mental Status: He is alert and oriented to person, place, and time.  Psychiatric:        Mood and Affect: Mood normal.        Behavior: Behavior normal.    BP 110/60   Pulse 63   Ht '5\' 9"'$  (1.753 m)   Wt 180 lb 12.8 oz (82 kg)   SpO2 96%   BMI 26.70 kg/m   Wt Readings from Last 3 Encounters:  02/03/22 180 lb 12.8 oz (82 kg)  02/03/22 179 lb (81.2 kg)  01/06/22 197 lb (89.4 kg)    Results for orders placed or performed during the hospital encounter of 01/20/22  I-STAT creatinine  Result Value Ref Range   Creatinine, Ser 1.20 0.61 - 1.24 mg/dL       The ASCVD Risk score (Arnett DK, et al., 2019) failed to calculate for the following reasons:   The valid total cholesterol range is 130 to 320 mg/dL    Assessment & Plan:   Problem List Items Addressed This Visit       Cardiovascular and Mediastinum   Essential hypertension    controlled, eat a low salt diet, do not add any salt to food when cooking, avoid processed foods, avoid fried foods        Relevant Medications   Fenofibrate 50 MG CAPS    spironolactone (ALDACTONE) 25 MG tablet     Endocrine   Type 2 diabetes mellitus with hyperglycemia, with long-term current use of insulin (HCC) - Primary    Followed by Endocrinology, eat a low sugar diet, avoid starchy food with a lot of carbohydrates, avoid fried and processed  foods; last hgb a1c 7.1 on 08/26/2021       Relevant Medications   empagliflozin (JARDIANCE) 25 MG TABS tablet     Other   Primary insomnia   Body mass index (BMI) of 26.0 to 26.9 in adult    Meds ordered this encounter  Medications   Fenofibrate 50 MG CAPS    Sig: Take 1 capsule (50 mg total) by mouth daily.    Dispense:  90 capsule    Refill:  2    Order Specific Question:   Supervising Provider    Answer:   Denita Lung [[id number]]   empagliflozin (JARDIANCE) 25 MG TABS tablet    Sig: Take 1 tablet (25 mg total) by mouth daily.    Dispense:  90 tablet    Refill:  2    Order Specific Question:   Supervising Provider    Answer:   Denita Lung [[id number]]   spironolactone (ALDACTONE) 25 MG tablet    Sig: Take 1 tablet (25 mg total) by mouth every other day.    Dispense:  45 tablet    Refill:  2    Order Specific Question:   Supervising Provider    Answer:   Denita Lung [[id number]]   traZODone (DESYREL) 50 MG tablet    Sig: Take 0.5-1 tablets (25-50 mg total) by mouth at bedtime as needed for sleep.    Dispense:  90 tablet    Refill:  2    Order Specific Question:   Supervising Provider    Answer:   Denita Lung [[id number]]    Follow-up: Return in about 4 months (around 06/06/2022) for Return for Annual Exam with PCP Jimmye Norman.    Irene Pap, PA-C

## 2022-02-03 ENCOUNTER — Encounter: Payer: Self-pay | Admitting: Physician Assistant

## 2022-02-03 ENCOUNTER — Institutional Professional Consult (permissible substitution) (INDEPENDENT_AMBULATORY_CARE_PROVIDER_SITE_OTHER): Payer: Medicare Other | Admitting: Thoracic Surgery (Cardiothoracic Vascular Surgery)

## 2022-02-03 ENCOUNTER — Ambulatory Visit (INDEPENDENT_AMBULATORY_CARE_PROVIDER_SITE_OTHER): Payer: Medicare Other | Admitting: Physician Assistant

## 2022-02-03 VITALS — BP 110/60 | HR 63 | Ht 69.0 in | Wt 180.8 lb

## 2022-02-03 VITALS — BP 106/66 | HR 63 | Resp 20 | Ht 69.0 in | Wt 179.0 lb

## 2022-02-03 DIAGNOSIS — I7121 Aneurysm of the ascending aorta, without rupture: Secondary | ICD-10-CM | POA: Diagnosis not present

## 2022-02-03 DIAGNOSIS — I10 Essential (primary) hypertension: Secondary | ICD-10-CM

## 2022-02-03 DIAGNOSIS — F5101 Primary insomnia: Secondary | ICD-10-CM | POA: Insufficient documentation

## 2022-02-03 DIAGNOSIS — Z6826 Body mass index (BMI) 26.0-26.9, adult: Secondary | ICD-10-CM | POA: Diagnosis not present

## 2022-02-03 DIAGNOSIS — E1165 Type 2 diabetes mellitus with hyperglycemia: Secondary | ICD-10-CM | POA: Diagnosis not present

## 2022-02-03 DIAGNOSIS — Z794 Long term (current) use of insulin: Secondary | ICD-10-CM

## 2022-02-03 HISTORY — DX: Type 2 diabetes mellitus with hyperglycemia: E11.65

## 2022-02-03 MED ORDER — FENOFIBRATE 50 MG PO CAPS: 1.0000 | ORAL_CAPSULE | Freq: Every day | ORAL | 2 refills | Status: DC

## 2022-02-03 MED ORDER — TRAZODONE HCL 50 MG PO TABS: 25.0000 mg | ORAL_TABLET | Freq: Every evening | ORAL | 2 refills | Status: DC | PRN

## 2022-02-03 MED ORDER — EMPAGLIFLOZIN 25 MG PO TABS: 25.0000 mg | ORAL_TABLET | Freq: Every day | ORAL | 2 refills | Status: DC

## 2022-02-03 MED ORDER — SPIRONOLACTONE 25 MG PO TABS: 25.0000 mg | ORAL_TABLET | ORAL | 2 refills | Status: DC

## 2022-02-03 NOTE — Patient Instructions (Signed)
You can take OTC Vitamin B 6 to help with nausea

## 2022-02-03 NOTE — Progress Notes (Signed)
[name]       [place] [address]             [phone]        Theodore Bush [facility] Medical Record #[id number] Date of Birth: [birth year]  Referring: Theodore Dubonnet, NP Primary Care: Marcellina Millin Primary Cardiologist:Peter Martinique, MD  Chief Complaint:    Chief Complaint  Patient presents with   Thoracic Aortic Aneurysm    Surgical consult, CTA Chest 01/20/22    History of Present Illness:     [name] "Risk" is a 71 year old patient referred for surgical evaluation of a 5 cm ascending aortic aneurysm.  He has a history of diabetes, and has lost 20 pounds since being placed on his new diabetes medication.  He previously had a history of hypertension, but has been normotensive for several years.  He denies any chest or back pain.  He does smoke marijuana occasionally but denies any tobacco use.     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   Asthma    Atrial fibrillation ([place]) 09/02/2018   Atrial fibrillation (HCC)    CHF (congestive heart failure) ([place])    Cirrhosis ([place])    Controlled diabetes mellitus type 2 with complications ([place]) 67/59/1638   Coronary artery calcification 06/11/2019   Hyperlipidemia    Hypertension    Pneumonia 2019   Sleep apnea    Staph infection    to elbow 15-75yr ago    Past Surgical History:  Procedure Laterality Date   PALATE / UVULA BIOPSY / EXCISION     trimmed several times      Social History   Tobacco Use  Smoking Status Never  Smokeless Tobacco Current   Types: Snuff    Social History   Substance and Sexual Activity  Alcohol Use Yes   Alcohol/week: 10.0 standard drinks   Types: 10 Shots of liquor per week   Comment: 10 drinks a week     Allergies  Allergen Reactions   Quinolones Other (See Comments)    Current Outpatient Medications  Medication Sig Dispense Refill   Adalimumab 40 MG/0.4ML PNKT Inject 1 each into the  skin. 2 times a months.     albuterol (VENTOLIN HFA) 108 (90 Base) MCG/ACT inhaler INHALE 1 PUFF INTO THE LUNGS EVERY 6 HOURS AS NEEDED FOR WHEEZING OR SHORTNESS OF BREATH 18 g 0   atorvastatin (LIPITOR) 80 MG tablet Take 1 tablet (80 mg total) by mouth daily. 90 tablet 2   carvedilol (COREG) 25 MG tablet TAKE 1 TABLET(25 MG) BY MOUTH TWICE DAILY. 180 tablet 1   ENTRESTO 24-26 MG TAKE 1 TABLET BY MOUTH TWICE DAILY 180 tablet 3   ENTRESTO 24-26 MG TAKE 1 TABLET BY MOUTH TWICE DAILY 180 tablet 3   Fenofibrate 50 MG CAPS TAKE 1 CAPSULE(50 MG) BY MOUTH DAILY 30 capsule 0   glucose blood (ACCU-CHEK AVIVA PLUS) test strip TEST 1 TO 2 TIMES A DAY 100 strip 3   HYDROcodone-acetaminophen (NORCO) 10-325 MG tablet Take 1 tablet by mouth every 6 (six) hours as needed.     insulin glargine (LANTUS SOLOSTAR) 100 UNIT/ML Solostar Pen Inject 15 Units into the skin every morning. 15 mL 5   Insulin Pen Needle (PEN NEEDLES) 32G X 4 MM MISC 1 each by Other route daily. 100 each 2   JARDIANCE 25 MG TABS tablet TAKE 1 TABLET(25 MG) BY MOUTH DAILY  90 tablet 0   metFORMIN (GLUCOPHAGE-XR) 500 MG 24 hr tablet Take 4 tablets (2,000 mg total) by mouth daily with breakfast. 360 tablet 3   montelukast (SINGULAIR) 10 MG tablet TAKE 1 TABLET(10 MG) BY MOUTH DAILY 90 tablet 1   rivaroxaban (XARELTO) 20 MG TABS tablet TAKE 1 TABLET(20 MG) BY MOUTH DAILY WITH SUPPER 30 tablet 5   spironolactone (ALDACTONE) 25 MG tablet TAKE 1 TABLET BY MOUTH EVERY 2 DAYS 45 tablet 0   SYMBICORT 160-4.5 MCG/ACT inhaler Inhale 2 puffs into the lungs in the morning and at bedtime. 10.2 g 6   tirzepatide (MOUNJARO) 2.5 MG/0.5ML Pen Inject 2.5 mg into the skin once a week. 6 mL 3   tirzepatide (MOUNJARO) 5 MG/0.5ML Pen See admin instructions.     Current Facility-Administered Medications  Medication Dose Route Frequency Provider Last Rate Last Admin   0.9 %  sodium chloride infusion  500 mL Intravenous Once Cirigliano, Vito V, DO        (Not in a  hospital admission)   Family History  Problem Relation Age of Onset   Lupus Mother    Hypertension Mother    Diabetes Father    Diabetes Paternal Grandmother    Colon cancer Neg Hx    Stomach cancer Neg Hx    Esophageal cancer Neg Hx    Rectal cancer Neg Hx      Review of Systems:   Review of Systems  Constitutional: Negative.   Respiratory: Negative.    Cardiovascular: Negative.   Musculoskeletal: Negative.      Physical Exam: BP 106/66   Pulse 63   Resp 20   Ht '5\' 9"'$  (1.753 m)   Wt 179 lb (81.2 kg)   SpO2 96% Comment: RA  BMI 26.43 kg/m  Physical Exam Constitutional:      Appearance: He is normal weight.  HENT:     Head: Normocephalic and atraumatic.  Eyes:     Extraocular Movements: Extraocular movements intact.  Cardiovascular:     Rate and Rhythm: Normal rate.  Pulmonary:     Effort: Pulmonary effort is normal.  Abdominal:     General: Abdomen is flat. There is no distension.  Musculoskeletal:        General: Normal range of motion.  Skin:    General: Skin is warm and dry.  Neurological:     General: No focal deficit present.     Mental Status: He is alert and oriented to person, place, and time.       I have independently reviewed the above radiologic studies and discussed with the patient   Recent Lab Findings: Lab Results  Component Value Date   WBC 5.0 06/10/2021   HGB 14.9 06/10/2021   HCT 45.7 06/10/2021   PLT 196 06/10/2021   GLUCOSE 75 06/10/2021   CHOL 121 06/10/2021   TRIG 66 06/10/2021   HDL 51 06/10/2021   LDLCALC 56 06/10/2021   ALT 18 06/10/2021   AST 26 06/10/2021   NA 145 (H) 06/10/2021   K 4.7 06/10/2021   CL 101 06/10/2021   CREATININE 1.20 01/20/2022   BUN 23 06/10/2021   CO2 26 06/10/2021   TSH 1.080 06/10/2021   INR 2.0 (H) 08/17/2017   HGBA1C 7.1 (A) 08/26/2021      Assessment / Plan:   71 year old male with a 5 cm ascending aortic aneurysm.  I personally reviewed his cross-sectional imaging as well as  echocardiogram.  He does have a trileaflet aortic  valve.  We discussed the risk factors for ongoing aneurysmal growth as well as the signs and symptoms for an aortic dissection.  He is aware of when to present to the emergency department.  We will continue annual surveillance with CT scans.  He will follow-up in 1 year.     I  spent 20 minutes counseling the patient face to face.   Lajuana Matte 02/03/2022 3:01 PM

## 2022-02-06 ENCOUNTER — Other Ambulatory Visit: Payer: Self-pay | Admitting: Physician Assistant

## 2022-02-06 ENCOUNTER — Telehealth: Payer: Self-pay | Admitting: Family Medicine

## 2022-02-06 MED ORDER — EMPAGLIFLOZIN 25 MG PO TABS: 25.0000 mg | ORAL_TABLET | Freq: Every day | ORAL | 2 refills | Status: DC

## 2022-02-06 MED ORDER — SPIRONOLACTONE 25 MG PO TABS: 25.0000 mg | ORAL_TABLET | ORAL | 2 refills | Status: DC

## 2022-02-06 MED ORDER — FENOFIBRATE 50 MG PO CAPS: 1.0000 | ORAL_CAPSULE | Freq: Every day | ORAL | 2 refills | Status: DC

## 2022-02-06 NOTE — Telephone Encounter (Signed)
Theodore Bush with [facility] called and states they cannot refill the meds you sent to them on 5/19 for patient.  [facility] is a [employer] and not licensed in [place].  I have removed them from you refill option.  Please resend RX.

## 2022-02-06 NOTE — Assessment & Plan Note (Signed)
Followed by Endocrinology, eat a low sugar diet, avoid starchy food with a lot of carbohydrates, avoid fried and processed foods; last hgb a1c 7.1 on 08/26/2021

## 2022-02-06 NOTE — Telephone Encounter (Signed)
New prescriptions sent to [facility]

## 2022-02-06 NOTE — Assessment & Plan Note (Signed)
controlled, eat a low salt diet, do not add any salt to food when cooking, avoid processed foods, avoid fried foods ? ?

## 2022-02-14 ENCOUNTER — Telehealth: Payer: Self-pay | Admitting: Physician Assistant

## 2022-02-14 ENCOUNTER — Other Ambulatory Visit: Payer: Self-pay

## 2022-02-14 MED ORDER — EMPAGLIFLOZIN 25 MG PO TABS: 25.0000 mg | ORAL_TABLET | Freq: Every day | ORAL | 2 refills | Status: DC

## 2022-02-14 MED ORDER — FENOFIBRATE 50 MG PO CAPS: 1.0000 | ORAL_CAPSULE | Freq: Every day | ORAL | 2 refills | Status: DC

## 2022-02-14 MED ORDER — TRAZODONE HCL 50 MG PO TABS
25.0000 mg | ORAL_TABLET | Freq: Every evening | ORAL | 2 refills | Status: DC | PRN
Start: 2022-02-14 — End: 2022-11-07

## 2022-02-14 MED ORDER — SPIRONOLACTONE 25 MG PO TABS: 25.0000 mg | ORAL_TABLET | ORAL | 2 refills | Status: DC

## 2022-02-14 NOTE — Telephone Encounter (Signed)
Pt called and states that medications recently sent in need to go to a different pharmacy. They need to go to [facility] a mail order pharmacy. Pt states he has enough meds to last until they arrive.

## 2022-02-27 NOTE — Progress Notes (Signed)
Cardiology Office Note   Date:  03/03/2022   ID:  Theodore Bush, DOB [birth year], MRN [id number]  PCP:  Irene Pap, PA-C  Cardiologist:   Michaella Imai Martinique, MD   Chief Complaint  Patient presents with   Atrial Fibrillation      History of Present Illness: Theodore Bush is a 71 y.o. male who is seen for follow up  of CHF and history of Afib.  Patient moved to [facility]  to be closer to family. Moved from [place]. He has a history of atrial fibrillation, CHF and CAD. He reports he has been admitted a couple of times for PNA and at one time had "fluid in the lungs" and required thoracentesis. He has a difficult time remembering when he was hospitalized or any details about prior cardiac work up. States he did have a stress test at some point in the past. We do have records of a chest CT done for a pulmonary nodule that showed severe coronary calcification. No prior history of MI. He has a history of AFib.  Initially on coumadin but later switched to Xarelto. 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 He has a history of DM on insulin, HTN, and HLD. Has CKD stage 3. Has a thoracic aortic aneurysm.   An Echo was obtained here in 2020 showing an EF of 35-40%.Global hypokinesis. Aortic root enlarged to 49 mm. CT chest done showing aneurysm of 4.7 cm. Coronary calcification. Scattered pulmonary nodules. Evidence of cirrhosis. Heart failure therapy has been optimized with Entresto, Jardiance, coreg, and aldactone.    He lost a lot of weight on Mounjaro but states he is nauseated from this.  He did have recent CT showing stable aneurysm 5 cm. Followed by Dr Kipp Brood. Had colonoscopy showing multiple benign polyps. 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 He gets 10K steps a day. Is drinking 3 alcoholic drinks at least 4 days a week. He notes on his watch HR may run a little high in am up in the 90s but typically in the 70s.      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   Asthma    Atrial fibrillation ([place]) 09/02/2018   Atrial fibrillation (H[facility])    CHF (congestive heart failure) ([place])    Chronic kidney disease 06/11/2019   Cirrhosis ([place])    Controlled diabetes mellitus type 2 with complications ([place]) 98/33/8250   Controlled type 2 diabetes mellitus with complication, with long-term current use of insulin ([place]) 09/02/2018   Coronary artery calcification 06/11/2019   Hyperlipidemia    Hypertension    Insomnia 06/11/2019   Pneumonia 2019   Sleep apnea    Staph infection    to elbow 15-4yr ago   Type 2 diabetes mellitus with hyperglycemia, without long-term current use of insulin ([place] 02/03/2022    Past Surgical History:  Procedure Laterality Date   PALATE / UVULA BIOPSY / EXCISION     trimmed several times     Current Outpatient Medications  Medication Sig Dispense Refill   Adalimumab 40 MG/0.4ML PNKT Inject 1 each into the skin. 2 times a months.     albuterol (VENTOLIN HFA) 108 (90 Base) MCG/ACT inhaler INHALE 1 PUFF INTO THE LUNGS EVERY 6 HOURS AS NEEDED FOR WHEEZING OR SHORTNESS OF BREATH 18 g 0   atorvastatin (LIPITOR) 80 MG tablet Take 1 tablet (80 mg total) by mouth  daily. 90 tablet 2   carvedilol (COREG) 25 MG tablet TAKE 1 TABLET(25 MG) BY MOUTH TWICE DAILY. 180 tablet 1   empagliflozin (JARDIANCE) 25 MG TABS tablet Take 1 tablet (25 mg total) by mouth daily. 90 tablet 2   ENTRESTO 24-26 MG TAKE 1 TABLET BY MOUTH TWICE DAILY 180 tablet 3   ENTRESTO 24-26 MG TAKE 1 TABLET BY MOUTH TWICE DAILY 180 tablet 3   Fenofibrate 50 MG CAPS Take 1 capsule (50 mg total) by mouth daily. 90 capsule 2   glucose blood (ACCU-CHEK AVIVA PLUS) test strip TEST 1 TO 2 TIMES A DAY 100 strip 3   HYDROcodone-acetaminophen (NORCO) 10-325 MG tablet Take 1 tablet by mouth every 6 (six) hours as needed.     insulin glargine (LANTUS SOLOSTAR) 100 UNIT/ML Solostar Pen Inject 15 Units into the skin every  morning. 15 mL 5   Insulin Pen Needle (PEN NEEDLES) 32G X 4 MM MISC 1 each by Other route daily. 100 each 2   metFORMIN (GLUCOPHAGE-XR) 500 MG 24 hr tablet Take 4 tablets (2,000 mg total) by mouth daily with breakfast. 360 tablet 3   montelukast (SINGULAIR) 10 MG tablet TAKE 1 TABLET(10 MG) BY MOUTH DAILY 90 tablet 1   rivaroxaban (XARELTO) 20 MG TABS tablet TAKE 1 TABLET(20 MG) BY MOUTH DAILY WITH SUPPER 30 tablet 5   spironolactone (ALDACTONE) 25 MG tablet Take 1 tablet (25 mg total) by mouth every other day. 45 tablet 2   SYMBICORT 160-4.5 MCG/ACT inhaler Inhale 2 puffs into the lungs in the morning and at bedtime. 10.2 g 6   tirzepatide (MOUNJARO) 2.5 MG/0.5ML Pen Inject 2.5 mg into the skin once a week. 6 mL 3   tirzepatide (MOUNJARO) 5 MG/0.5ML Pen See admin instructions.     traZODone (DESYREL) 50 MG tablet Take 0.5-1 tablets (25-50 mg total) by mouth at bedtime as needed for sleep. 90 tablet 2   No current facility-administered medications for this visit.    Allergies:   Quinolones    Social History:  The patient  reports that he has never smoked. His smokeless tobacco use includes snuff. He reports current alcohol use of about 10.0 standard drinks of alcohol per week. He reports current drug use. Drugs: Marijuana and Hydrocodone.   Family History:  The patient's family history includes Diabetes in his father and paternal grandmother; Hypertension in his mother; Lupus in his mother.    ROS:  Please see the history of present illness.   Otherwise, review of systems are positive for none.   All other systems are reviewed and negative.    PHYSICAL EXAM: VS:  BP 124/80   Pulse (!) 119   Ht '5\' 9"'$  (1.753 m)   Wt 178 lb 6.4 oz (80.9 kg)   SpO2 95%   BMI 26.35 kg/m  , BMI Body mass index is 26.35 kg/m. GEN: Well nourished, well developed, in no acute distress  HEENT: normal  Neck: no JVD, carotid bruits, or masses Cardiac: IRRR; no murmurs, rubs, or gallops,no edema   Respiratory:  Few scattered wheezes.  GI: soft, nontender, nondistended, + BS MS: no deformity or atrophy  Skin: warm and dry, no rash Neuro:  Strength and sensation are intact Psych: euthymic mood, full affect   EKG:  EKG is not ordered today.    Recent Labs: 06/10/2021: ALT 18; BUN 23; Hemoglobin 14.9; Platelets 196; Potassium 4.7; Sodium 145; TSH 1.080 01/20/2022: Creatinine, Ser 1.20    Lipid Panel  Component Value Date/Time   CHOL 121 06/10/2021 1345   TRIG 66 06/10/2021 1345   HDL 51 06/10/2021 1345   CHOLHDL 2.4 06/10/2021 1345   LDLCALC 56 06/10/2021 1345      Wt Readings from Last 3 Encounters:  03/03/22 178 lb 6.4 oz (80.9 kg)  02/03/22 180 lb 12.8 oz (82 kg)  02/03/22 179 lb (81.2 kg)      Other studies Reviewed: Additional studies/ records that were reviewed today include:   Echo 06/26/19: IMPRESSIONS      1. Left ventricular ejection fraction, by visual estimation, is 35 to 40%. The left ventricle has moderately decreased function. Normal left ventricular size. There is mildly increased left ventricular hypertrophy. Diffuse hypokinesis.  2. Left ventricular diastolic Doppler parameters are indeterminate pattern of LV diastolic filling. Rhythm uncertain.  3. Trivial pericardial effusion is present.  4. Moderate mitral annular calcification.  5. The mitral valve is normal in structure. Trace mitral valve regurgitation. No evidence of mitral stenosis.  6. The tricuspid valve is normal in structure. Tricuspid valve regurgitation is trivial.  7. The aortic valve is tricuspid Aortic valve regurgitation is mild by color flow Doppler. Mild to moderate aortic valve sclerosis/calcification without any evidence of aortic stenosis.  8. There is severe dilatation of the ascending aorta measuring 49 mm. Suggest MRA or CTA chest to further evaluate.  9. Global right ventricle has mildly reduced systolic function.The right ventricular size is normal. No increase in right  ventricular wall thickness. 10. Left atrial size was moderately dilated. 11. Right atrial size was mildly dilated. 12. The inferior vena cava is normal in size with greater than 50% respiratory variability, suggesting right atrial pressure of 3 mmHg. 13. TR signal is inadequate for assessing pulmonary artery systolic pressure.   Chest CT 07/04/19: IMPRESSION: 1. No acute intrathoracic pathology. No CT evidence of aortic dissection. 2. Dilated ascending aorta measuring up to 4.7 cm in diameter. Ascending thoracic aortic aneurysm. Recommend semi-annual imaging followup by CTA or MRA and referral to cardiothoracic surgery if not already obtained. This recommendation follows 2010 ACCF/AHA/AATS/ACR/ASA/SCA/SCAI/SIR/STS/SVM Guidelines for the Diagnosis and Management of Patients With Thoracic Aortic Disease. Circulation. 2010; 121: C947-S962. Aortic aneurysm NOS (ICD10-I71.9) 3. Cardiomegaly with 3 vessel coronary vascular calcification. 4. Small pericardial effusion. 5. Several small pulmonary nodules measure up to 4 mm. No follow-up needed if patient is low-risk (and has no known or suspected primary neoplasm). Non-contrast chest CT can be considered in 12 months if patient is high-risk. This recommendation follows the consensus statement: Guidelines for Management of Incidental Pulmonary Nodules Detected on CT Images: From the Fleischner Society 2017; Radiology 2017; 284:228-243. 6. Cirrhosis.  CLINICAL DATA:  Follow-up thoracic aortic aneurysm.   EXAM: CT ANGIOGRAPHY CHEST WITH CONTRAST   TECHNIQUE: Multidetector CT imaging of the chest was performed using the standard protocol during bolus administration of intravenous contrast. Multiplanar CT image reconstructions and MIPs were obtained to evaluate the vascular anatomy.   CONTRAST:  184m OMNIPAQUE IOHEXOL 350 MG/ML SOLN   COMPARISON:  Chest CTA 07/04/2019   FINDINGS: Cardiovascular: Aortic root at the sinuses of Valsalva  measures 4.5 cm. Sinotubular junction measures 3.4 cm. Fusiform aneurysm of the ascending thoracic aorta measures up to 4.9 cm on sequence 4, image 57 and this is unchanged from the prior examination. Coronary artery calcifications. Aortic arch measures 3.5 cm and stable. Great vessels are patent. Proximal vertebral arteries are patent. Incidentally, the left vertebral artery originates from the aortic arch near the origin of  the left subclavian artery. Atherosclerotic calcifications involving the thoracic aorta. Descending thoracic aorta measures 3.3 cm and stable. Distal descending thoracic aorta measures 2.9 cm and stable. Negative for aortic dissection. Proximal abdominal aorta is patent with atherosclerotic disease. Variant celiac artery anatomy. There is a common trunk for the left gastric artery and splenic artery. Common hepatic artery originates directly from the abdominal aorta. SMA is widely patent at the origin. Bilateral renal arteries are patent with calcified plaque at the origin but no significant stenosis. Mitral annular calcifications. Small amount of pericardial fluid. Main pulmonary artery is enlarged measuring up to 4.2 cm. Main and central pulmonary arteries are patent.   Mediastinum/Nodes: Thyroid tissue is unremarkable. No enlarged mediastinal or hilar lymph nodes. No enlarged axillary lymph nodes.   Lungs/Pleura: Filling defect along the posterior aspect of the upper trachea and a small linear filling defect extending into the right mainstem bronchus. These endobronchial filling defects likely represent adherent mucus. No significant pleural effusions. Stable tiny calcified granuloma in the anterior right upper lobe on sequence 6 image 59. Stable linear density in the right lower lobe on sequence 6, image 85. Focal volume loss and possible scarring along posterior aspect of the left upper lobe and lingular region. Stable tiny peripheral nodular density in the  left lower lobe on sequence 6, image 116. No significant airspace disease or consolidation in the lungs.   Upper Abdomen: Chronic calcification in the right hepatic lobe. Liver contour is slightly nodular particularly along the anterior left hepatic lobe. No acute abnormality in the upper abdomen.   Musculoskeletal: Degenerative changes in lower cervical spine. Bridging osteophytes in thoracic spine.   Review of the MIP images confirms the above findings.   IMPRESSION: 1. 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 Recommend semi-annual imaging followup by CTA or MRA and referral to cardiothoracic surgery if not already obtained. This recommendation follows 2010 ACCF/AHA/AATS/ACR/ASA/SCA/SCAI/SIR/STS/SVM Guidelines for the Diagnosis and Management of Patients With Thoracic Aortic Disease. Circulation. 2010; 121: Q657-Q469. Aortic aneurysm NOS (ICD10-I71.9) 2. Liver contour is slightly nodular particularly in the anterior left hepatic lobe. Early cirrhotic changes cannot be excluded. 3. Enlarged pulmonary arteries. Findings can be associated with pulmonary hypertension. 4. Aortic Atherosclerosis (ICD10-I70.0). Coronary artery calcifications.     Electronically Signed   By: Markus Daft M.D.   On: 07/08/2020 10:29  ASSESSMENT AND PLAN:  1.  CHF. Chronic systolic. EF 62-95%.  Currently appears  well compensated on aldactone, Coreg, and Entresto. Also on Jardiance. Suspect LV dysfunction may be related to history of Etoh abuse. Recommend complete abstinence from Etoh.  Sodium restriction. will arrange repeat Echo.  2. Atrial fibrillation- persistent. Was in Afib on Ecg in March with HR 61. HR clearly faster today but per his HR monitor rate control has been acceptable. Continue Coreg and Xarelto.  3. CAD. Based on heavy coronary calcification on CT. No active angina. Apparent ischemic work up with stress testing at some point in the past.  4. HLD.  On statin with excellent control 5. DM on insulin per primary care.  6. Asthma. Controlled.  7. CKD stage 3. Last creatinine 1.2  8. HTN well controlled.  9. Thoracic aortic aneurysm. 5.0 cm. Will need yearly CT. Marland Kitchen  Recommend avoidance of fluoroquinolone antibiotics. Followed by Dr Kipp Brood.  10. History of Etoh abuse. Cirrhosis noted on CT. Recommend abstinence.      Current medicines are reviewed at length with the patient today.  The patient  does not have concerns regarding medicines.  The following changes have been made:  no change  Labs/ tests ordered today include:   No orders of the defined types were placed in this encounter.    Disposition:   FU  in 6 months  Signed, Mellany Dinsmore Martinique, MD  03/03/2022 9:10 AM    [facility] [address] [place], [place], [address] Phone [phone], Fax [phone]

## 2022-03-03 ENCOUNTER — Encounter: Payer: Self-pay | Admitting: Cardiology

## 2022-03-03 ENCOUNTER — Ambulatory Visit (INDEPENDENT_AMBULATORY_CARE_PROVIDER_SITE_OTHER): Payer: Medicare Other | Admitting: Cardiology

## 2022-03-03 VITALS — BP 124/80 | HR 119 | Ht 69.0 in | Wt 178.4 lb

## 2022-03-03 DIAGNOSIS — I5022 Chronic systolic (congestive) heart failure: Secondary | ICD-10-CM

## 2022-03-03 DIAGNOSIS — I4819 Other persistent atrial fibrillation: Secondary | ICD-10-CM

## 2022-03-03 DIAGNOSIS — Z7901 Long term (current) use of anticoagulants: Secondary | ICD-10-CM | POA: Diagnosis not present

## 2022-03-03 DIAGNOSIS — I10 Essential (primary) hypertension: Secondary | ICD-10-CM

## 2022-03-03 DIAGNOSIS — I7121 Aneurysm of the ascending aorta, without rupture: Secondary | ICD-10-CM

## 2022-03-03 NOTE — Patient Instructions (Signed)
Medication Instructions:  Your physician recommends that you continue on your current medications as directed. Please refer to the Current Medication list given to you today.  *If you need a refill on your cardiac medications before your next appointment, please call your pharmacy*   Testing/Procedures: Your physician has requested that you have an echocardiogram. Echocardiography is a painless test that uses sound waves to create images of your heart. It provides your doctor with information about the size and shape of your heart and how well your heart's chambers and valves are working. This procedure takes approximately one hour. There are no restrictions for this procedure. This procedure will be done at [address]    Follow-Up: At [facility], you and your health needs are our priority.  As part of our continuing mission to provide you with exceptional heart care, we have created designated Provider Care Teams.  These Care Teams include your primary Cardiologist (physician) and Advanced Practice Providers (APPs -  Physician Assistants and Nurse Practitioners) who all work together to provide you with the care you need, when you need it.  We recommend signing up for the patient portal called "MyChart".  Sign up information is provided on this After Visit Summary.  MyChart is used to connect with patients for Virtual Visits (Telemedicine).  Patients are able to view lab/test results, encounter notes, upcoming appointments, etc.  Non-urgent messages can be sent to your provider as well.   To learn more about what you can do with MyChart, go to [email or web address].    Your next appointment:   6 month(s)  The format for your next appointment:   In Person  Provider:   Peter Martinique, MD

## 2022-03-06 ENCOUNTER — Other Ambulatory Visit: Payer: Self-pay | Admitting: Cardiology

## 2022-03-10 ENCOUNTER — Encounter: Payer: Self-pay | Admitting: Endocrinology

## 2022-03-10 ENCOUNTER — Ambulatory Visit (INDEPENDENT_AMBULATORY_CARE_PROVIDER_SITE_OTHER): Payer: Medicare Other | Admitting: Endocrinology

## 2022-03-10 VITALS — BP 112/78 | HR 80 | Ht 69.0 in | Wt 173.8 lb

## 2022-03-10 DIAGNOSIS — I10 Essential (primary) hypertension: Secondary | ICD-10-CM

## 2022-03-10 DIAGNOSIS — E118 Type 2 diabetes mellitus with unspecified complications: Secondary | ICD-10-CM

## 2022-03-10 DIAGNOSIS — Z794 Long term (current) use of insulin: Secondary | ICD-10-CM

## 2022-03-10 DIAGNOSIS — E1165 Type 2 diabetes mellitus with hyperglycemia: Secondary | ICD-10-CM | POA: Diagnosis not present

## 2022-03-10 DIAGNOSIS — E119 Type 2 diabetes mellitus without complications: Secondary | ICD-10-CM | POA: Diagnosis not present

## 2022-03-10 DIAGNOSIS — E782 Mixed hyperlipidemia: Secondary | ICD-10-CM

## 2022-03-10 LAB — POCT GLYCOSYLATED HEMOGLOBIN (HGB A1C): Hemoglobin A1C: 6.5 % — AB (ref 4.0–5.6)

## 2022-03-10 LAB — MICROALBUMIN / CREATININE URINE RATIO
Creatinine,U: 60.4 mg/dL
Microalb Creat Ratio: 5.2 mg/g (ref 0.0–30.0)
Microalb, Ur: 3.2 mg/dL — ABNORMAL HIGH (ref 0.0–1.9)

## 2022-03-10 NOTE — Progress Notes (Signed)
Patient ID: Theodore Bush, male   DOB: 08/02/1951, 71 y.o.   MRN: [id number]           Reason for Appointment: Type II Diabetes follow-up   History of Present Illness   Diagnosis date: 1995  Previous history:  Oral hypoglycemic drugs previously used are: Metformin, Januvia, linagliptin, Jardiance Jardiance was started in 2018 Insulin was started in 2018 He has mostly been only on Lantus insulin as much as 40 units daily once a day  A1c range in the last few years is: 6.4-8.6  Recent history:     Non-insulin hypoglycemic drugs: Mounjaro 2.5 mg weekly, metformin 2000 mg daily, Jardiance 25 mg daily     Insulin regimen: Lantus 15 units once a day         Side effects from medications: Nausea vomiting from 5 mg Mounjaro  Detailed evaluation of his previous diabetes management, endocrine history, related problems and labs were reviewed and summarized as below  Current self management, blood sugar patterns and problems identified:  A1c is 6.5, previously 7.1 and improving Because of worsening control he has been taking Mounjaro in addition to his basal insulin, Jardiance and metformin Initially was given 5 mg weekly but this caused him to have a lot of nausea and vomiting and he was subsequently given 2.5 mg weekly However even with 2.5 mg dosage he has little desire to eat and is continuing to lose weight He has however cut back on his insulin, previously taking as much as 40 units daily Also in the last week or so he has cut back and stopped his alcohol intake, usually was Consuming 4-5 drinks daily He is concerned about his continuing weight loss   Exercise:  generally active with construction work Diet management: Eating small portions      Monitors blood glucose: Once a day.    Glucometer: One Touch.           Blood Glucose readings from recall:  PRE-MEAL Fasting Lunch Dinner Bedtime Overall  Glucose range: 70-130      Mean/median:        POST-MEAL PC Breakfast PC  Lunch PC Dinner  Glucose range:   ?  Mean/median:        Hypoglycemia:  none                        Dietician visit: Most recent:      Weight control: 285  Wt Readings from Last 3 Encounters:  03/10/22 173 lb 12.8 oz (78.8 kg)  03/03/22 178 lb 6.4 oz (80.9 kg)  02/03/22 180 lb 12.8 oz (82 kg)            Diabetes labs:  Lab Results  Component Value Date   HGBA1C 6.5 (A) 03/10/2022   HGBA1C 7.1 (A) 08/26/2021   HGBA1C 8.6 (H) 06/10/2021   Lab Results  Component Value Date   MICROALBUR 3.2 (H) 03/10/2022   LDLCALC 56 06/10/2021   CREATININE 1.20 01/20/2022     Allergies as of 03/10/2022       Reactions   Quinolones Other (See Comments)        Medication List        Accurate as of March 10, 2022 11:59 PM. If you have any questions, ask your nurse or doctor.          Accu-Chek Aviva Plus test strip Generic drug: glucose blood TEST 1 TO 2 TIMES A DAY   Adalimumab 40  MG/0.4ML Pnkt Inject 1 each into the skin. 2 times a months.   albuterol 108 (90 Base) MCG/ACT inhaler Commonly known as: VENTOLIN HFA INHALE 1 PUFF INTO THE LUNGS EVERY 6 HOURS AS NEEDED FOR WHEEZING OR SHORTNESS OF BREATH   atorvastatin 80 MG tablet Commonly known as: LIPITOR Take 1 tablet (80 mg total) by mouth daily.   carvedilol 25 MG tablet Commonly known as: COREG TAKE 1 TABLET(25 MG) BY MOUTH TWICE DAILY.   empagliflozin 25 MG Tabs tablet Commonly known as: Jardiance Take 1 tablet (25 mg total) by mouth daily.   Entresto 24-26 MG Generic drug: sacubitril-valsartan TAKE 1 TABLET BY MOUTH TWICE DAILY   Entresto 24-26 MG Generic drug: sacubitril-valsartan TAKE 1 TABLET BY MOUTH TWICE DAILY   Fenofibrate 50 MG Caps Take 1 capsule (50 mg total) by mouth daily.   HYDROcodone-acetaminophen 10-325 MG tablet Commonly known as: NORCO Take 1 tablet by mouth every 6 (six) hours as needed.   Lantus SoloStar 100 UNIT/ML Solostar Pen Generic drug: insulin glargine Inject 15  Units into the skin every morning.   metFORMIN 500 MG 24 hr tablet Commonly known as: GLUCOPHAGE-XR Take 4 tablets (2,000 mg total) by mouth daily with breakfast.   montelukast 10 MG tablet Commonly known as: SINGULAIR TAKE 1 TABLET(10 MG) BY MOUTH DAILY   Mounjaro 5 MG/0.5ML Pen Generic drug: tirzepatide See admin instructions. What changed: Another medication with the same name was removed. Continue taking this medication, and follow the directions you see here. Changed by: Reather Littler, MD   Pen Needles 32G X 4 MM Misc 1 each by Other route daily.   rivaroxaban 20 MG Tabs tablet Commonly known as: Xarelto TAKE 1 TABLET(20 MG) BY MOUTH DAILY WITH SUPPER   spironolactone 25 MG tablet Commonly known as: ALDACTONE Take 1 tablet (25 mg total) by mouth every other day.   Symbicort 160-4.5 MCG/ACT inhaler Generic drug: budesonide-formoterol Inhale 2 puffs into the lungs in the morning and at bedtime.   traZODone 50 MG tablet Commonly known as: DESYREL Take 0.5-1 tablets (25-50 mg total) by mouth at bedtime as needed for sleep.        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   Asthma    Atrial fibrillation (HCC) 09/02/2018   Atrial fibrillation (HCC)    CHF (congestive heart failure) (HCC)    Chronic kidney disease 06/11/2019   Cirrhosis (HCC)    Controlled diabetes mellitus type 2 with complications (HCC) 09/02/2018   Controlled type 2 diabetes mellitus with complication, with long-term current use of insulin (HCC) 09/02/2018   Coronary artery calcification 06/11/2019   Hyperlipidemia    Hypertension    Insomnia 06/11/2019   Pneumonia 2019   Sleep apnea    Staph infection    to elbow 15-8yrs ago   Type 2 diabetes mellitus with hyperglycemia, without long-term current use of insulin (HCC) 02/03/2022    Past Surgical History:  Procedure  Laterality Date   PALATE / UVULA BIOPSY / EXCISION     trimmed several times    Family History  Problem Relation Age of Onset   Lupus Mother    Hypertension Mother    Diabetes Father    Diabetes Paternal Grandmother    Colon cancer Neg Hx    Stomach cancer Neg Hx    Esophageal cancer Neg Hx    Rectal cancer  Neg Hx     Social History:  reports that he has never smoked. His smokeless tobacco use includes snuff. He reports current alcohol use of about 10.0 standard drinks of alcohol per week. He reports current drug use. Drugs: Marijuana and Hydrocodone.  Review of Systems:  Last diabetic eye exam date 07/2020  Last foot exam date: 8/21  Symptoms of neuropathy: None  Blood pressure management: Unclear if he has been diagnosed to have hypertension previously Treatment includes Entresto and Jardiance  BP Readings from Last 3 Encounters:  03/10/22 112/78  03/03/22 124/80  02/03/22 110/60    Lipids: Atorvastatin 80 mg prescribed by his cardiologist    Lab Results  Component Value Date   CHOL 121 06/10/2021   CHOL 132 12/11/2019   CHOL 133 06/11/2019   Lab Results  Component Value Date   HDL 51 06/10/2021   HDL 52 12/11/2019   HDL 48 06/11/2019   Lab Results  Component Value Date   LDLCALC 56 06/10/2021   LDLCALC 59 12/11/2019   LDLCALC 65 06/11/2019   Lab Results  Component Value Date   TRIG 66 06/10/2021   TRIG 118 12/11/2019   TRIG 107 06/11/2019   Lab Results  Component Value Date   CHOLHDL 2.4 06/10/2021   CHOLHDL 2.5 12/11/2019   CHOLHDL 2.8 06/11/2019   No results found for: "LDLDIRECT"  Also has been diagnosed with alcoholic cirrhosis of liver  Lab Results  Component Value Date   ALT 18 06/10/2021      Examination:   BP 112/78   Pulse 80   Ht 5\' 9"  (1.753 m)   Wt 173 lb 12.8 oz (78.8 kg)   SpO2 98%   BMI 25.67 kg/m   Body mass index is 25.67 kg/m.    ASSESSMENT/ PLAN:    Diabetes type 2, non obese:   Current regimen:  Mounjaro 2.5 mg weekly, Lantus insulin 15 units daily  A1c is currently 6.5  Blood glucose control is excellent but he is having significant anorexia with even 2.5 mg of Mounjaro along with excessive weight loss However not clear if his blood sugars are controlled at all times especially after meals because of inadequate monitoring  History of hypercholesterolemia managed by cardiology with previously adequate levels  Recommendations:  Trial of Ozempic 0.25 mg weekly instead of Mounjaro He will continue this until his next visit and dose evaluated at that time However if he has again suppression of his appetite or continued weight loss may consider Rybelsus or stopping or reducing Jardiance He will continue his Lantus again and adjust it further based on his morning blood sugars Also need to start checking blood sugars more consistently after meals to see how well these are controlled At this time he can keep using his Accu-Chek but may consider a Dexcom sensor on the next visit if he is not able to check enough and at different times as required Also discussed possibility of needing mealtime insulin if he has any consistently high postprandial readings Needs follow-up lipids from his cardiologist Check urine microalbumin  Patient Instructions  Check blood sugars on waking up 3 days a week  Also check blood sugars about 2 hours after meals and do this after different meals by rotation  Recommended blood sugar levels on waking up are 90-130 and about 2 hours after meal is 130-180  Please bring your blood sugar monitor to each visit, thank you  Start OZEMPIC injections by dialing 0.25 mg on the pen  as shown once weekly on the same day of the week.   You may inject in the sides of the stomach, outer thigh or arm as indicated in the brochure given. If you have any difficulties using the pen see the video at [email or web address]  You will feel fullness of the stomach with starting the  medication and should try to keep the portions at meals small.  You may experience nausea in the first few days which usually gets better over time    If you have any questions or persistent side effects please call the office   You may also talk to a nurse educator with [employer] at [phone] Useful website: [email or web address]      Total visit evaluation for management and counseling = 30 minutes  Reather Littler 03/12/2022, 9:21 PM   Addendum: Urine microalbumin normal

## 2022-03-13 ENCOUNTER — Other Ambulatory Visit: Payer: Self-pay | Admitting: Pulmonary Disease

## 2022-03-17 ENCOUNTER — Encounter: Payer: Self-pay | Admitting: Internal Medicine

## 2022-03-20 ENCOUNTER — Other Ambulatory Visit: Payer: Self-pay | Admitting: Physician Assistant

## 2022-03-20 NOTE — Telephone Encounter (Signed)
Is this ok to refill?  

## 2022-03-24 ENCOUNTER — Other Ambulatory Visit (HOSPITAL_COMMUNITY): Payer: Medicare Other

## 2022-04-07 ENCOUNTER — Ambulatory Visit (HOSPITAL_COMMUNITY): Payer: Medicare Other | Attending: Cardiology

## 2022-04-07 DIAGNOSIS — I10 Essential (primary) hypertension: Secondary | ICD-10-CM | POA: Diagnosis present

## 2022-04-07 DIAGNOSIS — I5022 Chronic systolic (congestive) heart failure: Secondary | ICD-10-CM | POA: Insufficient documentation

## 2022-04-07 DIAGNOSIS — Z7901 Long term (current) use of anticoagulants: Secondary | ICD-10-CM | POA: Insufficient documentation

## 2022-04-07 DIAGNOSIS — I4819 Other persistent atrial fibrillation: Secondary | ICD-10-CM | POA: Diagnosis present

## 2022-04-07 DIAGNOSIS — I7121 Aneurysm of the ascending aorta, without rupture: Secondary | ICD-10-CM | POA: Diagnosis not present

## 2022-04-07 LAB — ECHOCARDIOGRAM COMPLETE
Area-P 1/2: 4.06 cm2
Calc EF: 35.3 %
P 1/2 time: 714 msec
S' Lateral: 4.7 cm
Single Plane A2C EF: 32.7 %
Single Plane A4C EF: 38.5 %

## 2022-04-14 ENCOUNTER — Telehealth: Payer: Self-pay

## 2022-04-14 NOTE — Telephone Encounter (Signed)
Patient called in states he is done with sample ozempic and is tolerating well. Needs a new Rx sent in. Do you want to increase dosage or stay at 0.'5mg'$ ? Please advise

## 2022-04-17 ENCOUNTER — Telehealth: Payer: Self-pay | Admitting: Medical

## 2022-04-17 MED ORDER — ACCU-CHEK GUIDE ME W/DEVICE KIT: PACK | 0 refills | Status: AC

## 2022-04-17 NOTE — Telephone Encounter (Signed)
Pt left message he needs new Accu chek meter itself not the strips

## 2022-04-17 NOTE — Telephone Encounter (Signed)
Pt called and states that his glucose meter broke. He would like one sent in for him. He still has plenty of strips. 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 Please send to [facility] on [address]. Pt can be reached at [phone].

## 2022-04-17 NOTE — Telephone Encounter (Signed)
This was sent already today

## 2022-04-17 NOTE — Telephone Encounter (Signed)
Sent meter to local pharmacy

## 2022-04-19 ENCOUNTER — Other Ambulatory Visit: Payer: Self-pay

## 2022-04-19 DIAGNOSIS — E1165 Type 2 diabetes mellitus with hyperglycemia: Secondary | ICD-10-CM

## 2022-04-19 MED ORDER — SEMAGLUTIDE(0.25 OR 0.5MG/DOS) 2 MG/3ML ~~LOC~~ SOPN: PEN_INJECTOR | SUBCUTANEOUS | 0 refills | Status: DC

## 2022-04-19 NOTE — Telephone Encounter (Signed)
He does have 2 left and is aware Rx sent.

## 2022-04-24 ENCOUNTER — Other Ambulatory Visit: Payer: Self-pay | Admitting: Physician Assistant

## 2022-04-24 ENCOUNTER — Other Ambulatory Visit: Payer: Self-pay

## 2022-04-24 DIAGNOSIS — J4540 Moderate persistent asthma, uncomplicated: Secondary | ICD-10-CM

## 2022-04-24 MED ORDER — ALBUTEROL SULFATE HFA 108 (90 BASE) MCG/ACT IN AERS: INHALATION_SPRAY | RESPIRATORY_TRACT | 0 refills | Status: DC

## 2022-04-24 MED ORDER — ALBUTEROL SULFATE HFA 108 (90 BASE) MCG/ACT IN AERS
INHALATION_SPRAY | RESPIRATORY_TRACT | 0 refills | Status: DC
Start: 2022-04-24 — End: 2022-04-24

## 2022-04-28 ENCOUNTER — Telehealth: Payer: Self-pay | Admitting: Medical

## 2022-04-28 NOTE — Telephone Encounter (Signed)
Switched Ventolin to [employer] by phone

## 2022-05-04 ENCOUNTER — Other Ambulatory Visit: Payer: Self-pay | Admitting: Cardiology

## 2022-05-24 ENCOUNTER — Encounter: Payer: Self-pay | Admitting: Internal Medicine

## 2022-05-27 ENCOUNTER — Other Ambulatory Visit: Payer: Self-pay | Admitting: Cardiology

## 2022-06-01 ENCOUNTER — Ambulatory Visit: Payer: Medicare Other | Admitting: Endocrinology

## 2022-06-02 ENCOUNTER — Other Ambulatory Visit (INDEPENDENT_AMBULATORY_CARE_PROVIDER_SITE_OTHER): Payer: Medicare Other

## 2022-06-02 ENCOUNTER — Ambulatory Visit: Payer: Medicare Other | Admitting: Physician Assistant

## 2022-06-02 DIAGNOSIS — E1165 Type 2 diabetes mellitus with hyperglycemia: Secondary | ICD-10-CM | POA: Diagnosis not present

## 2022-06-02 DIAGNOSIS — E782 Mixed hyperlipidemia: Secondary | ICD-10-CM

## 2022-06-02 DIAGNOSIS — Z794 Long term (current) use of insulin: Secondary | ICD-10-CM | POA: Diagnosis not present

## 2022-06-02 LAB — BASIC METABOLIC PANEL
BUN: 27 mg/dL — ABNORMAL HIGH (ref 6–23)
CO2: 36 mEq/L — ABNORMAL HIGH (ref 19–32)
Calcium: 9.8 mg/dL (ref 8.4–10.5)
Chloride: 100 mEq/L (ref 96–112)
Creatinine, Ser: 1.06 mg/dL (ref 0.40–1.50)
GFR: 71.26 mL/min (ref >60.00)
Glucose, Bld: 83 mg/dL (ref 70–99)
Potassium: 5.3 mEq/L — ABNORMAL HIGH (ref 3.5–5.1)
Sodium: 139 mEq/L (ref 135–145)

## 2022-06-02 LAB — LDL CHOLESTEROL, DIRECT: Direct LDL: 51 mg/dL

## 2022-06-02 LAB — HEMOGLOBIN A1C: Hgb A1c MFr Bld: 6.7 % — ABNORMAL HIGH (ref 4.6–6.5)

## 2022-06-06 ENCOUNTER — Other Ambulatory Visit: Payer: Self-pay | Admitting: Medical

## 2022-06-06 DIAGNOSIS — E118 Type 2 diabetes mellitus with unspecified complications: Secondary | ICD-10-CM

## 2022-06-08 ENCOUNTER — Ambulatory Visit: Payer: Medicare Other | Admitting: Endocrinology

## 2022-06-09 ENCOUNTER — Ambulatory Visit (INDEPENDENT_AMBULATORY_CARE_PROVIDER_SITE_OTHER): Payer: Medicare Other | Admitting: Medical

## 2022-06-09 ENCOUNTER — Encounter: Payer: Self-pay | Admitting: Medical

## 2022-06-09 ENCOUNTER — Ambulatory Visit: Payer: Medicare Other | Admitting: Cardiology

## 2022-06-09 ENCOUNTER — Other Ambulatory Visit: Payer: Self-pay | Admitting: Cardiology

## 2022-06-09 VITALS — BP 120/70 | HR 74 | Ht 69.0 in | Wt 172.0 lb

## 2022-06-09 DIAGNOSIS — J4540 Moderate persistent asthma, uncomplicated: Secondary | ICD-10-CM

## 2022-06-09 DIAGNOSIS — I480 Paroxysmal atrial fibrillation: Secondary | ICD-10-CM

## 2022-06-09 DIAGNOSIS — E1165 Type 2 diabetes mellitus with hyperglycemia: Secondary | ICD-10-CM

## 2022-06-09 DIAGNOSIS — I700 Atherosclerosis of aorta: Secondary | ICD-10-CM

## 2022-06-09 DIAGNOSIS — Q845 Enlarged and hypertrophic nails: Secondary | ICD-10-CM

## 2022-06-09 DIAGNOSIS — I509 Heart failure, unspecified: Secondary | ICD-10-CM

## 2022-06-09 DIAGNOSIS — F5101 Primary insomnia: Secondary | ICD-10-CM

## 2022-06-09 DIAGNOSIS — N1831 Chronic kidney disease, stage 3a: Secondary | ICD-10-CM

## 2022-06-09 DIAGNOSIS — D692 Other nonthrombocytopenic purpura: Secondary | ICD-10-CM

## 2022-06-09 DIAGNOSIS — I2584 Coronary atherosclerosis due to calcified coronary lesion: Secondary | ICD-10-CM

## 2022-06-09 DIAGNOSIS — M069 Rheumatoid arthritis, unspecified: Secondary | ICD-10-CM

## 2022-06-09 DIAGNOSIS — M5136 Other intervertebral disc degeneration, lumbar region: Secondary | ICD-10-CM

## 2022-06-09 DIAGNOSIS — E782 Mixed hyperlipidemia: Secondary | ICD-10-CM

## 2022-06-09 DIAGNOSIS — Z23 Encounter for immunization: Secondary | ICD-10-CM | POA: Diagnosis not present

## 2022-06-09 DIAGNOSIS — M51369 Other intervertebral disc degeneration, lumbar region without mention of lumbar back pain or lower extremity pain: Secondary | ICD-10-CM

## 2022-06-09 DIAGNOSIS — I2510 Atherosclerotic heart disease of native coronary artery without angina pectoris: Secondary | ICD-10-CM

## 2022-06-09 DIAGNOSIS — H9191 Unspecified hearing loss, right ear: Secondary | ICD-10-CM

## 2022-06-09 DIAGNOSIS — I10 Essential (primary) hypertension: Secondary | ICD-10-CM

## 2022-06-09 DIAGNOSIS — L4059 Other psoriatic arthropathy: Secondary | ICD-10-CM

## 2022-06-09 DIAGNOSIS — G4733 Obstructive sleep apnea (adult) (pediatric): Secondary | ICD-10-CM

## 2022-06-09 DIAGNOSIS — Z0000 Encounter for general adult medical examination without abnormal findings: Secondary | ICD-10-CM | POA: Diagnosis not present

## 2022-06-09 DIAGNOSIS — K7030 Alcoholic cirrhosis of liver without ascites: Secondary | ICD-10-CM

## 2022-06-09 DIAGNOSIS — Z794 Long term (current) use of insulin: Secondary | ICD-10-CM

## 2022-06-09 DIAGNOSIS — Z125 Encounter for screening for malignant neoplasm of prostate: Secondary | ICD-10-CM

## 2022-06-09 DIAGNOSIS — Z7901 Long term (current) use of anticoagulants: Secondary | ICD-10-CM

## 2022-06-09 LAB — HM DIABETES EYE EXAM

## 2022-06-09 NOTE — Patient Instructions (Addendum)
This visit was a preventative care visit, also known as wellness visit or routine physical.   Topics typically include healthy lifestyle, diet, exercise, preventative care, vaccinations, sick and well care, proper use of emergency dept and after hours care, as well as other concerns.     Recommendations: Continue to return yearly for your annual wellness and preventative care visits.  This gives [place] a chance to discuss healthy lifestyle, exercise, vaccinations, review your chart record, and perform screenings where appropriate.  I recommend you see your eye doctor yearly for routine vision care.  I recommend you see your dentist yearly for routine dental care including hygiene visits twice yearly.   Vaccination recommendations were reviewed Immunization History  Administered Date(s) Administered   Fluad Quad(high Dose 65+) 06/11/2019, 06/10/2021   Moderna Sars-Covid-2 Vaccination 12/15/2019, 01/15/2020, 08/03/2020   There are several vaccine recommendations  I recommend yearly flu shot, I recommend tetanus and shingles boosters which can be done at your pharmacy.  I recommend the updated COVID-vaccine when it becomes available soon.  There is also a new RSV vaccine to consider at the pharmacy.  And finally I recommend a Prevnar 20 pneumococcal vaccine.  Counseled on the influenza virus vaccine.  Vaccine information sheet given.   High dose Influenza vaccine given after consent obtained.  Counseled on the pneumococcal vaccine.  Vaccine information sheet given.  Pneumococcal vaccine Prevnar 20 given after consent obtained.   Screening for cancer: Colon cancer screening: I reviewed your colonoscopy on file that is up to date from 12/2021  We discussed PSA, prostate exam, and prostate cancer screening risks/benefits.  Generally we stop doing PSA prostate cancer screening at age 44.  He would prefer to still screen.  Skin cancer screening: Check your skin regularly for new changes, growing  lesions, or other lesions of concern Come in for evaluation if you have skin lesions of concern.  Lung cancer screening: If you have a greater than 20 pack year history of tobacco use, then you may qualify for lung cancer screening with a chest CT scan.   Please call your insurance company to inquire about coverage for this test.  We currently don't have screenings for other cancers besides breast, cervical, colon, and lung cancers.  If you have a strong family history of cancer or have other cancer screening concerns, please let me know.    Bone health: Get at least 150 minutes of aerobic exercise weekly Get weight bearing exercise at least once weekly Bone density test:  A bone density test is an imaging test that uses a type of X-ray to measure the amount of calcium and other minerals in your bones. The test may be used to diagnose or screen you for a condition that causes weak or thin bones (osteoporosis), predict your risk for a broken bone (fracture), or determine how well your osteoporosis treatment is working. The bone density test is recommended for females 5 and older, or females or males <74 if certain risk factors such as thyroid disease, long term use of steroids such as for asthma or rheumatological issues, vitamin D deficiency, estrogen deficiency, family history of osteoporosis, self or family h disease seem to be with history of fragility fracture in first degree relative.   Consider a baseline bone density test.  Please call to schedule your bone density test.   [facility] [address], [place], [place] [address]   Heart health: Get at least 150 minutes  of aerobic exercise weekly Limit alcohol It is important to maintain a healthy blood pressure and healthy cholesterol numbers  Heart disease screening: Screening for heart disease includes screening for blood pressure, fasting lipids, glucose/diabetes  screening, BMI height to weight ratio, reviewed of smoking status, physical activity, and diet.    Goals include blood pressure 120/80 or less, maintaining a healthy lipid/cholesterol profile, preventing diabetes or keeping diabetes numbers under good control, not smoking or using tobacco products, exercising most days per week or at least 150 minutes per week of exercise, and eating healthy variety of fruits and vegetables, healthy oils, and avoiding unhealthy food choices like fried food, fast food, high sugar and high cholesterol foods.    Sees cardiology ,reviewed abnormal 03/2022 echocardiogram   Medical care options: I recommend you continue to seek care here first for routine care.  We try really hard to have available appointments Monday through Friday daytime hours for sick visits, acute visits, and physicals.  Urgent care should be used for after hours and weekends for significant issues that cannot wait till the next day.  The emergency department should be used for significant potentially life-threatening emergencies.  The emergency department is expensive, can often have long wait times for less significant concerns, so try to utilize primary care, urgent care, or telemedicine when possible to avoid unnecessary trips to the emergency department.  Virtual visits and telemedicine have been introduced since the pandemic started in 2020, and can be convenient ways to receive medical care.  We offer virtual appointments as well to assist you in a variety of options to seek medical care.   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  On surveillance for now  Rheumatoid arthritis-sees rheumatology regularly, compliant with medications  History of sleep apnea when he was heavier weight years ago.  No issues currently  Primary insomnia-does fine on trazodone  Dyslipidemia-compliant with statin, compliant with fenofibrate.  Reviewed recent labs in the chart record  CKD 3-updated labs today, avoid NSAIDs, hydrate well daily  Hypertrophic nails-referral to podiatry for diabetic foot care  Chronic anticoagulation, paroxysmal A-fib-on therapy, compliant, sees cardiology  History of mild cirrhosis of the liver-I recommend decreasing alcohol intake.  Sees gastroenterology.  Chronic hearing loss right ear-he seems to do fine with the left ear hearing.  Hearing loss right ear - prior consult with audiology.  Does fine with left hearing currently.

## 2022-06-09 NOTE — Progress Notes (Signed)
Subjective:    Theodore Bush is a 71 y.o. male who presents for Preventative Services visit and chronic medical problems/med check visit.    Chief Complaint  Patient presents with   nonfasting cpe    Nonfasting cpe, had cereal at 6am.    Primary Care Provider Tysinger, Camelia Eng, PA-C here for primary care  Current Health Care Team: Dentist, Dr. Aleatha Borer teeth on top Eye doctor, Dr. [place] eye Dr. Leigh Aurora, rheumatology Dr. Elayne Snare, endocrinology Dr. Peter [place], cardiology Dr. Melodie Bouillon, cardiothoracic surgery Dr. Gerrit Heck, GI Dr. Sherrilyn Rist, pulmonology  Medical Services you may have received from other than [facility] providers in the past year (date may be approximate) Endo- Dr. Dwyane Dee Cardiol- Dr. Martinique Olalere- Pulm  Exercise Current exercise habits:  working    Nutrition/Diet Current diet: in general, a "healthy" diet    Depression Screen    06/09/2022    9:52 AM  Depression screen PHQ 2/9  Decreased Interest 0  Down, Depressed, Hopeless 0  PHQ - 2 Score 0    Activities of Daily Living Screen/Functional Status Survey Is the patient deaf or have difficulty hearing?: Yes (deaf in one ear) Does the patient have difficulty seeing, even when wearing glasses/contacts?: No Does the patient have difficulty concentrating, remembering, or making decisions?: No Does the patient have difficulty walking or climbing stairs?: No Does the patient have difficulty dressing or bathing?: No Does the patient have difficulty doing errands alone such as visiting a doctor's office or shopping?: No  Can patient draw a clock face showing 3:15 oclock, yes  Fall Risk Screen    06/09/2022    9:52 AM 09/02/2021    1:44 PM 09/01/2021    7:49 PM 06/10/2021   11:51 AM 05/02/2021   11:01 AM  Fall Risk   Falls in the past year? 0 0 0 0 0  Number falls in past yr: 0  0 0 0  Injury with Fall? 0  0 0 0  Risk for fall due to : No Fall Risks Medication side effect  No  Fall Risks No Fall Risks  Follow up Falls evaluation completed Falls evaluation completed;Education provided;Falls prevention discussed  Falls evaluation completed Falls evaluation completed    Gait Assessment: Normal gait observed yes  Advanced directives Does patient have a [place]? Yes Does patient have a Living Will? no  Past Medical History:  Diagnosis Date   Aortic arch atherosclerosis ([place]) 06/11/2019   Asthma    Atrial fibrillation ([place]) 09/02/2018   Atrial fibrillation (HCC)    CHF (congestive heart failure) ([place])    Chronic kidney disease 06/11/2019   Cirrhosis ([place])    Controlled diabetes mellitus type 2 with complications ([place]) 87/56/4332   Controlled type 2 diabetes mellitus with complication, with long-term current use of insulin ([place]) 09/02/2018   Coronary artery calcification 06/11/2019   Hyperlipidemia    Hypertension    Insomnia 06/11/2019   Pneumonia 2019   Sleep apnea    he was 280lb at the time   Staph infection    to elbow 15-25yrs ago   Type 2 diabetes mellitus with hyperglycemia, without long-term current use of insulin ([place]) 02/03/2022    Past Surgical History:  Procedure Laterality Date   COLONOSCOPY  12/2021   PALATE / UVULA BIOPSY / EXCISION     trimmed several times    Social History   Socioeconomic History   Marital status: Married    Spouse name: Not  on file   Number of children: 2   Years of education: Not on file   Highest education level: Not on file  Occupational History   Occupation: Retired    Comment: Copywriter, advertising  Tobacco Use   Smoking status: Never   Smokeless tobacco: Current    Types: Snuff  Vaping Use   Vaping Use: Never used  Substance and Sexual Activity   Alcohol use: Yes    Alcohol/week: 10.0 standard drinks of alcohol    Types: 10 Shots of liquor per week    Comment: 10 drinks a week   Drug use: Yes    Types: Marijuana, Hydrocodone    Comment: occ, once a week per patient.    Sexual activity: Not on file  Other Topics Concern   Not on file  Social History Narrative   Lives with wife Tammy.   Son and a daughter in [place].   Pipe fitter, still works.   05/2022.   Social Determinants of Health   Financial Resource Strain: Low Risk  (09/02/2021)   Overall Financial Resource Strain (CARDIA)    Difficulty of Paying Living Expenses: Not hard at all  Food Insecurity: No Food Insecurity (09/02/2021)   Hunger Vital Sign    Worried About Running Out of Food in the Last Year: Never true    Ran Out of Food in the Last Year: Never true  Transportation Needs: No Transportation Needs (09/02/2021)   PRAPARE - Hydrologist (Medical): No    Lack of Transportation (Non-Medical): No  Physical Activity: Inactive (09/02/2021)   Exercise Vital Sign    Days of Exercise per Week: 0 days    Minutes of Exercise per Session: 0 min  Stress: No Stress Concern Present (09/02/2021)   [place]    Feeling of Stress : Not at all  Social Connections: Not on file  Intimate Partner Violence: Not on file    Family History  Problem Relation Age of Onset   Lupus Mother    Hypertension Mother    Diabetes Father    Diabetes Paternal Grandmother    Colon cancer Neg Hx    Stomach cancer Neg Hx    Esophageal cancer Neg Hx    Rectal cancer Neg Hx      Current Outpatient Medications:    ACCU-CHEK GUIDE test strip, USE TO TEST 1 TO 2 TIMES PER DAY, Disp: 100 strip, Rfl: 3   Accu-Chek Softclix Lancets lancets, USE TO TEST 1 TO 2 TIMES PER DAY, Disp: 100 each, Rfl: 3   Adalimumab 40 MG/0.4ML PNKT, Inject 1 each into the skin. 2 times a months., Disp: , Rfl:    albuterol (VENTOLIN HFA) 108 (90 Base) MCG/ACT inhaler, INHALE 1 PUFF INTO THE LUNGS EVERY 6 HOURS AS NEEDED FOR WHEEZING OR SHORTNESS OF BREATH Strength: 108 (90 Base) MCG/ACT, Disp: 18 g, Rfl: 0   atorvastatin (LIPITOR) 80 MG tablet,  TAKE 1 TABLET(80 MG) BY MOUTH DAILY, Disp: 60 tablet, Rfl: 3   Blood Glucose Monitoring Suppl (ACCU-CHEK GUIDE ME) w/Device KIT, Test 1-2 times daily. Pt needs accu-chek meter. Dx. Ell.9, Disp: 1 kit, Rfl: 0   carvedilol (COREG) 25 MG tablet, TAKE 1 TABLET(25 MG) BY MOUTH TWICE DAILY., Disp: 180 tablet, Rfl: 1   empagliflozin (JARDIANCE) 25 MG TABS tablet, Take 1 tablet (25 mg total) by mouth daily., Disp: 90 tablet, Rfl: 2   ENTRESTO 24-26 MG, TAKE 1 TABLET  BY MOUTH TWICE DAILY, Disp: 180 tablet, Rfl: 3   ENTRESTO 24-26 MG, TAKE 1 TABLET BY MOUTH TWICE DAILY, Disp: 180 tablet, Rfl: 3   Fenofibrate 50 MG CAPS, Take 1 capsule (50 mg total) by mouth daily., Disp: 90 capsule, Rfl: 2   HYDROcodone-acetaminophen (NORCO) 10-325 MG tablet, Take 1 tablet by mouth every 6 (six) hours as needed., Disp: , Rfl:    insulin glargine (LANTUS SOLOSTAR) 100 UNIT/ML Solostar Pen, Inject 15 Units into the skin every morning., Disp: 15 mL, Rfl: 5   Insulin Pen Needle (PEN NEEDLES) 32G X 4 MM MISC, 1 each by Other route daily., Disp: 100 each, Rfl: 2   metFORMIN (GLUCOPHAGE-XR) 500 MG 24 hr tablet, Take 4 tablets (2,000 mg total) by mouth daily with breakfast., Disp: 360 tablet, Rfl: 3   montelukast (SINGULAIR) 10 MG tablet, TAKE 1 TABLET(10 MG) BY MOUTH DAILY, Disp: 90 tablet, Rfl: 1   rivaroxaban (XARELTO) 20 MG TABS tablet, TAKE 1 TABLET(20 MG) BY MOUTH DAILY WITH SUPPER, Disp: 30 tablet, Rfl: 5   Semaglutide,0.25 or 0.5MG /DOS, 2 MG/3ML SOPN, Inject 0.5mg  into skin weekly, Disp: 3 mL, Rfl: 0   spironolactone (ALDACTONE) 25 MG tablet, Take 1 tablet (25 mg total) by mouth every other day., Disp: 45 tablet, Rfl: 2   SYMBICORT 160-4.5 MCG/ACT inhaler, Inhale 2 puffs into the lungs in the morning and at bedtime., Disp: 10.2 g, Rfl: 6   traZODone (DESYREL) 50 MG tablet, Take 0.5-1 tablets (25-50 mg total) by mouth at bedtime as needed for sleep., Disp: 90 tablet, Rfl: 2  Allergies  Allergen Reactions   Quinolones Other  (See Comments)    History reviewed: allergies, current medications, past family history, past medical history, past social history, past surgical history and problem list   Chronic issues discussed: Sees several specialist.   Compliant with medications  Checks glucose daily, around 100 fasting daily  Does drink 1 or more alcoholic drinks daily.   Acute issues discussed: none    Objective:    BP 120/70   Pulse 74   Ht 5\' 9"  (1.753 m)   Wt 172 lb (78 kg)   BMI 25.40 kg/m   Wt Readings from Last 3 Encounters:  06/09/22 172 lb (78 kg)  03/10/22 173 lb 12.8 oz (78.8 kg)  03/03/22 178 lb 6.4 oz (80.9 kg)    Cognitive Testing  Alert? Yes  Normal Appearance?Yes  Oriented to person? Yes  Place? Yes   Time? Yes  Recall of three objects?  Yes  Can perform simple calculations? Yes  Displays appropriate judgment?Yes  Can read the correct time from a watch face?Yes  General appearance: alert, no distress, WD/WN, white male  Nutritional Status: Inadequate calore intake? no Loss of muscle mass? no Loss of fat beneath skin? no Localized or general edema? no Diminished functional status? no  Other pertinent exam: Gen: wd, wn, nad Skin: scattered pink/red flat patches on anterior lower legs, right upper back from psorarisis, few scattered abrasions small on forearms, and purpura on bilat forearm from senile purpura HEENT: normocephalic, sclerae anicteric, TMs pearly, nares patent, no discharge or erythema, pharynx normal Oral cavity: MMM, no lesions, dentures present Neck: supple, no lymphadenopathy, no thyromegaly, no masses, no bruits Heart: RRR, normal S1, S2, mild 2/6 murmur in upper sternal borders Lungs: CTA bilaterally, no wheezes, rhonchi, or rales Abdomen: +bs, soft, non tender, non distended, no masses, no hepatomegaly, no splenomegaly Musculoskeletal: nontender, no swelling, no obvious deformity Extremities: no edema, no  cyanosis, no clubbing Pulses: 2+  symmetric, upper and lower extremities, normal cap refill Neurological: alert, oriented x 3, CN2-12 intact, strength normal upper extremities and lower extremities, sensation normal throughout, DTRs 2+ throughout, no cerebellar signs, gait normal Psychiatric: normal affect, behavior normal, pleasant  GU/rectal - deferred    Diabetic Foot Exam - Simple   Simple Foot Form Diabetic Foot exam was performed with the following findings: Yes 06/09/2022 10:33 AM  Visual Inspection See comments: Yes Sensation Testing Intact to touch and monofilament testing bilaterally: Yes Pulse Check Posterior Tibialis and Dorsalis pulse intact bilaterally: Yes Comments Hammer toes throughout several toes and thickened hypertrophic toenails bilat      Assessment:   Encounter Diagnoses  Name Primary?   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[place])    OSA (obstructive sleep apnea)    Moderate persistent asthma, unspecified whether complicated    Mixed dyslipidemia    Primary hypertension    Essential hypertension    Degeneration of lumbar intervertebral disc    Coronary artery calcification    Chronic kidney disease, stage 3a (HCC)    Chronic congestive heart failure, unspecified heart failure type (HCC)    Chronic anticoagulation    Paroxysmal atrial fibrillation (HCC)    Aortic arch atherosclerosis (HCC)    Alcoholic cirrhosis of liver without ascites (HCC)    Hearing loss of right ear, unspecified hearing loss type    Need for pneumococcal 20-valent conjugate vaccination    Screening for prostate cancer    Enlarged and hypertrophic nails    Senile purpura ([place])    Needs flu shot       Plan:    This visit was a preventative care visit, also known as wellness visit or routine physical.   Topics typically include healthy lifestyle, diet, exercise, preventative care, vaccinations, sick and well care, proper use of emergency dept and after hours care, as well as other concerns.     Recommendations: Continue to return yearly for your annual wellness and preventative care visits.  This gives [place] a chance to discuss healthy lifestyle, exercise, vaccinations, review your chart record, and perform screenings where appropriate.  I recommend you see your eye doctor yearly for routine vision care.  I recommend you see your dentist yearly for routine dental care including hygiene visits twice yearly.   Vaccination recommendations were reviewed Immunization History  Administered Date(s) Administered   Fluad Quad(high Dose 65+) 06/11/2019, 06/10/2021, 06/09/2022   Moderna Sars-Covid-2 Vaccination 12/15/2019, 01/15/2020, 08/03/2020   PNEUMOCOCCAL CONJUGATE-20 06/09/2022   There are several vaccine recommendations  I recommend yearly flu shot, I recommend tetanus and shingles boosters which can be done at your pharmacy.  I recommend the updated COVID-vaccine when it becomes available soon.  There is also a new RSV vaccine to consider at the pharmacy.  And finally I recommend a Prevnar 20 pneumococcal vaccine.  Counseled on the influenza virus vaccine.  Vaccine information sheet given.   High dose Influenza vaccine given after consent obtained.  Counseled on the pneumococcal vaccine.  Vaccine information sheet given.  Pneumococcal vaccine Prevnar 20 given after consent obtained.   Screening for cancer: Colon cancer screening: I reviewed your colonoscopy on file that  is up to date from 12/2021  We discussed PSA, prostate exam, and prostate cancer screening risks/benefits.  Generally we stop doing PSA prostate cancer screening at age 38.  He would prefer to still  screen.  Skin cancer screening: Check your skin regularly for new changes, growing lesions, or other lesions of concern Come in for evaluation if you have skin lesions of concern.  Lung cancer screening: If you have a greater than 20 pack year history of tobacco use, then you may qualify for lung cancer screening with a chest CT scan.   Please call your insurance company to inquire about coverage for this test.  We currently don't have screenings for other cancers besides breast, cervical, colon, and lung cancers.  If you have a strong family history of cancer or have other cancer screening concerns, please let me know.    Bone health: Get at least 150 minutes of aerobic exercise weekly Get weight bearing exercise at least once weekly Bone density test:  A bone density test is an imaging test that uses a type of X-ray to measure the amount of calcium and other minerals in your bones. The test may be used to diagnose or screen you for a condition that causes weak or thin bones (osteoporosis), predict your risk for a broken bone (fracture), or determine how well your osteoporosis treatment is working. The bone density test is recommended for females 46 and older, or females or males <82 if certain risk factors such as thyroid disease, long term use of steroids such as for asthma or rheumatological issues, vitamin D deficiency, estrogen deficiency, family history of osteoporosis, self or family h disease seem to be with history of fragility fracture in first degree relative.   Consider a baseline bone density test.  Please call to schedule your bone density test.   [facility] [address], [place], [place] [address]   Heart health: Get at least 150 minutes of aerobic exercise weekly Limit alcohol It is important to maintain a healthy blood pressure and healthy cholesterol numbers  Heart disease screening: Screening for heart  disease includes screening for blood pressure, fasting lipids, glucose/diabetes screening, BMI height to weight ratio, reviewed of smoking status, physical activity, and diet.    Goals include blood pressure 120/80 or less, maintaining a healthy lipid/cholesterol profile, preventing diabetes or keeping diabetes numbers under good control, not smoking or using tobacco products, exercising most days per week or at least 150 minutes per week of exercise, and eating healthy variety of fruits and vegetables, healthy oils, and avoiding unhealthy food choices like fried food, fast food, high sugar and high cholesterol foods.    Sees cardiology ,reviewed abnormal 03/2022 echocardiogram   Medical care options: I recommend you continue to seek care here first for routine care.  We try really hard to have available appointments Monday through Friday daytime hours for sick visits, acute visits, and physicals.  Urgent care should be used for after hours and weekends for significant issues that cannot wait till the next day.  The emergency department should be used for significant potentially life-threatening emergencies.  The emergency department is expensive, can often have long wait times for less significant concerns, so try to utilize primary care, urgent care, or telemedicine when possible to avoid unnecessary trips to the emergency department.  Virtual visits and telemedicine have been introduced since the pandemic started in 2020, and can be convenient ways to receive medical  care.  We offer virtual appointments as well to assist you in a variety of options to seek medical care.   Advanced Directives: I recommend you consider completing a [place] and Living Will.   These documents respect your wishes and help alleviate burdens on your loved ones if you were to become terminally ill or be in a position to need those documents enforced.    You can complete Advanced Directives yourself,  have them notarized, then have copies made for our office, for you and for anybody you feel should have them in safe keeping.  Or, you can have an attorney prepare these documents.   If you haven't updated your Last Will and Testament in a while, it may be worthwhile having an attorney prepare these documents together and save on some costs.       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  On surveillance for now  Rheumatoid arthritis-sees rheumatology regularly, compliant with medications  History of sleep apnea when he was heavier weight years ago.  No issues currently  Primary insomnia-does fine on trazodone  Dyslipidemia-compliant with statin, compliant with fenofibrate.  Reviewed recent labs in the chart record  CKD 3-updated labs today, avoid NSAIDs, hydrate well daily  Hypertrophic nails-referral to podiatry for diabetic foot care  Chronic anticoagulation, paroxysmal A-fib-on therapy, compliant, sees cardiology  History of mild cirrhosis of the liver-I recommend decreasing alcohol intake.  Sees gastroenterology.  Chronic hearing loss right ear-he seems to do fine with the left ear hearing.  Hearing loss right ear - prior consult with audiology.  Does fine with left hearing currently.  Ethan was seen today for nonfasting cpe.  Diagnoses and all orders for this visit:  Encounter for health maintenance examination in adult -     DG Bone Density; Future -     Comprehensive metabolic panel -     AFP tumor marker -     CBC with Differential/Platelet -     PT and PTT; Future -     PSA -     PT and  PTT  Type 2 diabetes mellitus with hyperglycemia, with long-term current use of insulin ([place]) -     Ambulatory referral to Podiatry  Medicare annual wellness visit, subsequent  Rheumatoid arthritis, involving unspecified site, unspecified whether rheumatoid factor present ([place]) -     DG Bone Density; Future  Primary insomnia  Polyarticular psoriatic arthritis (HCC) -     DG Bone Density; Future  OSA (obstructive sleep apnea)  Moderate persistent asthma, unspecified whether complicated  Mixed dyslipidemia  Primary hypertension  Essential hypertension  Degeneration of lumbar intervertebral disc  Coronary artery calcification  Chronic kidney disease, stage 3a ([place]) -     DG Bone Density; Future  Chronic congestive heart failure, unspecified heart failure type (HCC)  Chronic anticoagulation  Paroxysmal atrial fibrillation (HCC)  Aortic arch atherosclerosis (HCC)  Alcoholic cirrhosis of liver without ascites (HCC) -     AFP tumor marker -     PT and PTT; Future -     PT and PTT  Hearing loss of right ear, unspecified hearing loss type  Need for pneumococcal 20-valent conjugate vaccination -     Pneumococcal  conjugate vaccine 20-valent  Screening for prostate cancer -     PSA  Enlarged and hypertrophic nails -     Ambulatory referral to Podiatry  Senile purpura ([place])  Needs flu shot -     Flu Vaccine QUAD High Dose(Fluad)    Follow-up pending labs, yearly for physical       Medicare Attestation A preventative services visit was completed today.  During the course of the visit the patient was educated and counseled about appropriate screening and preventive services.  A health risk assessment was established with the patient that included a review of current medications, allergies, social history, family history, medical and preventative health history, biometrics, and preventative screenings to identify potential safety concerns or impairments.  A  personalized plan was printed today for the patient's records and use.   Personalized health advice and education was given today to reduce health risks and promote self management and wellness.  Information regarding end of life planning was discussed today.  Dorothea Ogle, PA-C   06/09/2022

## 2022-06-10 LAB — COMPREHENSIVE METABOLIC PANEL
ALT: 15 IU/L (ref 0–44)
AST: 25 IU/L (ref 0–40)
Albumin/Globulin Ratio: 1.7 (ref 1.2–2.2)
Albumin: 4.3 g/dL (ref 3.9–4.9)
Alkaline Phosphatase: 32 IU/L — ABNORMAL LOW (ref 44–121)
BUN/Creatinine Ratio: 19 (ref 10–24)
BUN: 20 mg/dL (ref 8–27)
Bilirubin Total: 0.7 mg/dL (ref 0.0–1.2)
CO2: 27 mmol/L (ref 20–29)
Calcium: 9.2 mg/dL (ref 8.6–10.2)
Chloride: 98 mmol/L (ref 96–106)
Creatinine, Ser: 1.08 mg/dL (ref 0.76–1.27)
Globulin, Total: 2.5 g/dL (ref 1.5–4.5)
Glucose: 147 mg/dL — ABNORMAL HIGH (ref 70–99)
Potassium: 4.8 mmol/L (ref 3.5–5.2)
Sodium: 139 mmol/L (ref 134–144)
Total Protein: 6.8 g/dL (ref 6.0–8.5)
eGFR: 74 mL/min/{1.73_m2} (ref >59)

## 2022-06-10 LAB — PSA: Prostate Specific Ag, Serum: 0.2 ng/mL (ref 0.0–4.0)

## 2022-06-10 LAB — CBC WITH DIFFERENTIAL/PLATELET
Basophils Absolute: 0 10*3/uL (ref 0.0–0.2)
Basos: 0 %
EOS (ABSOLUTE): 0.2 10*3/uL (ref 0.0–0.4)
Eos: 4 %
Hematocrit: 41.8 % (ref 37.5–51.0)
Hemoglobin: 14.5 g/dL (ref 13.0–17.7)
Immature Grans (Abs): 0 10*3/uL (ref 0.0–0.1)
Immature Granulocytes: 0 %
Lymphocytes Absolute: 2 10*3/uL (ref 0.7–3.1)
Lymphs: 32 %
MCH: 31 pg (ref 26.6–33.0)
MCHC: 34.7 g/dL (ref 31.5–35.7)
MCV: 89 fL (ref 79–97)
Monocytes Absolute: 0.4 10*3/uL (ref 0.1–0.9)
Monocytes: 7 %
Neutrophils Absolute: 3.6 10*3/uL (ref 1.4–7.0)
Neutrophils: 57 %
Platelets: 185 10*3/uL (ref 150–450)
RBC: 4.68 x10E6/uL (ref 4.14–5.80)
RDW: 11.7 % (ref 11.6–15.4)
WBC: 6.2 10*3/uL (ref 3.4–10.8)

## 2022-06-10 LAB — PT AND PTT
INR: 1.7 — ABNORMAL HIGH (ref 0.9–1.2)
Prothrombin Time: 18.2 s — ABNORMAL HIGH (ref 9.1–12.0)
aPTT: 40 s — ABNORMAL HIGH (ref 24–33)

## 2022-06-10 LAB — AFP TUMOR MARKER: AFP, Serum, Tumor Marker: <1.8 ng/mL (ref 0.0–8.4)

## 2022-06-13 ENCOUNTER — Encounter: Payer: Self-pay | Admitting: Endocrinology

## 2022-06-13 ENCOUNTER — Ambulatory Visit (INDEPENDENT_AMBULATORY_CARE_PROVIDER_SITE_OTHER): Payer: Medicare Other | Admitting: Endocrinology

## 2022-06-13 VITALS — BP 144/76 | HR 66 | Ht 69.0 in | Wt 180.6 lb

## 2022-06-13 DIAGNOSIS — E782 Mixed hyperlipidemia: Secondary | ICD-10-CM

## 2022-06-13 DIAGNOSIS — E1165 Type 2 diabetes mellitus with hyperglycemia: Secondary | ICD-10-CM

## 2022-06-13 DIAGNOSIS — Z794 Long term (current) use of insulin: Secondary | ICD-10-CM

## 2022-06-13 NOTE — Patient Instructions (Addendum)
Lantus 12 units, if am <100 go to 10 or 8  Check blood sugars on waking up 3-4  days a week  Also check blood sugars about 2 hours after meals and do this after different meals by rotation  Recommended blood sugar levels on waking up are 90-130 and about 2 hours after meal is 130-160  Please bring your blood sugar monitor to each visit, thank you

## 2022-06-13 NOTE — Progress Notes (Signed)
Patient ID: Theodore Bush, male   DOB: 1951-07-15, 71 y.o.   MRN: [id number]           Reason for Appointment: Type II Diabetes follow-up   History of Present Illness   Diagnosis date: 1995  Previous history:  Oral hypoglycemic drugs previously used are: Metformin, Januvia, linagliptin, Jardiance Jardiance was started in 2018 Insulin was started in 2018 He has mostly been only on Lantus insulin as much as 40 units daily once a day  A1c range in the last few years is: 6.4-8.6  Recent history:     Non-insulin hypoglycemic drugs:  metformin 2000 mg daily, Jardiance 25 mg daily, Ozempic 0.25 mg weekly     Insulin regimen: Lantus 15 units once a day         Side effects from medications: Nausea vomiting from 5 mg Mounjaro  Detailed evaluation of his previous diabetes management, endocrine history, related problems and labs were reviewed and summarized as below  Current self management, blood sugar patterns and problems identified:  A1c is 6.7 He is now taking Ozempic low doses of 0.25 mg instead of Mounjaro Previously with 2.5 Mounjaro he was having significant anorexia and weight loss No nausea with Ozempic and he is able to eat reasonably well His weight is however appears to be coming back up Despite reminders he only checks his blood sugars in the mornings fasting and these are generally fairly good Recent AVERAGE 119 with range 76-145 He is generally if fairly active during the day with some walking and lifting and climbing steps Still has normal renal function with continuing Jardiance and no side effects Has not changed his insulin dose since last visit  Exercise:  generally active with construction work  Diet management: Eating small portions      Monitors blood glucose: Once a day.    Glucometer: One Touch.           Blood Glucose readings from recall:  PRE-MEAL Fasting Lunch Dinner Bedtime Overall  Glucose range: 70-130      Mean/median:        POST-MEAL PC  Breakfast PC Lunch PC Dinner  Glucose range:   ?  Mean/median:        Hypoglycemia:  none                        Dietician visit: Most recent:      Weight control: 285  Wt Readings from Last 3 Encounters:  06/13/22 180 lb 9.6 oz (81.9 kg)  06/09/22 172 lb (78 kg)  03/10/22 173 lb 12.8 oz (78.8 kg)            Diabetes labs:  Lab Results  Component Value Date   HGBA1C 6.7 (H) 06/02/2022   HGBA1C 6.5 (A) 03/10/2022   HGBA1C 7.1 (A) 08/26/2021   Lab Results  Component Value Date   MICROALBUR 3.2 (H) 03/10/2022   LDLCALC 56 06/10/2021   CREATININE 1.08 06/09/2022     Allergies as of 06/13/2022       Reactions   Quinolones Other (See Comments)        Medication List        Accurate as of June 13, 2022  9:33 PM. If you have any questions, ask your nurse or doctor.          Accu-Chek Guide Me w/Device Kit Test 1-2 times daily. Pt needs accu-chek meter. Dx. Ell.9   Accu-Chek Guide test strip Generic drug:  glucose blood USE TO TEST 1 TO 2 TIMES PER DAY   Accu-Chek Softclix Lancets lancets USE TO TEST 1 TO 2 TIMES PER DAY   Adalimumab 40 MG/0.4ML Pnkt Inject 1 each into the skin. 2 times a months.   albuterol 108 (90 Base) MCG/ACT inhaler Commonly known as: VENTOLIN HFA INHALE 1 PUFF INTO THE LUNGS EVERY 6 HOURS AS NEEDED FOR WHEEZING OR SHORTNESS OF BREATH Strength: 108 (90 Base) MCG/ACT   atorvastatin 80 MG tablet Commonly known as: LIPITOR TAKE 1 TABLET(80 MG) BY MOUTH DAILY   carvedilol 25 MG tablet Commonly known as: COREG TAKE ONE BY MOUTH TWICE DAILY   empagliflozin 25 MG Tabs tablet Commonly known as: Jardiance Take 1 tablet (25 mg total) by mouth daily.   Entresto 24-26 MG Generic drug: sacubitril-valsartan TAKE 1 TABLET BY MOUTH TWICE DAILY   Entresto 24-26 MG Generic drug: sacubitril-valsartan TAKE 1 TABLET BY MOUTH TWICE DAILY   Fenofibrate 50 MG Caps Take 1 capsule (50 mg total) by mouth daily.    HYDROcodone-acetaminophen 10-325 MG tablet Commonly known as: NORCO Take 1 tablet by mouth every 6 (six) hours as needed.   Lantus SoloStar 100 UNIT/ML Solostar Pen Generic drug: insulin glargine Inject 15 Units into the skin every morning.   metFORMIN 500 MG 24 hr tablet Commonly known as: GLUCOPHAGE-XR Take 4 tablets (2,000 mg total) by mouth daily with breakfast.   montelukast 10 MG tablet Commonly known as: SINGULAIR TAKE 1 TABLET(10 MG) BY MOUTH DAILY   Pen Needles 32G X 4 MM Misc 1 each by Other route daily.   rivaroxaban 20 MG Tabs tablet Commonly known as: Xarelto TAKE 1 TABLET(20 MG) BY MOUTH DAILY WITH SUPPER   Semaglutide(0.25 or 0.5MG /DOS) 2 MG/3ML Sopn Inject 0.5mg  into skin weekly   spironolactone 25 MG tablet Commonly known as: ALDACTONE Take 1 tablet (25 mg total) by mouth every other day.   Symbicort 160-4.5 MCG/ACT inhaler Generic drug: budesonide-formoterol Inhale 2 puffs into the lungs in the morning and at bedtime.   traZODone 50 MG tablet Commonly known as: DESYREL Take 0.5-1 tablets (25-50 mg total) by mouth at bedtime as needed for sleep.        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) 61/47/0929   Controlled type 2 diabetes mellitus with complication, with long-term current use of insulin ([place]) 09/02/2018   Coronary artery calcification 06/11/2019   Hyperlipidemia    Hypertension    Insomnia 06/11/2019   Pneumonia 2019   Sleep apnea    he was 280lb at the time   Staph infection    to elbow 15-70yrs ago   Type 2 diabetes mellitus with hyperglycemia, without long-term  current use of insulin ([place]) 02/03/2022    Past Surgical History:  Procedure Laterality Date   COLONOSCOPY  12/2021   PALATE / UVULA BIOPSY / EXCISION     trimmed several times    Family History  Problem Relation Age of Onset   Lupus Mother    Hypertension Mother    Diabetes Father    Diabetes Paternal Grandmother    Colon cancer Neg Hx  Stomach cancer Neg Hx    Esophageal cancer Neg Hx    Rectal cancer Neg Hx     Social History:  reports that he has never smoked. His smokeless tobacco use includes snuff. He reports current alcohol use of about 10.0 standard drinks of alcohol per week. He reports current drug use. Drugs: Marijuana and Hydrocodone.  Review of Systems:  Last diabetic eye exam date 07/2020  Last foot exam date: 8/21  Symptoms of neuropathy: None  Blood pressure management: Unclear if he has been diagnosed to have hypertension previously Treatment includes Entresto and Jardiance  BP Readings from Last 3 Encounters:  06/13/22 (!) 144/76  06/09/22 120/70  03/10/22 112/78    Lipids: Treated with atorvastatin 80 mg prescribed by his cardiologist    Lab Results  Component Value Date   CHOL 121 06/10/2021   CHOL 132 12/11/2019   CHOL 133 06/11/2019   Lab Results  Component Value Date   HDL 51 06/10/2021   HDL 52 12/11/2019   HDL 48 06/11/2019   Lab Results  Component Value Date   LDLCALC 56 06/10/2021   LDLCALC 59 12/11/2019   LDLCALC 65 06/11/2019   Lab Results  Component Value Date   TRIG 66 06/10/2021   TRIG 118 12/11/2019   TRIG 107 06/11/2019   Lab Results  Component Value Date   CHOLHDL 2.4 06/10/2021   CHOLHDL 2.5 12/11/2019   CHOLHDL 2.8 06/11/2019   Lab Results  Component Value Date   LDLDIRECT 51.0 06/02/2022    He has been diagnosed with alcoholic cirrhosis of liver   Fibrosis 4 Score = 2.44       Fib-4 interpretation is not validated for people under 68 or over 8 years of age.      Examination:   BP (!)  144/76   Pulse 66   Ht $R'5\' 9"'OA$  (1.753 m)   Wt 180 lb 9.6 oz (81.9 kg)   SpO2 99%   BMI 26.67 kg/m   Body mass index is 26.67 kg/m.    ASSESSMENT/ PLAN:    Diabetes type 2, non obese:   Current regimen: Ozempic 0.25 mg weekly, Lantus insulin 15 units daily, Jardiance 25 mg daily, metformin ER 2 g daily  A1c is currently 6.7 compared to 6.5  Blood sugars are doing fairly well but only checked in the morning Previously A1c may have been slightly lower because of decreased food intake and weight loss   Recommendations:  Continue Ozempic 0.25 mg weekly However does need to check blood sugars after meals more consistently to identify any postprandial hypoglycemia If blood sugars are higher after meals consistently may consider increasing Ozempic  He can try going up to 12 units of Lantus since he has some readings below 100 in the morning  If the blood sugars in the mornings are consistently below 100 then he can reduce the dose to at least 10 if not 8 units and potentially tapering off  Make sure he has some protein with all his meals Continue Jardiance unchanged Needs follow-up lipids from his cardiologist Check urine microalbumin  Hyperlipidemia: Well-controlled with LDL 51 now  Patient Instructions  Lantus 12 units, if am <100 go to 10 or 8  Check blood sugars on waking up 3-4  days a week  Also check blood sugars about 2 hours after meals and do this after different meals by rotation  Recommended blood sugar levels on waking up are 90-130 and about 2 hours after meal is  130-160  Please bring your blood sugar monitor to each visit, thank you    Elayne Snare 06/13/2022, 9:33 PM

## 2022-06-15 ENCOUNTER — Encounter: Payer: Self-pay | Admitting: Internal Medicine

## 2022-06-27 ENCOUNTER — Encounter: Payer: Self-pay | Admitting: Internal Medicine

## 2022-07-07 ENCOUNTER — Ambulatory Visit (INDEPENDENT_AMBULATORY_CARE_PROVIDER_SITE_OTHER): Payer: Medicare Other | Admitting: Podiatry

## 2022-07-07 DIAGNOSIS — D689 Coagulation defect, unspecified: Secondary | ICD-10-CM | POA: Diagnosis not present

## 2022-07-07 DIAGNOSIS — M79609 Pain in unspecified limb: Secondary | ICD-10-CM | POA: Diagnosis not present

## 2022-07-07 DIAGNOSIS — B351 Tinea unguium: Secondary | ICD-10-CM

## 2022-07-07 NOTE — Progress Notes (Signed)
Subjective:   Patient ID: Theodore Bush, male   DOB: 71 y.o.   MRN: [id number]   HPI Patient presents stating he has trouble taking care of his toenails wants them to be looked at and taking care of they become tender for him and hard for him to reach.  Patient does not currently smoke and tries to be active   Review of Systems  All other systems reviewed and are negative.       Objective:  Physical Exam Vitals and nursing note reviewed.  Constitutional:      Appearance: He is well-developed.  Pulmonary:     Effort: Pulmonary effort is normal.  Musculoskeletal:        General: Normal range of motion.  Skin:    General: Skin is warm.  Neurological:     Mental Status: He is alert.     Neurovascular status found to be intact muscle strength was found to be adequate range of motion within normal limits.  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

## 2022-07-14 ENCOUNTER — Other Ambulatory Visit: Payer: Self-pay | Admitting: Pulmonary Disease

## 2022-07-26 ENCOUNTER — Other Ambulatory Visit: Payer: Self-pay | Admitting: Family Medicine

## 2022-07-26 NOTE — Telephone Encounter (Signed)
Is this okay to refill? 

## 2022-08-01 ENCOUNTER — Other Ambulatory Visit: Payer: Self-pay | Admitting: Endocrinology

## 2022-08-01 DIAGNOSIS — E1165 Type 2 diabetes mellitus with hyperglycemia: Secondary | ICD-10-CM

## 2022-08-25 ENCOUNTER — Ambulatory Visit: Payer: Medicare Other | Admitting: Cardiology

## 2022-08-29 ENCOUNTER — Other Ambulatory Visit: Payer: Self-pay | Admitting: Cardiology

## 2022-09-08 ENCOUNTER — Ambulatory Visit: Payer: Medicare Other

## 2022-09-25 ENCOUNTER — Telehealth: Payer: Self-pay | Admitting: Internal Medicine

## 2022-09-25 ENCOUNTER — Other Ambulatory Visit: Payer: Self-pay | Admitting: Medical

## 2022-09-25 MED ORDER — METFORMIN HCL ER 500 MG PO TB24: 2000.0000 mg | ORAL_TABLET | Freq: Every day | ORAL | 0 refills | Status: DC

## 2022-09-25 NOTE — Telephone Encounter (Signed)
I have refilled his metformin for 30 days as he has been out and he is trying to get in touch with his endo

## 2022-10-03 ENCOUNTER — Other Ambulatory Visit: Payer: Self-pay | Admitting: Pulmonary Disease

## 2022-10-06 ENCOUNTER — Other Ambulatory Visit: Payer: Self-pay | Admitting: Medical

## 2022-10-06 ENCOUNTER — Other Ambulatory Visit: Payer: Medicare Other

## 2022-10-06 ENCOUNTER — Telehealth: Payer: Self-pay | Admitting: Medical

## 2022-10-06 ENCOUNTER — Other Ambulatory Visit (INDEPENDENT_AMBULATORY_CARE_PROVIDER_SITE_OTHER): Payer: Medicare Other

## 2022-10-06 DIAGNOSIS — E1165 Type 2 diabetes mellitus with hyperglycemia: Secondary | ICD-10-CM

## 2022-10-06 DIAGNOSIS — Z794 Long term (current) use of insulin: Secondary | ICD-10-CM

## 2022-10-06 LAB — BASIC METABOLIC PANEL
BUN: 25 mg/dL — ABNORMAL HIGH (ref 6–23)
CO2: 29 mEq/L (ref 19–32)
Calcium: 9.6 mg/dL (ref 8.4–10.5)
Chloride: 100 mEq/L (ref 96–112)
Creatinine, Ser: 1.02 mg/dL (ref 0.40–1.50)
GFR: 74.45 mL/min (ref >60.00)
Glucose, Bld: 176 mg/dL — ABNORMAL HIGH (ref 70–99)
Potassium: 4.5 mEq/L (ref 3.5–5.1)
Sodium: 137 mEq/L (ref 135–145)

## 2022-10-06 LAB — HEMOGLOBIN A1C: Hgb A1c MFr Bld: 7.5 % — ABNORMAL HIGH (ref 4.6–6.5)

## 2022-10-06 MED ORDER — MONTELUKAST SODIUM 10 MG PO TABS: 10.0000 mg | ORAL_TABLET | Freq: Every day | ORAL | 0 refills | Status: DC

## 2022-10-06 NOTE — Telephone Encounter (Signed)
Pt left message that he is having a hard time getting thru to his other doctors office, and asked if you could refill his montelukast

## 2022-10-06 NOTE — Telephone Encounter (Signed)
Sent mychart message

## 2022-10-13 ENCOUNTER — Encounter: Payer: Self-pay | Admitting: Endocrinology

## 2022-10-13 ENCOUNTER — Other Ambulatory Visit: Payer: Self-pay | Admitting: Medical

## 2022-10-13 ENCOUNTER — Ambulatory Visit (INDEPENDENT_AMBULATORY_CARE_PROVIDER_SITE_OTHER): Payer: Medicare Other | Admitting: Endocrinology

## 2022-10-13 VITALS — BP 110/62 | HR 66 | Ht 69.0 in | Wt 171.8 lb

## 2022-10-13 DIAGNOSIS — J4540 Moderate persistent asthma, uncomplicated: Secondary | ICD-10-CM

## 2022-10-13 DIAGNOSIS — Z794 Long term (current) use of insulin: Secondary | ICD-10-CM | POA: Diagnosis not present

## 2022-10-13 DIAGNOSIS — E1165 Type 2 diabetes mellitus with hyperglycemia: Secondary | ICD-10-CM

## 2022-10-13 NOTE — Progress Notes (Unsigned)
Patient ID: Theodore Bush, male   DOB: [birth year], 72 y.o.   MRN: [id number]           Reason for Appointment: Type II Diabetes follow-up   History of Present Illness   Diagnosis date: 1995  Previous history:  Oral hypoglycemic drugs previously used are: Metformin, Januvia, linagliptin, Jardiance Jardiance was started in 2018 Insulin was started in 2018 He has mostly been only on Lantus insulin as much as 40 units daily once a day  A1c range in the last few years is: 6.4-8.6  Recent history:     Non-insulin hypoglycemic drugs:  metformin 2000 mg daily, Jardiance 25 mg daily     Insulin regimen: Lantus 15 units once a day         Side effects from medications: Nausea vomiting from 5 mg Mounjaro, weight loss from Ozempic 0.25 mg weekly  Detailed evaluation of his previous diabetes management, endocrine history, related problems and labs were reviewed and summarized as below  Current self management, blood sugar patterns and problems identified:  A1c is 7.5 compared to 6.7 He stopped taking his Ozempic a few weeks ago because he says he was losing too much weight.  It apparently decreased appetite However his blood sugars appear to be overall higher, now averaging 160 compared to 119 previously Recent range 124-221 Again his forgetting to check his sugars after meals and only in the morning His meter could not be downloaded since it has the wrong date and time He seems to be adjusting his Lantus based on his morning sugar but generally taking 15 units With stopping Ozempic he says he has gained back most of the weight he has lost Nonfasting glucose in the lab was 176 in the early afternoon  Exercise: Overall active with construction work  Diet management: Eating small portions      Monitors blood glucose: Once a day.    Glucometer: Accu-Chek, readings as above       Dietician visit: Most recent:    none  Weight control: 285  Wt Readings from Last 3 Encounters:   10/13/22 171 lb 12.8 oz (77.9 kg)  06/13/22 180 lb 9.6 oz (81.9 kg)  06/09/22 172 lb (78 kg)            Diabetes labs:  Lab Results  Component Value Date   HGBA1C 7.5 (H) 10/06/2022   HGBA1C 6.7 (H) 06/02/2022   HGBA1C 6.5 (A) 03/10/2022   Lab Results  Component Value Date   MICROALBUR 3.2 (H) 03/10/2022   LDLCALC 56 06/10/2021   CREATININE 1.02 10/06/2022     Allergies as of 10/13/2022       Reactions   Quinolones Other (See Comments)        Medication List        Accurate as of October 13, 2022 11:59 PM. If you have any questions, ask your nurse or doctor.          Accu-Chek Guide Me w/Device Kit Test 1-2 times daily. Pt needs accu-chek meter. Dx. Ell.9   Accu-Chek Guide test strip Generic drug: glucose blood USE TO TEST 1 TO 2 TIMES PER DAY   Accu-Chek Softclix Lancets lancets USE TO TEST 1 TO 2 TIMES PER DAY   Adalimumab 40 MG/0.4ML Pnkt Inject 1 each into the skin. 2 times a months.   albuterol 108 (90 Base) MCG/ACT inhaler Commonly known as: VENTOLIN HFA INHALE 1 PUFF INTO THE LUNGS EVERY 6 HOURS AS NEEDED FOR WHEEZING OR SHORTNESS  OF BREATH What changed: additional instructions Changed by: Shane Tysinger, PA-C   atorvastatin 80 MG tablet Commonly known as: LIPITOR TAKE 1 TABLET(80 MG) BY MOUTH DAILY   budesonide-formoterol 160-4.5 MCG/ACT inhaler Commonly known as: SYMBICORT INHALE 2 PUFFS INTO THE LUNGS TWICE DAILY   carvedilol 25 MG tablet Commonly known as: COREG TAKE 1 TABLET(25 MG) BY MOUTH TWICE DAILY   empagliflozin 25 MG Tabs tablet Commonly known as: Jardiance Take 1 tablet (25 mg total) by mouth daily.   Entresto 24-26 MG Generic drug: sacubitril-valsartan TAKE 1 TABLET BY MOUTH TWICE DAILY   Entresto 24-26 MG Generic drug: sacubitril-valsartan TAKE 1 TABLET BY MOUTH TWICE DAILY   Fenofibrate 50 MG Caps Take 1 capsule (50 mg total) by mouth daily.   HYDROcodone-acetaminophen 10-325 MG tablet Commonly known as:  NORCO Take 1 tablet by mouth every 6 (six) hours as needed.   Lantus SoloStar 100 UNIT/ML Solostar Pen Generic drug: insulin glargine Inject 15 Units into the skin every morning.   metFORMIN 500 MG 24 hr tablet Commonly known as: GLUCOPHAGE-XR Take 4 tablets (2,000 mg total) by mouth daily with breakfast.   montelukast 10 MG tablet Commonly known as: SINGULAIR Take 1 tablet (10 mg total) by mouth at bedtime.   Ozempic (0.25 or 0.5 MG/DOSE) 2 MG/3ML Sopn Generic drug: Semaglutide(0.25 or 0.'5MG'$ /DOS) INJECT 0.5 MG INTO THE SKIN ONCE WEEKLY   Pen Needles 32G X 4 MM Misc 1 each by Other route daily.   rivaroxaban 20 MG Tabs tablet Commonly known as: Xarelto TAKE 1 TABLET(20 MG) BY MOUTH DAILY WITH SUPPER   spironolactone 25 MG tablet Commonly known as: ALDACTONE Take 1 tablet (25 mg total) by mouth every other day.   traZODone 50 MG tablet Commonly known as: DESYREL Take 0.5-1 tablets (25-50 mg total) by mouth at bedtime as needed for sleep.        Allergies:  Allergies  Allergen Reactions   Quinolones Other (See Comments)    Past Medical History:  Diagnosis Date   Aortic arch atherosclerosis ([place]) 06/11/2019   Asthma    Atrial fibrillation ([place]) 09/02/2018   Atrial fibrillation (H[facility])    CHF (congestive heart failure) (HCC)    Chronic kidney disease 06/11/2019   Cirrhosis ([place])    Controlled diabetes mellitus type 2 with complications ([place]) 03/00/9233   Controlled type 2 diabetes mellitus with complication, with long-term current use of insulin ([place]) 09/02/2018   Coronary artery calcification 06/11/2019   Hyperlipidemia    Hypertension    Insomnia 06/11/2019   Pneumonia 2019   Sleep apnea    he was 280lb at the time   Staph infection    to elbow 15-74yr ago   Type 2 diabetes mellitus with hyperglycemia, without long-term current use of insulin ([place] 02/03/2022    Past Surgical History:  Procedure Laterality Date   COLONOSCOPY  12/2021   PALATE / UVULA  BIOPSY / EXCISION     trimmed several times    Family History  Problem Relation Age of Onset   Lupus Mother    Hypertension Mother    Diabetes Father    Diabetes Paternal Grandmother    Colon cancer Neg Hx    Stomach cancer Neg Hx    Esophageal cancer Neg Hx    Rectal cancer Neg Hx     Social History:  reports that he has never smoked. His smokeless tobacco use includes snuff. He reports current alcohol use of about 10.0 standard drinks of alcohol per week.  He reports current drug use. Drugs: Marijuana and Hydrocodone.  Review of Systems:  Last diabetic eye exam date 07/2020  Last foot exam date: 8/21  Symptoms of neuropathy: None  Blood pressure management: Unclear if he has been diagnosed to have hypertension previously Treatment includes Entresto and Jardiance  BP Readings from Last 3 Encounters:  10/13/22 110/62  06/13/22 (!) 144/76  06/09/22 120/70    Lipids: Treated with atorvastatin 80 mg prescribed by his cardiologist    Lab Results  Component Value Date   CHOL 121 06/10/2021   CHOL 132 12/11/2019   CHOL 133 06/11/2019   Lab Results  Component Value Date   HDL 51 06/10/2021   HDL 52 12/11/2019   HDL 48 06/11/2019   Lab Results  Component Value Date   LDLCALC 56 06/10/2021   LDLCALC 59 12/11/2019   LDLCALC 65 06/11/2019   Lab Results  Component Value Date   TRIG 66 06/10/2021   TRIG 118 12/11/2019   TRIG 107 06/11/2019   Lab Results  Component Value Date   CHOLHDL 2.4 06/10/2021   CHOLHDL 2.5 12/11/2019   CHOLHDL 2.8 06/11/2019   Lab Results  Component Value Date   LDLDIRECT 51.0 06/02/2022    He has been diagnosed with alcoholic cirrhosis of liver   Fibrosis 4 Score = 2.44       Fib-4 interpretation is not validated for people under 73 or over 67 years of age.      Examination:   BP 110/62 (BP Location: Left Arm, Patient Position: Sitting, Cuff Size: Normal)   Pulse 66   Ht '5\' 9"'$  (1.753 m)   Wt 171 lb 12.8 oz (77.9 kg)    SpO2 97%   BMI 25.37 kg/m   Body mass index is 25.37 kg/m.    ASSESSMENT/ PLAN:    Diabetes type 2, non obese:   Current regimen: Lantus insulin 15 units daily, Jardiance 25 mg daily, metformin ER 2 g daily  A1c is currently 7.5  Blood sugars are generally higher and somewhat variable With his stopping Ozempic his blood sugars are likely higher especially after meals No consistent pattern of blood sugars in the mornings when he checks his readings   Recommendations:  He will try to be consistent with his diet as far as carbohydrates and sweets If his morning sugars are consistently over 130 he can go up to 18 units Lantus Meanwhile he will need to start using the continuous glucose monitor and prescription will be sent through [facility] for the [place] [facility]  If he has consistently high readings after dinner will consider adding mealtime insulin also In the meantime continue Jardiance and metformin   Patient Instructions  Diabetic DME Suppliers   [email or web address] [phone] Fax [phone]  [email or web address] [phone] Fax [phone]  [email or web address] [phone] Fax [phone]  [email or web address].com [phone] Fax [phone]  [facility] [email or web address] [phone] Fax [phone]  [email or web address].com [phone] Fax [phone]  [facility] [email or web address] [phone] Fax [phone]  [email or web address] [phone] Fax [email or web address] [phone] option #1 Fax [phone]  [email or web address] [phone] Fax [phone]  [place] [facility] [phone] Fax [phone]  [place] [email or web address] [phone] Fax [phone]    Elayne Snare 10/14/2022, 10:10 AM

## 2022-10-13 NOTE — Patient Instructions (Signed)
Diabetic DME Suppliers   [email or web address] [phone] Fax [phone]  [email or web address] [phone] Fax [phone]  [email or web address] [phone] ext [phone] Fax [phone]  [email or web address] [phone] Fax [phone]  [facility] [email or web address] [phone] Fax [phone]  [email or web address] [phone] Fax [phone]  [facility] [email or web address] [phone] Fax [phone]  [email or web address] [phone] Fax [email or web address] [phone] option #1 Fax [phone]  [email or web address] [phone] Fax [phone]  [place] [facility] [phone] Fax [phone]  [place] [email or web address] [phone] Fax [phone]

## 2022-10-17 ENCOUNTER — Telehealth: Payer: Self-pay | Admitting: Medical

## 2022-10-17 MED ORDER — METFORMIN HCL ER 500 MG PO TB24: 2000.0000 mg | ORAL_TABLET | Freq: Every day | ORAL | 0 refills | Status: DC

## 2022-10-17 NOTE — Telephone Encounter (Signed)
Ronalee Belts requesting refill on metformin to [facility], [place]

## 2022-10-17 NOTE — Telephone Encounter (Signed)
done

## 2022-10-18 ENCOUNTER — Other Ambulatory Visit: Payer: Self-pay | Admitting: Medical

## 2022-10-18 NOTE — Telephone Encounter (Signed)
Sees endo in April

## 2022-10-31 NOTE — Progress Notes (Signed)
Cardiology Office Note   Date:  11/03/2022   ID:  Theodore Bush, DOB [birth year], MRN [id number]  PCP:  Theodore Hurl, PA-C  Cardiologist:   Theodore Hagan Martinique, MD   Chief Complaint  Patient presents with   Congestive Heart Failure   Atrial Fibrillation      History of Present Illness: Theodore Bush is a 72 y.o. male who is seen for follow up  of CHF and history of Afib.  Patient moved to [facility]  to be closer to family. Moved from [place]. He has a history of atrial fibrillation, CHF and CAD. He reports he has been admitted a couple of times for PNA and at one time had "fluid in the lungs" and required thoracentesis. He has a difficult time remembering when he was hospitalized or any details about prior cardiac work up. States he did have a stress test at some point in the past. We do have records of a chest CT done for a pulmonary nodule that showed severe coronary calcification. No prior history of MI. He has a history of AFib.  Initially on coumadin but later switched to Xarelto. 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 He has a history of DM on insulin, HTN, and HLD. Has CKD stage 3. Has a thoracic aortic aneurysm.   An Echo was obtained here in 2020 showing an EF of 35-40%.Global hypokinesis. Aortic root enlarged to 49 mm. CT chest done showing aneurysm of 4.7 cm. Coronary calcification. Scattered pulmonary nodules. Evidence of cirrhosis. Heart failure therapy has been optimized with Entresto, Jardiance, coreg, and aldactone. Repeat Echo in July 2023 showed no change.  On follow up today he is doing well. 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 He gets 10K steps a day. HR has been well controlled. He did have an URI a month ago and still has some cough. No fever. Did not tolerate Ozempic or monjouro. No edema. Weight stable.      Past Medical History:  Diagnosis Date   Aortic arch atherosclerosis ([place])  06/11/2019   Asthma    Atrial fibrillation ([place]) 09/02/2018   Atrial fibrillation (HCC)    CHF (congestive heart failure) ([place])    Chronic kidney disease 06/11/2019   Cirrhosis ([place])    Controlled diabetes mellitus type 2 with complications ([place]) [id number]   Controlled type 2 diabetes mellitus with complication, with long-term current use of insulin ([place]) 09/02/2018   Coronary artery calcification 06/11/2019   Hyperlipidemia    Hypertension    Insomnia 06/11/2019   Pneumonia 2019   Sleep apnea    he was 280lb at the time   Staph infection    to elbow 15-68yr ago   Type 2 diabetes mellitus with hyperglycemia, without long-term current use of insulin ([place] 02/03/2022    Past Surgical History:  Procedure Laterality Date   COLONOSCOPY  12/2021   PALATE / UVULA BIOPSY / EXCISION     trimmed several times     Current Outpatient Medications  Medication Sig Dispense Refill   ACCU-CHEK GUIDE test strip USE TO TEST 1 TO 2 TIMES PER DAY 100 strip 3   Accu-Chek Softclix Lancets lancets USE TO TEST 1 TO 2 TIMES PER DAY 100 each 3   Adalimumab 40 MG/0.4ML PNKT Inject 1 each into the skin. 2 times a months.     albuterol (VENTOLIN HFA) 108 (90 Base) MCG/ACT  inhaler INHALE 1 PUFF INTO THE LUNGS EVERY 6 HOURS AS NEEDED FOR WHEEZING OR SHORTNESS OF BREATH 18 g 0   atorvastatin (LIPITOR) 80 MG tablet TAKE 1 TABLET(80 MG) BY MOUTH DAILY 60 tablet 3   Blood Glucose Monitoring Suppl (ACCU-CHEK GUIDE ME) w/Device KIT Test 1-2 times daily. Pt needs accu-chek meter. Dx. Ell.9 1 kit 0   budesonide-formoterol (SYMBICORT) 160-4.5 MCG/ACT inhaler INHALE 2 PUFFS INTO THE LUNGS TWICE DAILY 10.2 g 6   carvedilol (COREG) 25 MG tablet TAKE 1 TABLET(25 MG) BY MOUTH TWICE DAILY 180 tablet 1   empagliflozin (JARDIANCE) 25 MG TABS tablet Take 1 tablet (25 mg total) by mouth daily. 90 tablet 2   ENTRESTO 24-26 MG TAKE 1 TABLET BY MOUTH TWICE DAILY 180 tablet 3   ENTRESTO 24-26 MG TAKE 1 TABLET BY MOUTH TWICE  DAILY 180 tablet 3   Fenofibrate 50 MG CAPS Take 1 capsule (50 mg total) by mouth daily. 90 capsule 2   HYDROcodone-acetaminophen (NORCO) 10-325 MG tablet Take 1 tablet by mouth every 6 (six) hours as needed.     insulin glargine (LANTUS SOLOSTAR) 100 UNIT/ML Solostar Pen Inject 15 Units into the skin every morning. 15 mL 5   Insulin Pen Needle (PEN NEEDLES) 32G X 4 MM MISC 1 each by Other route daily. 100 each 2   metFORMIN (GLUCOPHAGE-XR) 500 MG 24 hr tablet TAKE 4 TABLETS(2000 MG) BY MOUTH DAILY WITH BREAKFAST 360 tablet 0   montelukast (SINGULAIR) 10 MG tablet Take 1 tablet (10 mg total) by mouth at bedtime. 90 tablet 0   rivaroxaban (XARELTO) 20 MG TABS tablet TAKE 1 TABLET(20 MG) BY MOUTH DAILY WITH SUPPER 30 tablet 5   spironolactone (ALDACTONE) 25 MG tablet Take 1 tablet (25 mg total) by mouth every other day. 45 tablet 2   traZODone (DESYREL) 50 MG tablet Take 0.5-1 tablets (25-50 mg total) by mouth at bedtime as needed for sleep. 90 tablet 2   No current facility-administered medications for this visit.    Allergies:   Quinolones    Social History:  The patient  reports that he has never smoked. His smokeless tobacco use includes snuff. He reports current alcohol use of about 10.0 standard drinks of alcohol per week. He reports current drug use. Drugs: Marijuana and Hydrocodone.   Family History:  The patient's family history includes Diabetes in his father and paternal grandmother; Hypertension in his mother; Lupus in his mother.    ROS:  Please see the history of present illness.   Otherwise, review of systems are positive for none.   All other systems are reviewed and negative.    PHYSICAL EXAM: VS:  BP 136/76   Pulse 66   Ht 5' 9"$  (1.753 m)   Wt 177 lb 3.2 oz (80.4 kg)   SpO2 99%   BMI 26.17 kg/m  , BMI Body mass index is 26.17 kg/m. GEN: Well nourished, well developed, in no acute distress  HEENT: normal  Neck: no JVD, carotid bruits, or masses Cardiac: IRRR; no  murmurs, rubs, or gallops,no edema  Respiratory:  Few scattered wheezes.  GI: soft, nontender, nondistended, + BS MS: no deformity or atrophy  Skin: warm and dry, no rash Neuro:  Strength and sensation are intact Psych: euthymic mood, full affect   EKG:  EKG is not ordered today.    Recent Labs: 06/09/2022: ALT 15; Hemoglobin 14.5; Platelets 185 10/06/2022: BUN 25; Creatinine, Ser 1.02; Potassium 4.5; Sodium 137    Lipid Panel  Component Value Date/Time   CHOL 121 06/10/2021 1345   TRIG 66 06/10/2021 1345   HDL 51 06/10/2021 1345   CHOLHDL 2.4 06/10/2021 1345   LDLCALC 56 06/10/2021 1345   LDLDIRECT 51.0 06/02/2022 1052      Wt Readings from Last 3 Encounters:  11/03/22 177 lb 3.2 oz (80.4 kg)  10/13/22 171 lb 12.8 oz (77.9 kg)  06/13/22 180 lb 9.6 oz (81.9 kg)      Other studies Reviewed: Additional studies/ records that were reviewed today include:   Echo 06/26/19: IMPRESSIONS      1. Left ventricular ejection fraction, by visual estimation, is 35 to 40%. The left ventricle has moderately decreased function. Normal left ventricular size. There is mildly increased left ventricular hypertrophy. Diffuse hypokinesis.  2. Left ventricular diastolic Doppler parameters are indeterminate pattern of LV diastolic filling. Rhythm uncertain.  3. Trivial pericardial effusion is present.  4. Moderate mitral annular calcification.  5. The mitral valve is normal in structure. Trace mitral valve regurgitation. No evidence of mitral stenosis.  6. The tricuspid valve is normal in structure. Tricuspid valve regurgitation is trivial.  7. The aortic valve is tricuspid Aortic valve regurgitation is mild by color flow Doppler. Mild to moderate aortic valve sclerosis/calcification without any evidence of aortic stenosis.  8. There is severe dilatation of the ascending aorta measuring 49 mm. Suggest MRA or CTA chest to further evaluate.  9. Global right ventricle has mildly reduced systolic  function.The right ventricular size is normal. No increase in right ventricular wall thickness. 10. Left atrial size was moderately dilated. 11. Right atrial size was mildly dilated. 12. The inferior vena cava is normal in size with greater than 50% respiratory variability, suggesting right atrial pressure of 3 mmHg. 13. TR signal is inadequate for assessing pulmonary artery systolic pressure.   Chest CT 07/04/19: IMPRESSION: 1. No acute intrathoracic pathology. No CT evidence of aortic dissection. 2. Dilated ascending aorta measuring up to 4.7 cm in diameter. Ascending thoracic aortic aneurysm. Recommend semi-annual imaging followup by CTA or MRA and referral to cardiothoracic surgery if not already obtained. This recommendation follows 2010 ACCF/AHA/AATS/ACR/ASA/SCA/SCAI/SIR/STS/SVM Guidelines for the Diagnosis and Management of Patients With Thoracic Aortic Disease. Circulation. 2010; [id number]. Aortic aneurysm NOS (ICD10-I71.9) 3. Cardiomegaly with 3 vessel coronary vascular calcification. 4. Small pericardial effusion. 5. Several small pulmonary nodules measure up to 4 mm. No follow-up needed if patient is low-risk (and has no known or suspected primary neoplasm). Non-contrast chest CT can be considered in 12 months if patient is high-risk. This recommendation follows the consensus statement: Guidelines for Management of Incidental Pulmonary Nodules Detected on CT Images: From the Fleischner Society 2017; Radiology 2017; 284:228-243. 6. Cirrhosis.  CLINICAL DATA:  Follow-up thoracic aortic aneurysm.   EXAM: CT ANGIOGRAPHY CHEST WITH CONTRAST   TECHNIQUE: Multidetector CT imaging of the chest was performed using the standard protocol during bolus administration of intravenous contrast. Multiplanar CT image reconstructions and MIPs were obtained to evaluate the vascular anatomy.   CONTRAST:  161m OMNIPAQUE IOHEXOL 350 MG/ML SOLN   COMPARISON:  Chest CTA 07/04/2019    FINDINGS: Cardiovascular: Aortic root at the sinuses of Valsalva measures 4.5 cm. Sinotubular junction measures 3.4 cm. Fusiform aneurysm of the ascending thoracic aorta measures up to 4.9 cm on sequence 4, image 57 and this is unchanged from the prior examination. Coronary artery calcifications. Aortic arch measures 3.5 cm and stable. Great vessels are patent. Proximal vertebral arteries are patent. Incidentally, the left vertebral artery originates  from the aortic arch near the origin of the left subclavian artery. Atherosclerotic calcifications involving the thoracic aorta. Descending thoracic aorta measures 3.3 cm and stable. Distal descending thoracic aorta measures 2.9 cm and stable. Negative for aortic dissection. Proximal abdominal aorta is patent with atherosclerotic disease. Variant celiac artery anatomy. There is a common trunk for the left gastric artery and splenic artery. Common hepatic artery originates directly from the abdominal aorta. SMA is widely patent at the origin. Bilateral renal arteries are patent with calcified plaque at the origin but no significant stenosis. Mitral annular calcifications. Small amount of pericardial fluid. Main pulmonary artery is enlarged measuring up to 4.2 cm. Main and central pulmonary arteries are patent.   Mediastinum/Nodes: Thyroid tissue is unremarkable. No enlarged mediastinal or hilar lymph nodes. No enlarged axillary lymph nodes.   Lungs/Pleura: Filling defect along the posterior aspect of the upper trachea and a small linear filling defect extending into the right mainstem bronchus. These endobronchial filling defects likely represent adherent mucus. No significant pleural effusions. Stable tiny calcified granuloma in the anterior right upper lobe on sequence 6 image 59. Stable linear density in the right lower lobe on sequence 6, image 85. Focal volume loss and possible scarring along posterior aspect of the left upper lobe  and lingular region. Stable tiny peripheral nodular density in the left lower lobe on sequence 6, image 116. No significant airspace disease or consolidation in the lungs.   Upper Abdomen: Chronic calcification in the right hepatic lobe. Liver contour is slightly nodular particularly along the anterior left hepatic lobe. No acute abnormality in the upper abdomen.   Musculoskeletal: Degenerative changes in lower cervical spine. Bridging osteophytes in thoracic spine.   Review of the MIP images confirms the above findings.   IMPRESSION: 1. 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 Recommend semi-annual imaging followup by CTA or MRA and referral to cardiothoracic surgery if not already obtained. This recommendation follows 2010 ACCF/AHA/AATS/ACR/ASA/SCA/SCAI/SIR/STS/SVM Guidelines for the Diagnosis and Management of Patients With Thoracic Aortic Disease. Circulation. 2010; [id number]. Aortic aneurysm NOS (ICD10-I71.9) 2. Liver contour is slightly nodular particularly in the anterior left hepatic lobe. Early cirrhotic changes cannot be excluded. 3. Enlarged pulmonary arteries. Findings can be associated with pulmonary hypertension. 4. Aortic Atherosclerosis (ICD10-I70.0). Coronary artery calcifications.     Electronically Signed   By: Markus Daft M.D.   On: 07/08/2020 10:29  Echo 04/07/22: IMPRESSIONS     1. Left ventricular ejection fraction, by estimation, is 35 to 40%. Left  ventricular ejection fraction by 3D volume is 36 %. The left ventricle has  moderately decreased function. The left ventricle demonstrates global  hypokinesis. The left ventricular  internal cavity size was mildly dilated. There is mild concentric left  ventricular hypertrophy. Left ventricular diastolic parameters are  consistent with Grade II diastolic dysfunction (pseudonormalization).   2. Right ventricular systolic function is normal. The right ventricular   size is normal. There is mildly elevated pulmonary artery systolic  pressure. The estimated right ventricular systolic pressure is [id number] mmHg.   3. Left atrial size was severely dilated.   4. Right atrial size was mild to moderately dilated.   5. A small pericardial effusion is present. The pericardial effusion is  circumferential. There is no evidence of cardiac tamponade.   6. The mitral valve is grossly normal. Mild to moderate mitral valve  regurgitation. The mean mitral valve gradient is 3.2 mmHg with average  heart rate of 60 bpm.  Moderate mitral annular calcification.   7. Tricuspid valve regurgitation is mild to moderate.   8. The aortic valve is tricuspid. Aortic valve regurgitation is mild to  moderate. Aortic valve sclerosis is present, with no evidence of aortic  valve stenosis.   9. Pulmonic valve regurgitation is moderate.  10. Aortic dilatation noted. There is mild dilatation of the aortic root,  measuring 43 mm. There is severe dilatation of the ascending aorta,  measuring 51 mm.  11. The inferior vena cava is dilated in size with >50% respiratory  variability, suggesting right atrial pressure of 8 mmHg.   Comparison(s): Further dilation of the ascending aorta.   Conclusion(s)/Recommendation(s): Has recent CT Aorta that correlates with  similar aortic size.   ASSESSMENT AND PLAN:  1.  CHF. Chronic systolic. EF [id number].  he is   well compensated on aldactone, Coreg, and Entresto. Also on Jardiance. Suspect LV dysfunction may be related to history of Etoh abuse. Recommend complete abstinence from Etoh.  Sodium restriction.  2. Atrial fibrillation- persistent. HR well controlled.  Continue Coreg and Xarelto.  3. CAD. Based on heavy coronary calcification on CT. No active angina. Apparent ischemic work up with stress testing at some point in the past.  4. HLD. On statin with excellent control 5. DM on insulin per primary care. On metformin and Jardiance.  6. Asthma/COPD.  on inhalers 7. CKD stage 3. Last creatinine 1.2  8. HTN well controlled.  9. Thoracic aortic aneurysm. 5.0 cm. Will need yearly CT. Marland Kitchen  Recommend avoidance of fluoroquinolone antibiotics. Followed by Dr Kipp Brood.  10. History of Etoh abuse. Cirrhosis noted on CT. Recommend abstinence.      Current medicines are reviewed at length with the patient today.  The patient does not have concerns regarding medicines.  The following changes have been made:  no change  Labs/ tests ordered today include:   No orders of the defined types were placed in this encounter.    Disposition:   FU  in 6 months  Signed, Audriella Blakeley Martinique, MD  11/03/2022 8:17 AM    [facility] [address], [place], [place], [address] Phone [phone], Fax [phone]

## 2022-11-03 ENCOUNTER — Encounter: Payer: Self-pay | Admitting: Cardiology

## 2022-11-03 ENCOUNTER — Ambulatory Visit: Payer: Medicare Other | Attending: Cardiology | Admitting: Cardiology

## 2022-11-03 VITALS — BP 136/76 | HR 66 | Ht 69.0 in | Wt 177.2 lb

## 2022-11-03 DIAGNOSIS — I10 Essential (primary) hypertension: Secondary | ICD-10-CM

## 2022-11-03 DIAGNOSIS — I5022 Chronic systolic (congestive) heart failure: Secondary | ICD-10-CM | POA: Diagnosis not present

## 2022-11-03 DIAGNOSIS — Z7901 Long term (current) use of anticoagulants: Secondary | ICD-10-CM | POA: Diagnosis not present

## 2022-11-03 DIAGNOSIS — I7121 Aneurysm of the ascending aorta, without rupture: Secondary | ICD-10-CM | POA: Diagnosis not present

## 2022-11-03 DIAGNOSIS — I4819 Other persistent atrial fibrillation: Secondary | ICD-10-CM | POA: Diagnosis not present

## 2022-11-03 NOTE — Patient Instructions (Signed)
Medication Instructions:  Your physician recommends that you continue on your current medications as directed. Please refer to the Current Medication list given to you today.  *If you need a refill on your cardiac medications before your next appointment, please call your pharmacy*  Follow-Up: At [facility], you and your health needs are our priority.  As part of our continuing mission to provide you with exceptional heart care, we have created designated Provider Care Teams.  These Care Teams include your primary Cardiologist (physician) and Advanced Practice Providers (APPs -  Physician Assistants and Nurse Practitioners) who all work together to provide you with the care you need, when you need it.  We recommend signing up for the patient portal called "MyChart".  Sign up information is provided on this After Visit Summary.  MyChart is used to connect with patients for Virtual Visits (Telemedicine).  Patients are able to view lab/test results, encounter notes, upcoming appointments, etc.  Non-urgent messages can be sent to your provider as well.   To learn more about what you can do with MyChart, go to [email or web address].    Your next appointment:   6 month(s)  Provider:   Peter Martinique, MD

## 2022-11-06 ENCOUNTER — Other Ambulatory Visit: Payer: Self-pay | Admitting: Physician Assistant

## 2022-11-06 NOTE — Telephone Encounter (Signed)
Refill request last apt 06/09/22 next apt 12/22/22

## 2022-11-17 ENCOUNTER — Other Ambulatory Visit: Payer: Self-pay | Admitting: Medical

## 2022-11-17 DIAGNOSIS — J4540 Moderate persistent asthma, uncomplicated: Secondary | ICD-10-CM

## 2022-11-17 NOTE — Telephone Encounter (Signed)
Refill request last apt 06/09/22 next apt 12/22/22.

## 2022-12-01 ENCOUNTER — Encounter: Payer: Self-pay | Admitting: Dietician

## 2022-12-01 ENCOUNTER — Telehealth: Payer: Self-pay

## 2022-12-01 ENCOUNTER — Encounter: Payer: Medicare Other | Attending: Endocrinology | Admitting: Dietician

## 2022-12-01 ENCOUNTER — Other Ambulatory Visit: Payer: Self-pay

## 2022-12-01 VITALS — Ht 70.0 in | Wt 175.0 lb

## 2022-12-01 DIAGNOSIS — Z794 Long term (current) use of insulin: Secondary | ICD-10-CM | POA: Insufficient documentation

## 2022-12-01 DIAGNOSIS — Z713 Dietary counseling and surveillance: Secondary | ICD-10-CM | POA: Diagnosis not present

## 2022-12-01 DIAGNOSIS — Z6825 Body mass index (BMI) 25.0-25.9, adult: Secondary | ICD-10-CM | POA: Insufficient documentation

## 2022-12-01 DIAGNOSIS — E1165 Type 2 diabetes mellitus with hyperglycemia: Secondary | ICD-10-CM | POA: Insufficient documentation

## 2022-12-01 MED ORDER — LANTUS SOLOSTAR 100 UNIT/ML ~~LOC~~ SOPN: 15.0000 [IU] | PEN_INJECTOR | SUBCUTANEOUS | 5 refills | Status: DC

## 2022-12-01 NOTE — Telephone Encounter (Signed)
-----   Message from Clydell Hakim, RD sent at 12/01/2022 10:38 AM EDT ----- Regarding: prescription request Patient states that he needs a prescription for Lantus sent to [facility] on [address] ([address]).  He also does not know if he has a prescription for the FreeStyle Libre 3.  Instructed him to call the DME company that shipped them.  He has about 3 sensors left.  I think this may be all he needs to do currently.  Thanks, Mickel Baas

## 2022-12-01 NOTE — Patient Instructions (Addendum)
Consider getting your Vitamin B-12 rechecked.  Consider using your phone for the [place] next time you change the sensor. Be mindful about your snack choices and portion sizes of snack foods.  Plan:  Aim for 3-4 Carb Choices per meal (45-60 grams) +/- 1 either way  Aim for 0-1 Carbs per snack if hungry  Include protein in moderation with your meals and snacks Consider reading food labels for Total Carbohydrate of foods Consider  increasing your activity level by walking for at least 30 minutes daily as tolerated Consistent CGM use Continue taking medication as directed by MD

## 2022-12-01 NOTE — Telephone Encounter (Signed)
RX for Lantus sent to preferred pharmacy. Last order for his Freestyle Elenor Legato was delivered via [facility] on 10/19/22. May be too early to reorder since the patient has 3 sensors left but if he does need more we can resend if eligible for early reorder  [place] contact info: [phone]

## 2022-12-01 NOTE — Progress Notes (Signed)
Diabetes Self-Management Education  Visit Type: First/Initial  Appt. Start Time: 1030 Appt. End Time: 1130  12/01/2022  [name], identified by name and date of birth, is a 72 y.o. male with a diagnosis of Diabetes: Type 2.   ASSESSMENT Patient is here today alone.  Referral reason:  Uncontrolled Type 2 Diabetes with long term current use of insulin.  Started using the [employer] and likes this.  Showed him how he can use his phone rather than the receiver.   Last A1C was increased he stated due to Christmas, stopping Ozempic due to poor appetite and losing too much weight, and flu symptoms for about 1 month. Fasting 133 this am, average blood glucose 178 x 14 days, high of 300.  History includes:  Type 2 diabetes (1995 and insulin since 2018), OSA (improved after weight loss - no C-pap), HLD, HTN, CKD, CHF, Cirrhosis, asthma, A-fib Medications include:  Jardiance, Metformin XR, Lantus 20 units q am, spironolactone Labs noted to include:  A1C 7.5% increased from 6.7% 06/02/2022. GFR 74 10/06/2022, Vitamin B-12 321 06/11/2019 CGM:  FreeStyle Libre 3  70" 175 lbs 165 lbs (lost on GLP-1 drugs but stopped as he did not tolerate it) - did not feel well at this weight. "Lost muscle" 280 lbs highest adult weight Goal weight per patient about 180 lbs "want to gain muscle"  Patient lives with his wife.  She does most of the shopping and cooking.  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 Gets 10,000 steps per day on work days. Has weights but does not use them.  Mows his own lawn.  Has lived locally for 3 years to be near family. 2 vodka per day.  Height 5\' 10"  (1.778 m), weight 175 lb (79.4 kg). Body mass index is 25.11 kg/m.   Diabetes Self-Management Education - 12/01/22 1045       Visit Information   Visit Type First/Initial      Initial Visit   Diabetes Type Type 2    Date Diagnosed 1995    Are you currently following a meal plan? No    Are you taking your  medications as prescribed? Yes      Health Coping   How would you rate your overall health? Fair      Psychosocial Assessment   Patient Belief/Attitude about Diabetes Motivated to manage diabetes    What is the hardest part about your diabetes right now, causing you the most concern, or is the most worrisome to you about your diabetes?   Making healty food and beverage choices    Self-care barriers None    Self-management support Doctor's office    Other persons present Patient    Patient Concerns Nutrition/Meal planning;Glycemic Control;Monitoring    Special Needs None    Preferred Learning Style No preference indicated    Learning Readiness Not Ready    How often do you need to have someone help you when you read instructions, pamphlets, or other written materials from your doctor or pharmacy? 1 - Never    What is the last grade level you completed in school? trade school      Pre-Education Assessment   Patient understands the diabetes disease and treatment process. Needs Review    Patient understands incorporating nutritional management into lifestyle. Needs Review    Patient undertands incorporating physical activity into lifestyle. Needs Review    Patient understands using medications safely. Needs Review    Patient understands monitoring  blood glucose, interpreting and using results Needs Review    Patient understands prevention, detection, and treatment of acute complications. Needs Review    Patient understands prevention, detection, and treatment of chronic complications. Needs Review    Patient understands how to develop strategies to address psychosocial issues. Needs Review    Patient understands how to develop strategies to promote health/change behavior. Needs Review      Complications   Last HgB A1C per patient/outside source 7.5 %   09/2022 increased from 6.7% 06/03/2023   How often do you check your blood sugar? > 4 times/day    Fasting Blood glucose range (mg/dL)  130-179;70-129    Postprandial Blood glucose range (mg/dL) >200;180-200    Number of hypoglycemic episodes per month 1    Can you tell when your blood sugar is low? No    Number of hyperglycemic episodes ( >200mg /dL): Daily    Can you tell when your blood sugar is high? Yes    What do you do if your blood sugar is high? not eat sugar    Have you had a dilated eye exam in the past 12 months? Yes    Have you had a dental exam in the past 12 months? Yes    Are you checking your feet? No      Dietary Intake   Breakfast Pancakes with peanut butter, coffee with regular creamer OR shredded wheat or plain cheerios and whole milk, banana    Snack (morning) cheese sitck (low fat) and sugar free crackers with chocolate, banana or other fruit    Lunch ham and cheese sandwich OR  leftovers OR fast food    Snack (afternoon) boiled egg, fruit, sugar free cookies    Dinner roast beef, swiss cheese on onion bagel, small salad with ranch dressing, baked beans   7:30   Snack (evening) usually no sugar added ice cream and sugar free cookies    Beverage(s) water, flavored water, coffee with regular creamer, whole milk (2-3 cups daily) occasional beer, no sugar gatorade with vodka (2 daily)      Activity / Exercise   Activity / Exercise Type Light (walking / raking leaves)   work related   How many days per week do you exercise? 4    How many minutes per day do you exercise? 60    Total minutes per week of exercise 240      Patient Education   Previous Diabetes Education Yes (please comment)   years ago   Disease Pathophysiology Explored patient's options for treatment of their diabetes    Healthy Eating Plate Method;Food label reading, portion sizes and measuring food.;Role of diet in the treatment of diabetes and the relationship between the three main macronutrients and blood glucose level;Meal options for control of blood glucose level and chronic complications.;Information on hints to eating out and  maintain blood glucose control.    Being Active Role of exercise on diabetes management, blood pressure control and cardiac health.    Medications Reviewed patients medication for diabetes, action, purpose, timing of dose and side effects.    Monitoring Identified appropriate SMBG and/or A1C goals.;Taught/evaluated CGM (comment);Daily foot exams;Yearly dilated eye exam    Acute complications Taught prevention, symptoms, and  treatment of hypoglycemia - the 15 rule.;Discussed and identified patients' prevention, symptoms, and treatment of hyperglycemia.    Diabetes Stress and Support Identified and addressed patients feelings and concerns about diabetes      Individualized Goals (developed by patient)  Nutrition Follow meal plan discussed    Physical Activity Exercise 3-5 times per week;60 minutes per day    Medications take my medication as prescribed    Monitoring  Consistenly use CGM    Problem Solving Eating Pattern    Reducing Risk examine blood glucose patterns;do foot checks daily;treat hypoglycemia with 15 grams of carbs if blood glucose less than 70mg /dL      Post-Education Assessment   Patient understands the diabetes disease and treatment process. Demonstrates understanding / competency    Patient understands incorporating nutritional management into lifestyle. Comprehends key points    Patient undertands incorporating physical activity into lifestyle. Demonstrates understanding / competency    Patient understands using medications safely. Demonstrates understanding / competency    Patient understands monitoring blood glucose, interpreting and using results Demonstrates understanding / competency    Patient understands prevention, detection, and treatment of acute complications. Demonstrates understanding / competency    Patient understands prevention, detection, and treatment of chronic complications. Demonstrates understanding / competency    Patient understands how to develop  strategies to address psychosocial issues. Demonstrates understanding / competency    Patient understands how to develop strategies to promote health/change behavior. Demonstrates understanding / competency      Outcomes   Expected Outcomes Demonstrated interest in learning. Expect positive outcomes    Future DMSE 3-4 months    Program Status Not Completed             Individualized Plan for Diabetes Self-Management Training:   Learning Objective:  Patient will have a greater understanding of diabetes self-management. Patient education plan is to attend individual and/or group sessions per assessed needs and concerns.   Plan:   Patient Instructions  Consider getting your Vitamin B-12 rechecked.  Consider using your phone for the [place] next time you change the sensor. Be mindful about your snack choices and portion sizes of snack foods.  Plan:  Aim for 3-4 Carb Choices per meal (45-60 grams) +/- 1 either way  Aim for 0-1 Carbs per snack if hungry  Include protein in moderation with your meals and snacks Consider reading food labels for Total Carbohydrate of foods Consider  increasing your activity level by walking for at least 30 minutes daily as tolerated Consistent CGM use Continue taking medication as directed by MD        Expected Outcomes:  Demonstrated interest in learning. Expect positive outcomes  Education material provided: Food label handouts, Meal plan card, My Plate, Snack sheet, and Diabetes Resources, dining out tips  If problems or questions, patient to contact team via:  Phone  Future DSME appointment: 3-4 months

## 2022-12-02 ENCOUNTER — Other Ambulatory Visit: Payer: Self-pay | Admitting: Cardiology

## 2022-12-08 ENCOUNTER — Encounter: Payer: Medicare Other | Admitting: Medical

## 2022-12-19 ENCOUNTER — Ambulatory Visit: Payer: Medicare Other | Admitting: Endocrinology

## 2022-12-22 ENCOUNTER — Ambulatory Visit (INDEPENDENT_AMBULATORY_CARE_PROVIDER_SITE_OTHER): Payer: Medicare Other | Admitting: Medical

## 2022-12-22 ENCOUNTER — Encounter: Payer: Self-pay | Admitting: Medical

## 2022-12-22 VITALS — BP 110/70 | HR 74 | Ht 69.0 in | Wt 173.2 lb

## 2022-12-22 DIAGNOSIS — R5383 Other fatigue: Secondary | ICD-10-CM | POA: Diagnosis not present

## 2022-12-22 DIAGNOSIS — I509 Heart failure, unspecified: Secondary | ICD-10-CM | POA: Diagnosis not present

## 2022-12-22 DIAGNOSIS — I2584 Coronary atherosclerosis due to calcified coronary lesion: Secondary | ICD-10-CM

## 2022-12-22 DIAGNOSIS — E782 Mixed hyperlipidemia: Secondary | ICD-10-CM

## 2022-12-22 DIAGNOSIS — I10 Essential (primary) hypertension: Secondary | ICD-10-CM | POA: Diagnosis not present

## 2022-12-22 DIAGNOSIS — I2510 Atherosclerotic heart disease of native coronary artery without angina pectoris: Secondary | ICD-10-CM

## 2022-12-22 DIAGNOSIS — K7030 Alcoholic cirrhosis of liver without ascites: Secondary | ICD-10-CM

## 2022-12-22 DIAGNOSIS — N1831 Chronic kidney disease, stage 3a: Secondary | ICD-10-CM

## 2022-12-22 DIAGNOSIS — R634 Abnormal weight loss: Secondary | ICD-10-CM

## 2022-12-22 DIAGNOSIS — Z7185 Encounter for immunization safety counseling: Secondary | ICD-10-CM

## 2022-12-22 LAB — LIPID PANEL
Chol/HDL Ratio: 2.4 ratio (ref 0.0–5.0)
Cholesterol, Total: 107 mg/dL (ref 100–199)
VLDL Cholesterol Cal: 17 mg/dL (ref 5–40)

## 2022-12-22 NOTE — Progress Notes (Signed)
Subjective:  Theodore Bush is a 72 y.o. male who presents for Chief Complaint  Patient presents with   Diabetes    Fasting med check. He is asking to have his testosterone checked. He said he has lost a lot of weight. He was on Mounjaro and Ozempic, didn't want to take any longer. Feels like he has lost lots of muscle mass, does not have a hair left on his body.      Here for med check.    Having some concerns about energy levels and muscle loss.  Over time he feels like he is lost some muscle mass.  Still works, gets 10,000 steps daily.  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  He sees endocrinology.  He was recently on GLP-1 medication and lost too much weight.  Was on Mounjaro and ozempic per endocrinology, couldn't tolerate these.  Lost too much weight including muscle mass.  No longer on GLP1 medication.  Wants testosterone checked.  He has lost more muscle mass than he think  Has cut back on drinking  OSA diagnosed 10 years ago.  But since losing a lot of weight in the last 2 years he does not have problems with this now no longer uses CPAP.  No longer has problems with breathing even  He is compliant with his medications in general  No other aggravating or relieving factors.    No other c/o.  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   Asthma    Atrial fibrillation 09/02/2018   Atrial fibrillation    CHF (congestive heart failure)    Chronic kidney disease 06/11/2019   Cirrhosis    Controlled diabetes mellitus type 2 with complications 09/02/2018   Controlled type 2 diabetes mellitus with complication, with long-term current use of insulin 09/02/2018   Coronary artery calcification 06/11/2019   Hyperlipidemia    Hypertension    Insomnia 06/11/2019   Pneumonia 2019   Sleep apnea    he was 280lb at the time   Staph infection    to elbow 15-67yrs ago   Type 2 diabetes mellitus with hyperglycemia,  without long-term current use of insulin 02/03/2022   Current Outpatient Medications on File Prior to Visit  Medication Sig Dispense Refill   ACCU-CHEK GUIDE test strip USE TO TEST 1 TO 2 TIMES PER DAY 100 strip 3   Accu-Chek Softclix Lancets lancets USE TO TEST 1 TO 2 TIMES PER DAY 100 each 3   Adalimumab 40 MG/0.4ML PNKT Inject 1 each into the skin. 2 times a months.     atorvastatin (LIPITOR) 80 MG tablet TAKE 1 TABLET(80 MG) BY MOUTH DAILY 60 tablet 3   Blood Glucose Monitoring Suppl (ACCU-CHEK GUIDE ME) w/Device KIT Test 1-2 times daily. Pt needs accu-chek meter. Dx. Ell.9 1 kit 0   budesonide-formoterol (SYMBICORT) 160-4.5 MCG/ACT inhaler INHALE 2 PUFFS INTO THE LUNGS TWICE DAILY 10.2 g 6   carvedilol (COREG) 25 MG tablet TAKE 1 TABLET(25 MG) BY MOUTH TWICE DAILY 180 tablet 1   empagliflozin (JARDIANCE) 25 MG TABS tablet Take 1 tablet (25 mg total) by mouth daily. 90 tablet 2   ENTRESTO 24-26 MG TAKE 1 TABLET BY MOUTH TWICE DAILY 180 tablet 3   Fenofibrate 50 MG CAPS Take 1 capsule (50 mg total) by mouth daily. 90 capsule 2   HYDROcodone-acetaminophen (NORCO) 10-325 MG tablet Take 1 tablet by mouth every 6 (six) hours as  needed.     insulin glargine (LANTUS SOLOSTAR) 100 UNIT/ML Solostar Pen Inject 15 Units into the skin every morning. (Patient taking differently: Inject 20 Units into the skin every morning.) 15 mL 5   Insulin Pen Needle (PEN NEEDLES) 32G X 4 MM MISC 1 each by Other route daily. 100 each 2   metFORMIN (GLUCOPHAGE-XR) 500 MG 24 hr tablet TAKE 4 TABLETS(2000 MG) BY MOUTH DAILY WITH BREAKFAST 360 tablet 0   montelukast (SINGULAIR) 10 MG tablet Take 1 tablet (10 mg total) by mouth at bedtime. 90 tablet 0   Multiple Vitamin (MULTIVITAMIN WITH MINERALS) TABS tablet Take 1 tablet by mouth daily.     rivaroxaban (XARELTO) 20 MG TABS tablet TAKE 1 TABLET(20 MG) BY MOUTH DAILY WITH SUPPER 30 tablet 5   spironolactone (ALDACTONE) 25 MG tablet Take 1 tablet (25 mg total) by mouth  every other day. 45 tablet 2   traZODone (DESYREL) 50 MG tablet TAKE ONE-HALF TO ONE TABLETS BY MOUTH AT BEDTIME AS NEEDED FOR SLEEP. (Patient taking differently: 50 mg.) 90 tablet 0   Vitamin D, Ergocalciferol, (DRISDOL) 1.25 MG (50000 UNIT) CAPS capsule Take 50,000 Units by mouth every 7 (seven) days.     albuterol (VENTOLIN HFA) 108 (90 Base) MCG/ACT inhaler INHALE 1 PUFF INTO THE LUNGS EVERY 6 HOURS AS NEEDED FOR WHEEZING OR SHORTNESS OF BREATH (Patient not taking: Reported on 12/22/2022) 18 g 0   No current facility-administered medications on file prior to visit.   Past Surgical History:  Procedure Laterality Date   COLONOSCOPY  12/2021   PALATE / UVULA BIOPSY / EXCISION     trimmed several times     The following portions of the patient's history were reviewed and updated as appropriate: allergies, current medications, past family history, past medical history, past social history, past surgical history and problem list.  ROS Otherwise as in subjective above    Objective: BP 110/70   Pulse 74   Ht 5\' 9"  (1.753 m)   Wt 173 lb 3.2 oz (78.6 kg)   SpO2 97%   BMI 25.58 kg/m   Wt Readings from Last 3 Encounters:  12/22/22 173 lb 3.2 oz (78.6 kg)  12/01/22 175 lb (79.4 kg)  11/03/22 177 lb 3.2 oz (80.4 kg)    General appearance: alert, no distress, well developed, well nourished Neck: supple, no lymphadenopathy, no thyromegaly, no masses, no JVD Heart: RRR, normal S1, S2, no murmurs Lungs: CTA bilaterally, no wheezes, rhonchi, or rales Abdomen: +bs, soft, no obvious bruits, some hepatomegaly noted, otherwise non tender, non distended, no masses, no obvious splenomegaly Pulses: 2+ radial pulses, 2+ pedal pulses, normal cap refill Ext: no edema    Assessment: Encounter Diagnoses  Name Primary?   Other fatigue Yes   Coronary artery calcification    Chronic congestive heart failure, unspecified heart failure type    Primary hypertension    Alcoholic cirrhosis of liver  without ascites    Chronic kidney disease, stage 3a    Mixed dyslipidemia    Weight loss    Vaccine counseling      Plan: Fatigue-we discussed possible causes.  Updated labs today  CAD, hyperlipidemia-continue statin Lipitor 80 mg daily  CHF-followed by cardiology.  Compliant with medications  History of cirrhosis-recently has cut back on alcohol.  We reviewed the CT scan he had May 2023 for surveillance of thoracic aorta but there was notation of splenomegaly and liver cirrhotic changes.  Limit or try to quit alcohol.  Try  to eat a healthy low fat diet.  I will reach out to gastroenterology to see if he needs any updated further evaluation of the liver at this time.  I reviewed labs that he has had in the past year in this regard  Hypertension-continue current medication  Weight loss recently due to GLP-1 medication which she has now discontinued.  Continue weightbearing and aerobic exercise which he is doing through work.  He still works 4 days/week doing a lot of walking but also weightbearing exercise.  I recommended he get shingles and Tdap vaccine sent to his pharmacy   Zayir was seen today for diabetes.  Diagnoses and all orders for this visit:  Other fatigue -     Testosterone -     TSH -     Vitamin B12 -     Folate  Coronary artery calcification -     Lipid panel  Chronic congestive heart failure, unspecified heart failure type  Primary hypertension  Alcoholic cirrhosis of liver without ascites -     Vitamin B12 -     Folate  Chronic kidney disease, stage 3a  Mixed dyslipidemia -     Lipid panel  Weight loss -     Testosterone -     TSH  Vaccine counseling    Follow up: pending labs

## 2022-12-22 NOTE — Patient Instructions (Signed)
Get your Tdap and Shingrix vaccines at your pharmacy and ask them to send [place] a copy of vaccine documentation.

## 2022-12-23 LAB — LIPID PANEL
HDL: 44 mg/dL (ref >39)
LDL Chol Calc (NIH): 46 mg/dL (ref 0–99)
Triglycerides: 83 mg/dL (ref 0–149)

## 2022-12-23 LAB — VITAMIN B12: Vitamin B-12: 682 pg/mL (ref 232–1245)

## 2022-12-23 LAB — TSH: TSH: 0.967 u[IU]/mL (ref 0.450–4.500)

## 2022-12-23 LAB — TESTOSTERONE: Testosterone: 256 ng/dL — ABNORMAL LOW (ref 264–916)

## 2022-12-23 LAB — FOLATE: Folate: >20 ng/mL (ref >3.0)

## 2022-12-25 ENCOUNTER — Telehealth: Payer: Self-pay

## 2022-12-25 NOTE — Telephone Encounter (Signed)
Patient is scheduled follow up appointment with Dr. Barron Alvine on 03/16/23 at 1:20 PM per Dr. Salena Saner.

## 2022-12-25 NOTE — Telephone Encounter (Signed)
-----   Message from Upsala V, DO sent at 12/25/2022  7:59 AM EDT ----- Can you please schedule this patient an OV with me or one of the APP's to discuss abnormal CT from last year and whether or not any further evaluation is needed for possible cirrhosis.  Thanks.  ----- Message ----- From: Genia Del Sent: 12/22/2022   5:15 PM EDT To: Shellia Cleverly, DO  Hello Dr. Howard Pouch have seen our mutual patient back in January of last year.  Given prior findings on scan of cirrhosis do you need to see him back for follow-up on cirrhosis or FibroScan or other evaluation at this time?  He has had some labs in the past year and has had CT scan that was able to see part of the liver and spleen this past year.  I wanted to touch base with you on his case  Thanks Vincenza Hews

## 2022-12-26 ENCOUNTER — Other Ambulatory Visit: Payer: Self-pay | Admitting: Physician Assistant

## 2022-12-26 ENCOUNTER — Other Ambulatory Visit: Payer: Self-pay | Admitting: Cardiology

## 2022-12-26 DIAGNOSIS — I4819 Other persistent atrial fibrillation: Secondary | ICD-10-CM

## 2022-12-26 DIAGNOSIS — Z7901 Long term (current) use of anticoagulants: Secondary | ICD-10-CM

## 2022-12-26 NOTE — Telephone Encounter (Signed)
Pt last saw Dr [place] 11/03/22, last labs 10/06/22 Creat 1.02, age 72, weight 78.6kg, CrCl 74.92, based on CrCl pt is on appropriate dosage of Xarelto 20mg  QD for afib.  Will refill rx.

## 2022-12-26 NOTE — Progress Notes (Signed)
Results sent through MyChart

## 2022-12-27 ENCOUNTER — Telehealth: Payer: Self-pay | Admitting: Medical

## 2022-12-27 NOTE — Telephone Encounter (Signed)
Theodore Bush called, Theodore Bush seen his lab results and your comment on My Chart and Theodore Bush is wondering when Theodore Bush should come back in. Also would it be a lab visit or a visit with you?

## 2022-12-28 ENCOUNTER — Other Ambulatory Visit: Payer: Self-pay | Admitting: Thoracic Surgery (Cardiothoracic Vascular Surgery)

## 2022-12-28 DIAGNOSIS — I7121 Aneurysm of the ascending aorta, without rupture: Secondary | ICD-10-CM

## 2023-01-03 ENCOUNTER — Other Ambulatory Visit: Payer: Self-pay | Admitting: Physician Assistant

## 2023-01-03 ENCOUNTER — Other Ambulatory Visit: Payer: Self-pay | Admitting: Medical

## 2023-01-03 DIAGNOSIS — J4540 Moderate persistent asthma, uncomplicated: Secondary | ICD-10-CM

## 2023-01-16 ENCOUNTER — Other Ambulatory Visit: Payer: Self-pay | Admitting: Medical

## 2023-01-23 ENCOUNTER — Other Ambulatory Visit: Payer: Self-pay | Admitting: Medical

## 2023-01-24 LAB — HM DIABETES EYE EXAM

## 2023-02-09 ENCOUNTER — Ambulatory Visit (INDEPENDENT_AMBULATORY_CARE_PROVIDER_SITE_OTHER): Payer: Medicare Other | Admitting: Thoracic Surgery (Cardiothoracic Vascular Surgery)

## 2023-02-09 ENCOUNTER — Ambulatory Visit
Admission: RE | Admit: 2023-02-09 | Discharge: 2023-02-09 | Disposition: A | Discharge status: Home / Self Care | Payer: Medicare Other | Source: Ambulatory Visit | Attending: Thoracic Surgery (Cardiothoracic Vascular Surgery) | Admitting: Thoracic Surgery (Cardiothoracic Vascular Surgery)

## 2023-02-09 DIAGNOSIS — I7121 Aneurysm of the ascending aorta, without rupture: Secondary | ICD-10-CM

## 2023-02-09 MED ORDER — IOPAMIDOL (ISOVUE-370) INJECTION 76%
75.0000 mL | Freq: Once | INTRAVENOUS | Status: AC | PRN
  Administered 2023-02-09: 75 mL via INTRAVENOUS

## 2023-02-09 NOTE — Progress Notes (Signed)
     [address]       [place] [address]             [phone]       Patient: Home Provider: Office Consent for Telemedicine visit obtained.  Today's visit was completed via a real-time telehealth (see specific modality noted below). The patient/authorized person provided oral consent at the time of the visit to engage in a telemedicine encounter with the present provider at [facility]. The patient/authorized person was informed of the potential benefits, limitations, and risks of telemedicine. The patient/authorized person expressed understanding that the laws that protect confidentiality also apply to telemedicine. The patient/authorized person acknowledged understanding that telemedicine does not provide emergency services and that he or she would need to call 911 or proceed to the nearest hospital for help if such a need arose.   Total time spent in the clinical discussion 10 minutes.  Telehealth Modality: Phone visit (audio only)  Recent Radiology Findings:  IMPRESSION: 1. Unchanged 4.9 cm ascending thoracic aortic aneurysm. Recommend semi-annual imaging followup by CTA or MRA. This recommendation follows 2010 ACCF/AHA/AATS/ACR/ASA/SCA/SCAI/SIR/STS/SVM Guidelines for the Diagnosis and Management of Patients With Thoracic Aortic Disease. Circulation. 2010; 121: B147-W295. Aortic aneurysm NOS (ICD10-I71.9) 2. Cirrhosis with small volume upper abdominal ascites. 3. Small pericardial effusion. 4.  Aortic Atherosclerosis (ICD10-I70.0).  I had a telephone visit with [name].  Overall, he is doing well.  He has no complaints  Assessment:  72yo male with a 4.9 cm ascending aortic aneurysm.  Echocardiogram shows a tricuspid valve without evidence of regurgitation.  We discussed the natural history and and risk factors for growth of ascending aortic aneurysms.  We covered the importance of smoking cessation, tight blood pressure control, refraining from lifting heavy  objects, and avoiding fluoroquinolones.  The patient is aware of signs and symptoms of aortic dissection and when to present to the emergency department.  We will continue surveillance and a repeat CT was ordered for 12 months.  Theodore Bush Theodore Bush

## 2023-02-10 ENCOUNTER — Other Ambulatory Visit: Payer: Self-pay | Admitting: Medical

## 2023-02-10 DIAGNOSIS — J4540 Moderate persistent asthma, uncomplicated: Secondary | ICD-10-CM

## 2023-02-16 ENCOUNTER — Other Ambulatory Visit: Payer: Medicare Other

## 2023-02-16 ENCOUNTER — Encounter: Payer: Self-pay | Admitting: Endocrinology

## 2023-02-16 ENCOUNTER — Ambulatory Visit (INDEPENDENT_AMBULATORY_CARE_PROVIDER_SITE_OTHER): Payer: Medicare Other | Admitting: Endocrinology

## 2023-02-16 VITALS — BP 130/82 | HR 65 | Ht 69.0 in | Wt 182.6 lb

## 2023-02-16 DIAGNOSIS — R7989 Other specified abnormal findings of blood chemistry: Secondary | ICD-10-CM | POA: Diagnosis not present

## 2023-02-16 DIAGNOSIS — Z794 Long term (current) use of insulin: Secondary | ICD-10-CM | POA: Diagnosis not present

## 2023-02-16 DIAGNOSIS — E1165 Type 2 diabetes mellitus with hyperglycemia: Secondary | ICD-10-CM

## 2023-02-16 LAB — COMPREHENSIVE METABOLIC PANEL
ALT: 32 U/L (ref 0–53)
AST: 37 U/L (ref 0–37)
Albumin: 4.5 g/dL (ref 3.5–5.2)
Alkaline Phosphatase: 35 U/L — ABNORMAL LOW (ref 39–117)
BUN: 17 mg/dL (ref 6–23)
CO2: 34 mEq/L — ABNORMAL HIGH (ref 19–32)
Calcium: 9.5 mg/dL (ref 8.4–10.5)
Chloride: 97 mEq/L (ref 96–112)
Creatinine, Ser: 0.99 mg/dL (ref 0.40–1.50)
GFR: 76.97 mL/min (ref >60.00)
Glucose, Bld: 62 mg/dL — ABNORMAL LOW (ref 70–99)
Potassium: 4.4 mEq/L (ref 3.5–5.1)
Sodium: 136 mEq/L (ref 135–145)
Total Bilirubin: 1.1 mg/dL (ref 0.2–1.2)
Total Protein: 7.8 g/dL (ref 6.0–8.3)

## 2023-02-16 LAB — POCT GLYCOSYLATED HEMOGLOBIN (HGB A1C): Hemoglobin A1C: 7.6 % — AB (ref 4.0–5.6)

## 2023-02-16 LAB — LUTEINIZING HORMONE: LH: 10.19 m[IU]/mL (ref 3.10–34.60)

## 2023-02-16 LAB — MICROALBUMIN / CREATININE URINE RATIO
Creatinine,U: 19.3 mg/dL
Microalb Creat Ratio: 90.6 mg/g — ABNORMAL HIGH (ref 0.0–30.0)
Microalb, Ur: 17.5 mg/dL — ABNORMAL HIGH (ref 0.0–1.9)

## 2023-02-16 MED ORDER — REPAGLINIDE 1 MG PO TABS: 1.0000 mg | ORAL_TABLET | Freq: Three times a day (TID) | ORAL | 1 refills | Status: DC

## 2023-02-16 MED ORDER — TRESIBA FLEXTOUCH 100 UNIT/ML ~~LOC~~ SOPN: 20.0000 [IU] | PEN_INJECTOR | Freq: Every day | SUBCUTANEOUS | 1 refills | Status: DC

## 2023-02-16 NOTE — Progress Notes (Signed)
Patient ID: Theodore Bush, male   DOB: [birth year], 72 y.o.   MRN: [id number]           Reason for Appointment: Type II Diabetes follow-up   History of Present Illness   Diagnosis date: 1995  Previous history:  Oral hypoglycemic drugs previously used are: Metformin, Januvia, linagliptin, Jardiance Jardiance was started in 2018 Insulin was started in 2018 He has mostly been only on Lantus insulin as much as 40 units daily once a day  A1c range in the last few years is: 6.4-8.6  Recent history:     Non-insulin hypoglycemic drugs:  metformin 2000 mg daily, Jardiance 25 mg daily     Insulin regimen: Lantus 20 units once a day         Side effects from medications: Nausea vomiting from 5 mg Mounjaro, weight loss from Ozempic 0.25 mg weekly  Detailed evaluation of his previous diabetes management, endocrine history, related problems and labs were reviewed and summarized as below  Current self management, blood sugar patterns and problems identified:  A1c is about the same at 7.6, was 7.5 About 2 months ago he started using the freestyle libre 3 sensor, previously was mostly checking fasting readings He is generally eating out because of his work and his meals can be variable With this he has frequent postprandial hyperglycemia after lunch but this is quite inconsistent Also with his sensor appears that his overnight blood sugars are mostly high except this morning when he fasted for the labs After starting to use the sensor he has gone up on his Lantus on his own by 5 units He has regained the weight that he had been losing with Ozempic and more GMI on the sensor is 7.6 also for the last 2 weeks  Exercise: Overall active with construction work  Diet management: Eating small portions      Freestyle libre 3 for the last 2 weeks was reviewed by download with the following interpretation  Overnight blood sugars are fairly consistently high in the mid 100 range with mild  variability No hypoglycemia overnight although blood sugars were low normal after 9 AM today Blood sugars Premeal before dinnertime also consistently high but depend on the readings after lunch POSTPRANDIAL readings are quite variable from day-to-day He has mostly high readings at times after lunch Only occasionally has readings above the target range after dinner Highest reading was on Saturday 5/25 with blood sugars 350 or more for several hours in the afternoon No hypoglycemia during the day  CGM use % of time   2-week average/GV   Time in range        %  % Time Above 180   % Time above 250   % Time Below 70      PRE-MEAL Fasting Lunch Dinner Bedtime Overall  Glucose range:       Averages: 151  187     POST-MEAL PC Breakfast PC Lunch PC Dinner  Glucose range:     Averages: 165 210 191    Dietician visit: Most recent:    none  Weight control: Previously maximum 285  Wt Readings from Last 3 Encounters:  02/16/23 182 lb 9.6 oz (82.8 kg)  12/22/22 173 lb 3.2 oz (78.6 kg)  12/01/22 175 lb (79.4 kg)            Diabetes labs:  Lab Results  Component Value Date   HGBA1C 7.6 (A) 02/16/2023   HGBA1C 7.5 (H) 10/06/2022   HGBA1C 6.7 (  H) 06/02/2022   Lab Results  Component Value Date   MICROALBUR 3.2 (H) 03/10/2022   LDLCALC 46 12/22/2022   CREATININE 1.02 10/06/2022     Allergies as of 02/16/2023       Reactions   Quinolones Other (See Comments)        Medication List        Accurate as of Feb 16, 2023  3:44 PM. If you have any questions, ask your nurse or doctor.          STOP taking these medications    Lantus SoloStar 100 UNIT/ML Solostar Pen Generic drug: insulin glargine Stopped by: Reather Littler, MD       TAKE these medications    Accu-Chek Guide Me w/Device Kit Test 1-2 times daily. Pt needs accu-chek meter. Dx. Ell.9   Accu-Chek Guide test strip Generic drug: glucose blood USE TO TEST 1 TO 2 TIMES PER DAY   Accu-Chek Softclix Lancets  lancets USE TO TEST 1 TO 2 TIMES PER DAY   Adalimumab 40 MG/0.4ML Pnkt Inject 1 each into the skin. 2 times a months.   albuterol 108 (90 Base) MCG/ACT inhaler Commonly known as: VENTOLIN HFA INHALE 1 PUFF INTO THE LUNGS EVERY 6 HOURS AS NEEDED FOR WHEEZING OR SHORTNESS OF BREATH   atorvastatin 80 MG tablet Commonly known as: LIPITOR TAKE 1 TABLET(80 MG) BY MOUTH DAILY   budesonide-formoterol 160-4.5 MCG/ACT inhaler Commonly known as: SYMBICORT INHALE 2 PUFFS INTO THE LUNGS TWICE DAILY   carvedilol 25 MG tablet Commonly known as: COREG TAKE 1 TABLET(25 MG) BY MOUTH TWICE DAILY   empagliflozin 25 MG Tabs tablet Commonly known as: Jardiance Take 1 tablet (25 mg total) by mouth daily.   Entresto 24-26 MG Generic drug: sacubitril-valsartan TAKE 1 TABLET BY MOUTH TWICE DAILY   Fenofibrate 50 MG Caps TAKE ONE TABLET BY MOUTH ONCE DAILY   FreeStyle Libre 3 Sensor Misc by Does not apply route.   HYDROcodone-acetaminophen 10-325 MG tablet Commonly known as: NORCO Take 1 tablet by mouth every 6 (six) hours as needed.   metFORMIN 500 MG 24 hr tablet Commonly known as: GLUCOPHAGE-XR TAKE 4 TABLETS(2000 MG) BY MOUTH DAILY WITH BREAKFAST   montelukast 10 MG tablet Commonly known as: SINGULAIR TAKE 1 TABLET(10 MG) BY MOUTH AT BEDTIME   multivitamin with minerals Tabs tablet Take 1 tablet by mouth daily.   Pen Needles 32G X 4 MM Misc 1 each by Other route daily.   repaglinide 1 MG tablet Commonly known as: PRANDIN Take 1 tablet (1 mg total) by mouth 3 (three) times daily before meals. Started by: Reather Littler, MD   rivaroxaban 20 MG Tabs tablet Commonly known as: Xarelto Take 1 tablet (20 mg total) by mouth daily with supper.   spironolactone 25 MG tablet Commonly known as: ALDACTONE TAKE 1 TABLET(25 MG) BY MOUTH EVERY OTHER DAY   traZODone 50 MG tablet Commonly known as: DESYREL TAKE ONE-HALF TO ONE TABLETS BY MOUTH AT BEDTIME AS NEEDED FOR SLEEP.   Evaristo Bury  FlexTouch 100 UNIT/ML FlexTouch Pen Generic drug: insulin degludec Inject 20 Units into the skin daily. Started by: Reather Littler, MD   Vitamin D (Ergocalciferol) 1.25 MG (50000 UNIT) Caps capsule Commonly known as: DRISDOL Take 50,000 Units by mouth every 7 (seven) days.        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  Asthma    Atrial fibrillation (HCC) 09/02/2018   Atrial fibrillation (HCC)    CHF (congestive heart failure) (HCC)    Chronic kidney disease 06/11/2019   Cirrhosis (HCC)    Controlled diabetes mellitus type 2 with complications (HCC) 09/02/2018   Controlled type 2 diabetes mellitus with complication, with long-term current use of insulin (HCC) 09/02/2018   Coronary artery calcification 06/11/2019   Hyperlipidemia    Hypertension    Insomnia 06/11/2019   Pneumonia 2019   Sleep apnea    he was 280lb at the time   Staph infection    to elbow 15-62yrs ago   Type 2 diabetes mellitus with hyperglycemia, without long-term current use of insulin (HCC) 02/03/2022    Past Surgical History:  Procedure Laterality Date   COLONOSCOPY  12/2021   PALATE / UVULA BIOPSY / EXCISION     trimmed several times    Family History  Problem Relation Age of Onset   Lupus Mother    Hypertension Mother    Diabetes Father    Diabetes Paternal Grandmother    Colon cancer Neg Hx    Stomach cancer Neg Hx    Esophageal cancer Neg Hx    Rectal cancer Neg Hx     Social History:  reports that he has never smoked. His smokeless tobacco use includes snuff. He reports current alcohol use of about 10.0 standard drinks of alcohol per week. He reports current drug use. Drugs: Marijuana and Hydrocodone.  Review of Systems:  Last diabetic eye exam date 07/2020  Last foot exam date: 8/21  Symptoms of neuropathy: None  Blood pressure management: Unclear if  he has been diagnosed to have hypertension previously Treatment includes Entresto and Jardiance with history of systolic CHF  BP Readings from Last 3 Encounters:  02/16/23 130/82  12/22/22 110/70  11/03/22 136/76    Lipids: Treated with atorvastatin 80 mg prescribed by his cardiologist    Lab Results  Component Value Date   CHOL 107 12/22/2022   CHOL 121 06/10/2021   CHOL 132 12/11/2019   Lab Results  Component Value Date   HDL 44 12/22/2022   HDL 51 06/10/2021   HDL 52 12/11/2019   Lab Results  Component Value Date   LDLCALC 46 12/22/2022   LDLCALC 56 06/10/2021   LDLCALC 59 12/11/2019   Lab Results  Component Value Date   TRIG 83 12/22/2022   TRIG 66 06/10/2021   TRIG 118 12/11/2019   Lab Results  Component Value Date   CHOLHDL 2.4 12/22/2022   CHOLHDL 2.4 06/10/2021   CHOLHDL 2.5 12/11/2019   Lab Results  Component Value Date   LDLDIRECT 51.0 06/02/2022    He has been diagnosed with alcoholic cirrhosis of liver  Previously when he was losing weight he was feeling weak and tired Last month he asked his PCP to check his testosterone which is mildly decreased at 256, this was checked at 11 AM   Fibrosis 4 Score = 2.44       Fib-4 interpretation is not validated for people under 35 or over 52 years of age.      Examination:   BP 130/82   Pulse 65   Ht 5\' 9"  (1.753 m)   Wt 182 lb 9.6 oz (82.8 kg)   SpO2 98%   BMI 26.97 kg/m   Body mass index is 26.97 kg/m.    ASSESSMENT/ PLAN:    Diabetes type 2, non obese:   Current regimen: Lantus insulin  20 units daily, Jardiance 25 mg daily, metformin ER 2 g daily  A1c is now 7.6 compared to 7.5  Blood sugars are under fair control with most of his hyperglycemia related to eating out at lunchtime Occasionally will have high readings after dinner also but generally only on weekends His weight has gone up with not taking Ozempic for some time Discussed his freestyle [employer] patterns and adjustment  of his medications and insulin  Recommendations:  He will try to continue modifying his diet based on postprandial readings and cut back on higher carbohydrate and high fat meals He will go up at least 2 units on his Lantus For more consistent control over 24 hours we will switch to [place] at the same dose of 22 units and discussed the differences between the 2 insulins He can go up further if needed Trial of PRANDIN 1 mg with lunch when he is having significant carbohydrate If having more difficulty with postprandial readings discussed that he may need a short acting mealtime coverage insulin Also continue Jardiance and metformin  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

## 2023-02-16 NOTE — Patient Instructions (Signed)
Take 22 units on insulin

## 2023-02-19 ENCOUNTER — Other Ambulatory Visit: Payer: Self-pay | Admitting: Endocrinology

## 2023-02-19 NOTE — Progress Notes (Signed)
Kidney test is somewhat worse, may improve with better diabetes control.  Make sure he is not having any recent prostate or urinary infection.  Also needs to come back to do morning fasting testosterone level as this was not properly collected by the lab

## 2023-02-20 ENCOUNTER — Other Ambulatory Visit: Payer: Self-pay | Admitting: Physician Assistant

## 2023-02-20 NOTE — Telephone Encounter (Signed)
Sees Endocrinology

## 2023-02-21 ENCOUNTER — Encounter: Payer: Self-pay | Admitting: Endocrinology

## 2023-02-23 ENCOUNTER — Other Ambulatory Visit (INDEPENDENT_AMBULATORY_CARE_PROVIDER_SITE_OTHER): Payer: Medicare Other

## 2023-02-23 DIAGNOSIS — R7989 Other specified abnormal findings of blood chemistry: Secondary | ICD-10-CM

## 2023-02-24 LAB — PROLACTIN: Prolactin: 39 ng/mL — ABNORMAL HIGH (ref 3.6–25.2)

## 2023-02-24 LAB — TESTOSTERONE, FREE, TOTAL, SHBG

## 2023-02-25 LAB — TESTOSTERONE, FREE, TOTAL, SHBG

## 2023-02-27 ENCOUNTER — Telehealth: Payer: Self-pay | Admitting: Medical

## 2023-02-27 NOTE — Telephone Encounter (Signed)
Prescription Request  02/27/2023  LOV: Visit date not found  What is the name of the medication or equipment? Jaurdiance  Have you contacted your pharmacy to request a refill? Yes   Which pharmacy would you like this sent to?  [facility] - [place], [place] - [address] [place] [address] Phone: [phone] Fax: [phone]    Patient notified that their request is being sent to the clinical staff for review and that they should receive a response within 2 business days.   Please advise at Mobile [phone] (mobile)

## 2023-02-28 MED ORDER — EMPAGLIFLOZIN 25 MG PO TABS: 25.0000 mg | ORAL_TABLET | Freq: Every day | ORAL | 2 refills | Status: DC

## 2023-03-01 LAB — TESTOSTERONE, FREE, TOTAL, SHBG: Sex Hormone Binding: 49.6 nmol/L (ref 19.3–76.4)

## 2023-03-01 NOTE — Progress Notes (Signed)
Testosterone level is low but prolactin hormone from the pituitary gland in the brain is high.  Recommend getting an MRI scan of the pituitary gland first before treatment, will schedule if this is okay with him

## 2023-03-02 ENCOUNTER — Encounter: Payer: Self-pay | Admitting: Dietician

## 2023-03-02 ENCOUNTER — Encounter: Payer: Medicare Other | Attending: Endocrinology | Admitting: Dietician

## 2023-03-02 VITALS — Wt 174.0 lb

## 2023-03-02 DIAGNOSIS — Z794 Long term (current) use of insulin: Secondary | ICD-10-CM | POA: Insufficient documentation

## 2023-03-02 DIAGNOSIS — E1165 Type 2 diabetes mellitus with hyperglycemia: Secondary | ICD-10-CM | POA: Diagnosis present

## 2023-03-02 NOTE — Progress Notes (Signed)
Diabetes Self-Management Education  Visit Type: Follow-up  Appt. Start Time: 1030 Appt. End Time: 1100  03/02/2023  [name], identified by name and date of birth, is a 72 y.o. male with a diagnosis of Diabetes:  .   ASSESSMENT Patient is here today alone.  He was last seen by this RD on 12/01/2022.  Patient states that he was eating a lot when he gained to 182 but has since lost to his UBW. He states that he feels well and is continuing to work 40 hours per week. Diet remains high in fat/saturated fat and currently low in vegetables.  Some sweets.  He states that he is working to be more careful about this. Drank 6 shots of alcohol last night.  Discussed concerns related to his liver and effect on his blood glucose. Patient stated that he was told he should get an MRI for concerning lab.  He stated that he plans on following up.  He started taking the [place] a couple of days ago and changed the time to after lunch to see if this would help his blood glucose - I explained how long acting insulin works and that it is fine for him to take this consistently in the am.  History includes:  Type 2 diabetes (1995 and insulin since 2018), OSA (improved after weight loss - no C-pap), HLD, HTN, CKD, CHF, Cirrhosis, asthma, A-fib Medications include:  Jardiance, Metformin XR, Tresiba 22 units after lunch, trial of prandin with lunch and occasionally with dinner, spironolactone Labs noted to include:  A1C 7.6% 02/16/2023,  7.5% increased from 6.7% 06/02/2022. GFR 76 on 02/16/2023, Vitamin B-12 321 06/11/2019 and 682 on 12/22/2022. CGM:  FreeStyle Libre 3   CGM Results from download: 03/02/2023  % Time CGM active:   96 %   (Goal >70%)  Average glucose:   188 mg/dL for 14 days  Glucose management indicator:   7.8 %  Time in range (70-180 mg/dL):   48 %   (Goal >40%)  Time High (181-250 mg/dL):   37 %   (Goal < 98%)  Time Very High (>250 mg/dL):    15 %   (Goal < 5%)  Time Low (54-69 mg/dL):    0 %   (Goal <1%)  Time Very Low (<54 mg/dL):   0 %   (Goal <1%)   Weight hx: 70" 174 lbs 03/02/2023 182 lbs 02/16/2023 175 lbs 165 lbs (lost on GLP-1 drugs but stopped as he did not tolerate it) - did not feel well at this weight. "Lost muscle" 280 lbs highest adult weight Goal weight per patient about 180 lbs "want to gain muscle"   Patient lives with his wife.  She does most of the shopping and cooking.  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 Gets 10,000 steps per day on work days. Has weights but does not use them.  Mows his own lawn.  Has lived locally for 3 years to be near family. 2 vodka per day.  Weight 174 lb (78.9 kg). Body mass index is 25.7 kg/m.   Diabetes Self-Management Education - 03/02/23 1109       Visit Information   Visit Type Follow-up      Psychosocial Assessment   Patient Belief/Attitude about Diabetes Motivated to manage diabetes    What is the hardest part about your diabetes right now, causing you the most concern, or is the most worrisome to you about your diabetes?   Making healty  food and beverage choices    Self-care barriers None    Self-management support Doctor's office;CDE visits    Other persons present Patient    Patient Concerns Nutrition/Meal planning;Problem Solving;Healthy Lifestyle    Special Needs None    Preferred Learning Style No preference indicated    Learning Readiness Ready    How often do you need to have someone help you when you read instructions, pamphlets, or other written materials from your doctor or pharmacy? 1 - Never      Pre-Education Assessment   Patient understands the diabetes disease and treatment process. Comprehends key points    Patient understands incorporating nutritional management into lifestyle. Needs Review    Patient undertands incorporating physical activity into lifestyle. Comprehends key points    Patient understands using medications safely. Comprehends key points    Patient understands  monitoring blood glucose, interpreting and using results Comprehends key points    Patient understands prevention, detection, and treatment of acute complications. Comprehends key points    Patient understands prevention, detection, and treatment of chronic complications. Compreheands key points    Patient understands how to develop strategies to address psychosocial issues. Comprehends key points    Patient understands how to develop strategies to promote health/change behavior. Needs Review      Complications   Last HgB A1C per patient/outside source 7.6 %   02/16/2023 stable from 7.5%   How often do you check your blood sugar? > 4 times/day    Fasting Blood glucose range (mg/dL) 16-109;604-540    Postprandial Blood glucose range (mg/dL) >981;191-478;295-621    Number of hyperglycemic episodes ( >200mg /dL): Daily    Can you tell when your blood sugar is high? Yes      Dietary Intake   Breakfast special K with berries (sweetened), 2% milk, mozzarella cheese stick    Lunch beef taco and beef burrito but usually packs sandwich and fruit, low sugar granola bar    Snack (afternoon) fruit    Dinner 1/2 hamburger on bun, boiled potatoes with butter, 2 cheese sticks    Snack (evening) sugar free ice cream, chocolate syrup    Beverage(s) Water, vitamin water, whole milk (20 oz per day), 6 shots vodka last night      Activity / Exercise   Activity / Exercise Type Light (walking / raking leaves)    How many days per week do you exercise? 5    How many minutes per day do you exercise? 60    Total minutes per week of exercise 300      Patient Education   Previous Diabetes Education Yes (please comment)   12/01/2022   Disease Pathophysiology Explored patient's options for treatment of their diabetes    Healthy Eating Meal options for control of blood glucose level and chronic complications.;Effects of alcohol on blood glucose and safety factors with consumption of alcohol.;Plate Method     Medications Reviewed patients medication for diabetes, action, purpose, timing of dose and side effects.    Monitoring Taught/evaluated CGM (comment)    Chronic complications Relationship between chronic complications and blood glucose control;Identified and discussed with patient  current chronic complications    Diabetes Stress and Support Identified and addressed patients feelings and concerns about diabetes;Worked with patient to identify barriers to care and solutions      Individualized Goals (developed by patient)   Nutrition General guidelines for healthy choices and portions discussed    Physical Activity Exercise 5-7 days per week;60 minutes per day  Medications take my medication as prescribed    Monitoring  Consistenly use CGM    Problem Solving Eating Pattern;Addressing barriers to behavior change    Reducing Risk examine blood glucose patterns;do foot checks daily;treat hypoglycemia with 15 grams of carbs if blood glucose less than 70mg /dL    Health Coping Discuss barriers to diabetes care with support person/system (comment specifics as needed)      Patient Self-Evaluation of Goals - Patient rates self as meeting previously set goals (% of time)   Nutrition 50 - 75 % (half of the time)    Physical Activity >75% (most of the time)    Medications >75% (most of the time)    Monitoring >75% (most of the time)    Problem Solving and behavior change strategies  50 - 75 % (half of the time)    Reducing Risk (treating acute and chronic complications) 25 - 50% (sometimes)    Health Coping >75% (most of the time)      Post-Education Assessment   Patient understands the diabetes disease and treatment process. Demonstrates understanding / competency    Patient understands incorporating nutritional management into lifestyle. Comprehends key points    Patient undertands incorporating physical activity into lifestyle. Demonstrates understanding / competency    Patient understands using  medications safely. Demonstrates understanding / competency    Patient understands monitoring blood glucose, interpreting and using results Demonstrates understanding / competency    Patient understands prevention, detection, and treatment of acute complications. Demonstrates understanding / competency    Patient understands prevention, detection, and treatment of chronic complications. Demonstrates understanding / competency    Patient understands how to develop strategies to address psychosocial issues. Demonstrates understanding / competency    Patient understands how to develop strategies to promote health/change behavior. Comprehends key points      Outcomes   Expected Outcomes Demonstrated interest in learning. Expect positive outcomes    Future DMSE 3-4 months    Program Status Not Completed      Subsequent Visit   Since your last visit have you experienced any weight changes? Loss    Weight Loss (lbs) 1             Individualized Plan for Diabetes Self-Management Training:   Learning Objective:  Patient will have a greater understanding of diabetes self-management. Patient education plan is to attend individual and/or group sessions per assessed needs and concerns.   Plan:   Patient Instructions  Timing of the Evaristo Bury can be in the morning.  Increase your vegetable intake (1/2 plate) Lean protein, less red meat. Consider less milk. Mindfulness with alcohol.  Limit to 2 single shots and none is best based on your liver.    Expected Outcomes:  Demonstrated interest in learning. Expect positive outcomes  Education material provided:   If problems or questions, patient to contact team via:  Phone  Future DSME appointment: 3-4 months

## 2023-03-02 NOTE — Patient Instructions (Addendum)
Timing of the Theodore Bush can be in the morning.  Increase your vegetable intake (1/2 plate) Lean protein, less red meat. Consider less milk. Mindfulness with alcohol.  Limit to 2 single shots and none is best based on your liver.

## 2023-03-16 ENCOUNTER — Other Ambulatory Visit (INDEPENDENT_AMBULATORY_CARE_PROVIDER_SITE_OTHER): Payer: Medicare Other

## 2023-03-16 ENCOUNTER — Telehealth: Payer: Self-pay

## 2023-03-16 ENCOUNTER — Ambulatory Visit (INDEPENDENT_AMBULATORY_CARE_PROVIDER_SITE_OTHER): Payer: Medicare Other | Admitting: Gastroenterology

## 2023-03-16 ENCOUNTER — Encounter: Payer: Self-pay | Admitting: Gastroenterology

## 2023-03-16 VITALS — BP 120/70 | HR 57 | Ht 69.0 in | Wt 178.0 lb

## 2023-03-16 DIAGNOSIS — R933 Abnormal findings on diagnostic imaging of other parts of digestive tract: Secondary | ICD-10-CM

## 2023-03-16 DIAGNOSIS — I480 Paroxysmal atrial fibrillation: Secondary | ICD-10-CM | POA: Diagnosis not present

## 2023-03-16 DIAGNOSIS — Z7901 Long term (current) use of anticoagulants: Secondary | ICD-10-CM

## 2023-03-16 DIAGNOSIS — F1090 Alcohol use, unspecified, uncomplicated: Secondary | ICD-10-CM | POA: Diagnosis not present

## 2023-03-16 DIAGNOSIS — R9389 Abnormal findings on diagnostic imaging of other specified body structures: Secondary | ICD-10-CM

## 2023-03-16 DIAGNOSIS — Z86010 Personal history of colonic polyps: Secondary | ICD-10-CM

## 2023-03-16 DIAGNOSIS — I509 Heart failure, unspecified: Secondary | ICD-10-CM

## 2023-03-16 LAB — CBC WITH DIFFERENTIAL/PLATELET
Basophils Absolute: 0 10*3/uL (ref 0.0–0.1)
Basophils Relative: 0.4 % (ref 0.0–3.0)
Eosinophils Absolute: 0.2 10*3/uL (ref 0.0–0.7)
Eosinophils Relative: 4.6 % (ref 0.0–5.0)
HCT: 42.2 % (ref 39.0–52.0)
Hemoglobin: 14.2 g/dL (ref 13.0–17.0)
Lymphocytes Relative: 39.6 % (ref 12.0–46.0)
Lymphs Abs: 2.1 10*3/uL (ref 0.7–4.0)
MCHC: 33.7 g/dL (ref 30.0–36.0)
MCV: 91 fl (ref 78.0–100.0)
Monocytes Absolute: 0.5 10*3/uL (ref 0.1–1.0)
Monocytes Relative: 9 % (ref 3.0–12.0)
Neutro Abs: 2.4 10*3/uL (ref 1.4–7.7)
Neutrophils Relative %: 46.4 % (ref 43.0–77.0)
Platelets: 233 10*3/uL (ref 150.0–400.0)
RBC: 4.63 Mil/uL (ref 4.22–5.81)
RDW: 14.3 % (ref 11.5–15.5)
WBC: 5.2 10*3/uL (ref 4.0–10.5)

## 2023-03-16 LAB — PROTIME-INR
INR: 1.8 ratio — ABNORMAL HIGH (ref 0.8–1.0)
Prothrombin Time: 18.8 s — ABNORMAL HIGH (ref 9.6–13.1)

## 2023-03-16 NOTE — Telephone Encounter (Signed)
LVM to patient to return the call in regards to hold Xarelto 2 days prior to procedure.

## 2023-03-16 NOTE — Patient Instructions (Signed)
Your provider has requested that you go to the basement level for lab work before leaving today. Press "B" on the elevator. The lab is located at the first door on the left as you exit the elevator.   You have been scheduled for an abdominal ultrasound with elastography at [facility] (1st floor). Your appointment is scheduled for 03/23/23 at 1000 AM. Please arrive 30 minutes prior to your scheduled appointment for registration purposes. Make certain not to have anything to eat or drink after midnight to your procedure. Should you need to reschedule your appointment, you may contact radiology at [phone].  Liver Elastography Various chronic liver diseases such as hepatitis B, C, and fatty liver disease can lead to tissue damage and subsequent scar tissue formation. As the scar tissue accumulates, the liver loses some of its elasticity and becomes stiffer. Liver elastography involves the use of a surface ultrasound probe that delivers a low frequency pulse or shear wave to a small volume of liver tissue under the rib cage. The transmission of the sound wave is completely painless. How Is a Liver Elastography Performed? The liver is located in the right upper abdomen under the rib cage. Patients are asked to lie flat on an examination table. A technician places the FibroScan probe between the ribs on the right side of the lower chest wall. A series of 10 painless pulses are then applied to the liver. The results are recorded on the equipment and an overall liver stiffness score is generated. This score is then interpreted by a qualified physician to predict the likelihood of advanced fibrosis or cirrhosis.  Patients are asked to wear loose clothing and should not consume any liquids or solids for a minimum of 4 hours before the test to increase the likelihood of obtaining reliable test results. The scan will take 10 to 15 minutes to complete, but patients should plan on being available for 30  minutes to allow time for preparation   You will be contaced by our office prior to your procedure for directions on holding your Xarelto.  If you do not hear from our office 1 week prior to your scheduled procedure, please call [phone] to discuss.  _______________________________________________________  If your blood pressure at your visit was 140/90 or greater, please contact your primary care physician to follow up on this.  _______________________________________________________  If you are age 71 or older, your body mass index should be between 23-30. Your Body mass index is 26.29 kg/m. If this is out of the aforementioned range listed, please consider follow up with your Primary Care Provider.  ________________________________________________________  The [facility] providers would like to encourage you to use [facility] to communicate with providers for non-urgent requests or questions.  Due to long hold times on the telephone, sending your provider a message by [facility] may be a faster and more efficient way to get a response.  Please allow 48 business hours for a response.  Please remember that this is for non-urgent requests.  _______________________________________________________   Thank you for choosing me and [facility].  Vito Cirigliano, D.O.

## 2023-03-16 NOTE — Progress Notes (Signed)
Chief Complaint:    Abnormal liver finding on CT   HPI:     Patient is a 72 y.o. male with a history of A. fib (on Xarelto), CHF (EF 35-40%), HTN, OSA, HLD, diabetes, CKD 3, asthma, Psoriatic Arthritis/Psoriasis ([place]ira) presenting to the Gastroenterology Clinic for follow-up.  Was last seen in the office by me on 10/07/2021 to discuss ongoing polyp surveillance, and subsequently completed repeat colonoscopy in 12/2021 as outlined below.  Today, he presents to the GI clinic to discuss recent abnormal CT scan as outlined below.  He otherwise has no active issues or concerns.  Does have a history of heavy EtOH intake 6 drinks nightly (vodka) for many years.   - 01/20/2022: CT angio chest with incidentally noted fatty infiltration of the liver with possible calcified granuloma, mild nodularity and splenomegaly. - 02/09/2023: CT angio chest again showing nodular liver contour, calcified granulomas in the liver, and mild splenomegaly.  Partially visualized small volume ascites in the upper abdomen.  - 05/2022: INR 1.7 (on Xarelto), normal CBC including PLT 185.  Normal CMP.  Normal AFP - 02/16/2023: Normal liver enzymes including albumin 4.5, T. bili 1.1.    Follows in the Cardiology Clinic, Endocrinology Clinic, Nutrition Clinic, and with Dr. Cliffton Asters in CT surgery with continued surveillance of ascending thoracic aortic aneurysm.  Endoscopic History: - Colonoscopy (03/2020):9 polyps ranging 3-6 mm (path tubular adenomas:), 8 mm pedunculated rectosigmoid polyp (path: Tubular adenoma), 8 mm distal rectal polyp (path: Leiomyoma), Diverticulosis, internal hemorrhoids.  Normal TI.  Repeat in 1 year  - Colonoscopy (12/2021): 2 small 2-4 mm cecal adenomas, sigmoid diverticulosis, internal hemorrhoids.  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    Asthma    Atrial fibrillation (HCC) 09/02/2018   Atrial fibrillation (HCC)    CHF (congestive heart failure) (HCC)    Chronic kidney disease 06/11/2019   Cirrhosis (HCC)    Controlled diabetes mellitus type 2 with complications (HCC) 09/02/2018   Controlled type 2 diabetes mellitus with complication, with long-term current use of insulin (HCC) 09/02/2018   Coronary artery calcification 06/11/2019   Hyperlipidemia    Hypertension    Insomnia 06/11/2019   Pneumonia 2019   Sleep apnea    he was 280lb at the time   Staph infection    to elbow 15-85yrs ago   Type 2 diabetes mellitus with hyperglycemia, without long-term current use of insulin (HCC) 02/03/2022    Patient's surgical history, family medical history, social history, medications and allergies were all reviewed in Epic    Current Outpatient Medications  Medication Sig Dispense Refill   ACCU-CHEK GUIDE test strip USE TO TEST 1 TO 2 TIMES PER DAY 100 strip 3   Accu-Chek Softclix Lancets lancets USE TO TEST 1 TO 2 TIMES PER DAY 100 each 3   Adalimumab 40 MG/0.4ML PNKT Inject 1 each into the skin. 2 times a months.     albuterol (VENTOLIN HFA) 108 (90 Base) MCG/ACT inhaler INHALE 1 PUFF INTO THE LUNGS EVERY 6 HOURS AS NEEDED FOR WHEEZING OR SHORTNESS OF BREATH 18 g 0   atorvastatin (LIPITOR) 80 MG tablet TAKE 1 TABLET(80 MG) BY MOUTH DAILY 60 tablet 3   budesonide-formoterol (SYMBICORT) 160-4.5 MCG/ACT inhaler INHALE 2 PUFFS INTO THE LUNGS TWICE DAILY 10.2 g 6   carvedilol (COREG) 25  MG tablet TAKE 1 TABLET(25 MG) BY MOUTH TWICE DAILY 180 tablet 1   Continuous Glucose Sensor (FREESTYLE LIBRE 3 SENSOR) MISC by Does not apply route.     empagliflozin (JARDIANCE) 25 MG TABS tablet Take 1 tablet (25 mg total) by mouth daily. 90 tablet 2   ENTRESTO 24-26 MG TAKE 1 TABLET BY MOUTH TWICE DAILY 180 tablet 3   Fenofibrate 50 MG CAPS TAKE ONE TABLET BY MOUTH ONCE DAILY 90 capsule 1   HYDROcodone-acetaminophen (NORCO) 10-325 MG tablet Take  1 tablet by mouth every 6 (six) hours as needed.     insulin degludec (TRESIBA FLEXTOUCH) 100 UNIT/ML FlexTouch Pen Inject 20 Units into the skin daily. 15 mL 1   Insulin Pen Needle (PEN NEEDLES) 32G X 4 MM MISC 1 each by Other route daily. 100 each 2   metFORMIN (GLUCOPHAGE-XR) 500 MG 24 hr tablet TAKE 4 TABLETS(2000 MG) BY MOUTH DAILY WITH BREAKFAST 360 tablet 0   montelukast (SINGULAIR) 10 MG tablet TAKE 1 TABLET(10 MG) BY MOUTH AT BEDTIME 90 tablet 0   Multiple Vitamin (MULTIVITAMIN WITH MINERALS) TABS tablet Take 1 tablet by mouth daily.     repaglinide (PRANDIN) 1 MG tablet Take 1 tablet (1 mg total) by mouth 3 (three) times daily before meals. 90 tablet 1   rivaroxaban (XARELTO) 20 MG TABS tablet Take 1 tablet (20 mg total) by mouth daily with supper. 90 tablet 1   spironolactone (ALDACTONE) 25 MG tablet TAKE 1 TABLET(25 MG) BY MOUTH EVERY OTHER DAY 45 tablet 2   traZODone (DESYREL) 50 MG tablet TAKE ONE-HALF TO ONE TABLETS BY MOUTH AT BEDTIME AS NEEDED FOR SLEEP. 90 tablet 0   Vitamin D, Ergocalciferol, (DRISDOL) 1.25 MG (50000 UNIT) CAPS capsule Take 50,000 Units by mouth every 7 (seven) days.     Blood Glucose Monitoring Suppl (ACCU-CHEK GUIDE ME) w/Device KIT Test 1-2 times daily. Pt needs accu-chek meter. Dx. Ell.9 (Patient not taking: Reported on 03/16/2023) 1 kit 0   No current facility-administered medications for this visit.    Physical Exam:     BP 120/70   Pulse (!) 57   Ht 5\' 9"  (1.753 m)   Wt 178 lb (80.7 kg)   BMI 26.29 kg/m   GENERAL:  Pleasant male in NAD PSYCH: : Cooperative, normal affect CARDIAC:  RRR, no murmur heard, no peripheral edema PULM: Normal respiratory effort, lungs CTA bilaterally, no wheezing ABDOMEN:  Nondistended, soft, nontender. No obvious masses, no hepatomegaly,  normal bowel sounds SKIN:  turgor, no lesions seen Musculoskeletal:  Normal muscle tone, normal strength NEURO: Alert and oriented x 3, no focal neurologic deficits   IMPRESSION  and PLAN:    1) Abnormal imaging study 2) EtOH use disorder CT in 01/2022 and repeat CT in 01/2023 both notable for nodular appearing liver contour and calcified granulomas of the liver, along with mild splenomegaly.  Most recent CT also with small volume ascites.  Discussed CT findings with the patient at length today.  Discussed possibility of cirrhosis and portal hypertensive changes.  Does have elevated INR, but also in the setting of active Xarelto use.  Otherwise normal PLT, T. bili, albumin.  Plan for the following:  - Ultrasound elastography  - Repeat CBC and PT/INR today to evaluate for no serologic evidence of impaired hepatic synthetic function - EGD to evaluate for esophageal varices or other features of portal hypertensive changes - Depending on results, did discuss possibility of liver biopsy - Counseled on complete cessation of  all EtOH  3) Atrial fibrillation 4) Chronic anticoagulation 5) CHF with reduced EF (35-40%) - Cardiology clearance to proceed with EGD and Xarelto hold x 2 days -Hold Xarelto 2 days before procedure - will instruct when and how to resume after procedure. Low but real risk of cardiovascular event such as heart attack, stroke, embolism, thrombosis or ischemia/infarct of other organs off Xarelto explained and need to seek urgent help if this occurs. The patient consents to proceed. Will communicate by phone or EMR with patient's prescribing provider to confirm that holding Xarelto is reasonable in this case    6) History of colon polyps - Repeat colonoscopy in 2028 for ongoing polyp surveillance     The indications, risks, and benefits of EGD were explained to the patient in detail. Risks include but are not limited to bleeding, perforation, adverse reaction to medications, and cardiopulmonary compromise. Sequelae include but are not limited to the possibility of surgery, hospitalization, and mortality. The patient verbalized understanding and wished to  proceed. All questions answered, referred to scheduler. Further recommendations pending results of the exam.        Shellia Cleverly ,DO, FACG 03/16/2023, 1:27 PM

## 2023-03-16 NOTE — Telephone Encounter (Signed)
Patient with diagnosis of afib on Xarelto for anticoagulation.    Procedure: EGD Date of procedure: 04/27/23  CHA2DS2-VASc Score = 5  This indicates a 7.2% annual risk of stroke. The patient's score is based upon: CHF History: 1 HTN History: 1 Diabetes History: 1 Stroke History: 0 Vascular Disease History: 1 Age Score: 1 Gender Score: 0   CrCl 32mL/min Platelet count 233K  Per office protocol, patient can hold Xarelto for 2 days prior to procedure as requested.    **This guidance is not considered finalized until pre-operative APP has relayed final recommendations.**

## 2023-03-16 NOTE — Telephone Encounter (Signed)
[facility] Pre-operative Risk Assessment     Request for surgical clearance:     Endoscopy Procedure  What type of surgery is being performed?     EGD  When is this surgery scheduled?     04/27/23  What type of clearance is required ?   Pharmacy  Are there any medications that need to be held prior to surgery and how long? Xarelto for 2 days  Practice name and name of physician performing surgery?      Dr. Barron Alvine, [facility]  What is your office phone and fax number?      Phone- [phone]  Fax- [phone]  Anesthesia type (None, local, MAC, general) ?       MAC

## 2023-03-16 NOTE — Telephone Encounter (Signed)
Pharmacy please advise on holding Xarelto prior to EGD scheduled for 04/27/2023. Thank you.

## 2023-03-16 NOTE — Telephone Encounter (Signed)
   Patient Name: Theodore Bush  DOB: [birth year] MRN: [id number]  Primary Cardiologist: Peter Swaziland, MD  Clinical pharmacists have reviewed the patient's past medical history, labs, and current medications as part of preoperative protocol coverage. The following recommendations have been made:   Per office protocol, patient can hold Xarelto for 2 days prior to procedure as requested.   I will route this recommendation to the requesting party via Epic fax function and remove from pre-op pool.  Please call with questions.  Napoleon Form, Leodis Rains, NP 03/16/2023, 4:08 PM

## 2023-03-19 NOTE — Telephone Encounter (Signed)
Patient made aware to hold Xarelto 2 days prior procedure. Patient verbalized understanding.

## 2023-03-23 ENCOUNTER — Ambulatory Visit (HOSPITAL_COMMUNITY): Payer: Medicare Other

## 2023-03-30 ENCOUNTER — Ambulatory Visit (HOSPITAL_COMMUNITY)
Admission: RE | Admit: 2023-03-30 | Discharge: 2023-03-30 | Disposition: A | Discharge status: Home / Self Care | Payer: Medicare Other | Source: Ambulatory Visit | Attending: Gastroenterology | Admitting: Gastroenterology

## 2023-03-30 DIAGNOSIS — R933 Abnormal findings on diagnostic imaging of other parts of digestive tract: Secondary | ICD-10-CM | POA: Diagnosis not present

## 2023-04-02 ENCOUNTER — Other Ambulatory Visit: Payer: Self-pay | Admitting: Medical

## 2023-04-06 ENCOUNTER — Other Ambulatory Visit: Payer: Self-pay | Admitting: Medical

## 2023-04-06 ENCOUNTER — Other Ambulatory Visit: Payer: Self-pay | Admitting: Cardiology

## 2023-04-23 ENCOUNTER — Other Ambulatory Visit: Payer: Self-pay | Admitting: Medical

## 2023-04-23 NOTE — Telephone Encounter (Signed)
Is this okay to refill? 

## 2023-04-27 ENCOUNTER — Encounter: Payer: Medicare Other | Admitting: Gastroenterology

## 2023-05-07 ENCOUNTER — Other Ambulatory Visit: Payer: Self-pay | Admitting: Medical

## 2023-05-07 ENCOUNTER — Other Ambulatory Visit: Payer: Self-pay | Admitting: Cardiology

## 2023-05-11 ENCOUNTER — Other Ambulatory Visit: Payer: Medicare Other

## 2023-05-15 ENCOUNTER — Ambulatory Visit: Payer: Medicare Other | Admitting: Cardiology

## 2023-05-18 ENCOUNTER — Other Ambulatory Visit (INDEPENDENT_AMBULATORY_CARE_PROVIDER_SITE_OTHER): Payer: Medicare Other

## 2023-05-18 ENCOUNTER — Other Ambulatory Visit: Payer: Self-pay | Admitting: Endocrinology

## 2023-05-18 ENCOUNTER — Ambulatory Visit: Payer: Medicare Other | Admitting: Endocrinology

## 2023-05-18 DIAGNOSIS — E1165 Type 2 diabetes mellitus with hyperglycemia: Secondary | ICD-10-CM | POA: Diagnosis not present

## 2023-05-18 DIAGNOSIS — Z794 Long term (current) use of insulin: Secondary | ICD-10-CM

## 2023-05-18 DIAGNOSIS — E221 Hyperprolactinemia: Secondary | ICD-10-CM

## 2023-05-18 LAB — HEMOGLOBIN A1C: Hgb A1c MFr Bld: 7.1 % — ABNORMAL HIGH (ref 4.6–6.5)

## 2023-05-18 LAB — BASIC METABOLIC PANEL
BUN: 23 mg/dL (ref 6–23)
CO2: 33 mEq/L — ABNORMAL HIGH (ref 19–32)
Calcium: 9.4 mg/dL (ref 8.4–10.5)
Chloride: 100 mEq/L (ref 96–112)
Creatinine, Ser: 1.14 mg/dL (ref 0.40–1.50)
GFR: 64.87 mL/min (ref >60.00)
Glucose, Bld: 143 mg/dL — ABNORMAL HIGH (ref 70–99)
Potassium: 4.3 mEq/L (ref 3.5–5.1)
Sodium: 140 mEq/L (ref 135–145)

## 2023-05-19 LAB — PROLACTIN: Prolactin: 14 ng/mL (ref 2.0–18.0)

## 2023-05-22 ENCOUNTER — Other Ambulatory Visit: Payer: Self-pay | Admitting: Medical

## 2023-05-22 ENCOUNTER — Other Ambulatory Visit: Payer: Self-pay | Admitting: Endocrinology

## 2023-05-22 DIAGNOSIS — J4540 Moderate persistent asthma, uncomplicated: Secondary | ICD-10-CM

## 2023-05-25 ENCOUNTER — Ambulatory Visit: Payer: Medicare Other | Admitting: Endocrinology

## 2023-06-08 ENCOUNTER — Encounter: Payer: Self-pay | Admitting: Dietician

## 2023-06-08 ENCOUNTER — Encounter: Payer: Medicare Other | Attending: Medical | Admitting: Dietician

## 2023-06-08 VITALS — Wt 190.0 lb

## 2023-06-08 DIAGNOSIS — Z794 Long term (current) use of insulin: Secondary | ICD-10-CM

## 2023-06-08 DIAGNOSIS — Z713 Dietary counseling and surveillance: Secondary | ICD-10-CM | POA: Insufficient documentation

## 2023-06-08 DIAGNOSIS — E119 Type 2 diabetes mellitus without complications: Secondary | ICD-10-CM | POA: Insufficient documentation

## 2023-06-08 NOTE — Patient Instructions (Addendum)
Great job on changes made! Continue to watch your CGM for effects of what you eat on your blood glucose. Continue to stay active! Increase your vegetable intake (1/2 plate) Lean protein, less red meat. Consider less milk. Continue Mindfulness with alcohol

## 2023-06-08 NOTE — Progress Notes (Unsigned)
Diabetes Self-Management Education  Visit Type: Follow-up  Appt. Start Time: 0910 Appt. End Time: 0940  06/12/2023  [name], identified by name and date of birth, is a 72 y.o. male with a diagnosis of Diabetes:  .   ASSESSMENT Patient is here today alone.  He was last seen by this RD on 03/02/2023.  He has been decreasing his sweets and seeing how fruit affects him using his CGM.  Time in range has improved from 48% to 77% in the past 3 months. Still active and still works.  8,000-10,000 steps daily when he works.  History includes:  Type 2 diabetes (1995 and insulin since 2018), OSA (improved after weight loss - no C-pap), HLD, HTN, CKD, CHF, Cirrhosis, asthma, A-fib Medications include:  Jardiance, Metformin XR, Tresiba 22 units q am, trial of prandin with lunch and occasionally with dinner, spironolactone Labs noted to include:  A1C 7.1% 05/18/2023 decreased from 7.6% 02/16/2023. GFR 64  on 05/18/2023, Vitamin B-12 321 06/11/2019 and 682 on 12/22/2022. CGM:  FreeStyle Libre 3    CGM Results from download: 03/02/2023 06/08/2023  % Time CGM active:   96 %   (Goal >70%) 96%  Average glucose:   188 mg/dL for 14 days 782 X 14 days  Glucose management indicator:   7.8 % 6.7%  Time in range (70-180 mg/dL):   48 %   (Goal >95%) 77%  Time High (181-250 mg/dL):   37 %   (Goal < 62%) 20  Time Very High (>250 mg/dL):    15 %   (Goal < 5%) 1  Time Low (54-69 mg/dL):   0 %   (Goal <1%) 2  Time Very Low (<54 mg/dL):   0 %   (Goal <3%) 0    Weight hx: 70" 190 lbs 06/08/2023  (wanted to regain) 174 lbs 03/02/2023 182 lbs 02/16/2023 175 lbs 165 lbs (lost on GLP-1 drugs but stopped as he did not tolerate it) - did not feel well at this weight. "Lost muscle" 280 lbs highest adult weight Goal weight per patient about 180 lbs "want to gain muscle"   Patient lives with his wife.  She does most of the shopping and cooking.  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 Gets 10,000 steps per day  on work days. Has weights but does not use them.  Mows his own lawn.  Has lived locally for 3 years to be near family. 2 vodka per day.which he has reduced.  Weight 190 lb (86.2 kg). Body mass index is 28.06 kg/m.   Diabetes Self-Management Education - 06/12/23 0210       Visit Information   Visit Type Follow-up      Psychosocial Assessment   Patient Belief/Attitude about Diabetes Motivated to manage diabetes    Self-care barriers None    Self-management support Doctor's office    Other persons present Patient    Patient Concerns Nutrition/Meal planning;Glycemic Control    Special Needs None    Preferred Learning Style No preference indicated    Learning Readiness Ready    How often do you need to have someone help you when you read instructions, pamphlets, or other written materials from your doctor or pharmacy? 1 - Never      Pre-Education Assessment   Patient understands the diabetes disease and treatment process. Comprehends key points    Patient understands incorporating nutritional management into lifestyle. Needs Review    Patient undertands incorporating physical activity into lifestyle.  Comprehends key points    Patient understands using medications safely. Comprehends key points    Patient understands monitoring blood glucose, interpreting and using results Comprehends key points    Patient understands prevention, detection, and treatment of acute complications. Comprehends key points    Patient understands prevention, detection, and treatment of chronic complications. Compreheands key points    Patient understands how to develop strategies to address psychosocial issues. Comprehends key points    Patient understands how to develop strategies to promote health/change behavior. Comprehends key points      Complications   Last HgB A1C per patient/outside source 7.1 %   05/18/2023 decreased fom 7.6% 02/16/2023   How often do you check your blood sugar? > 4 times/day     Fasting Blood glucose range (mg/dL) 10-272    Postprandial Blood glucose range (mg/dL) 536-644    Number of hypoglycemic episodes per month 2    Can you tell when your blood sugar is low? Yes      Dietary Intake   Breakfast cheerios, banana, milk    Lunch han and cheese sandwich on [employer] bread, banana, orange, apple, nuts    Snack (afternoon) leftovers from [employer] fish, white sauce, noolees, salad with ranch    Snack (evening) nuts, sugar free canday    Beverage(s) flavored water (sugar ) 2 vodka shots, whol milk      Activity / Exercise   Activity / Exercise Type Light (walking / raking leaves)      Patient Education   Previous Diabetes Education Yes (please comment)   02/2023   Healthy Eating Meal options for control of blood glucose level and chronic complications.    Being Active Role of exercise on diabetes management, blood pressure control and cardiac health.    Medications Reviewed patients medication for diabetes, action, purpose, timing of dose and side effects.    Monitoring Taught/evaluated CGM (comment)    Acute complications Taught prevention, symptoms, and  treatment of hypoglycemia - the 15 rule.    Diabetes Stress and Support Identified and addressed patients feelings and concerns about diabetes;Worked with patient to identify barriers to care and solutions      Individualized Goals (developed by patient)   Nutrition General guidelines for healthy choices and portions discussed    Physical Activity Exercise 5-7 days per week;30 minutes per day    Medications take my medication as prescribed    Monitoring  Consistenly use CGM    Reducing Risk examine blood glucose patterns;do foot checks daily      Patient Self-Evaluation of Goals - Patient rates self as meeting previously set goals (% of time)   Nutrition >75% (most of the time)    Physical Activity >75% (most of the time)    Medications >75% (most of the time)    Monitoring >75% (most of the time)     Problem Solving and behavior change strategies  >75% (most of the time)    Reducing Risk (treating acute and chronic complications) >75% (most of the time)    Health Coping >75% (most of the time)      Post-Education Assessment   Patient understands the diabetes disease and treatment process. Demonstrates understanding / competency    Patient understands incorporating nutritional management into lifestyle. Comprehends key points    Patient undertands incorporating physical activity into lifestyle. Demonstrates understanding / competency    Patient understands using medications safely. Demonstrates understanding / competency    Patient understands  monitoring blood glucose, interpreting and using results Demonstrates understanding / competency    Patient understands prevention, detection, and treatment of acute complications. Demonstrates understanding / competency    Patient understands prevention, detection, and treatment of chronic complications. Demonstrates understanding / competency    Patient understands how to develop strategies to address psychosocial issues. Demonstrates understanding / competency    Patient understands how to develop strategies to promote health/change behavior. Comprehends key points      Outcomes   Expected Outcomes Demonstrated interest in learning. Expect positive outcomes    Future DMSE 6 months    Program Status Not Completed      Subsequent Visit   Since your last visit have you continued or begun to take your medications as prescribed? Yes    Since your last visit have you experienced any weight changes? Gain    Weight Gain (lbs) 16    Since your last visit, are you checking your blood glucose at least once a day? Yes             Individualized Plan for Diabetes Self-Management Training:   Learning Objective:  Patient will have a greater understanding of diabetes self-management. Patient education plan is to attend individual and/or group sessions  per assessed needs and concerns.   Plan:   Patient Instructions  Randie [employer] job on changes made! Continue to watch your CGM for effects of what you eat on your blood glucose. Continue to stay active! Increase your vegetable intake (1/2 plate) Lean protein, less red meat. Consider less milk. Continue Mindfulness with alcohol  Expected Outcomes:  Demonstrated interest in learning. Expect positive outcomes  Education material provided:   If problems or questions, patient to contact team via:  Phone  Future DSME appointment: 6 months

## 2023-06-12 ENCOUNTER — Other Ambulatory Visit: Payer: Self-pay | Admitting: Endocrinology

## 2023-06-12 DIAGNOSIS — E1165 Type 2 diabetes mellitus with hyperglycemia: Secondary | ICD-10-CM

## 2023-06-15 ENCOUNTER — Ambulatory Visit (INDEPENDENT_AMBULATORY_CARE_PROVIDER_SITE_OTHER): Payer: Medicare Other

## 2023-06-15 DIAGNOSIS — Z0000 Encounter for general adult medical examination without abnormal findings: Secondary | ICD-10-CM

## 2023-06-15 NOTE — Progress Notes (Signed)
Subjective:   Theodore Bush is a 73 y.o. male who presents for Medicare Annual/Subsequent preventive examination.  Visit Complete: Virtual  I connected with  Theodore Bush on 06/15/23 by a audio enabled telemedicine application and verified that I am speaking with the correct person using two identifiers.  Patient Location: Home  Provider Location: Office/Clinic  I discussed the limitations of evaluation and management by telemedicine. The patient expressed understanding and agreed to proceed.  Patient Medicare AWV questionnaire was completed by the patient on 06/15/2023; I have confirmed that all information answered by patient is correct and no changes since this date.  Because this visit was a virtual/telehealth visit, some criteria may be missing or patient reported. Any vitals not documented were not able to be obtained and vitals that have been documented are patient reported.   Cardiac Risk Factors include: advanced age (>50men, >29 women);diabetes mellitus;dyslipidemia;hypertension;male gender     Objective:    Today's Vitals   06/15/23 1029  PainSc: 7    There is no height or weight on file to calculate BMI.     06/15/2023   10:35 AM 12/01/2022   10:29 AM 09/02/2021    1:43 PM 07/01/2020    8:30 PM  Advanced Directives  Does Patient Have a Medical Advance Directive? Yes Yes Yes No  Type of Advance Directive Out of facility DNR (pink MOST or yellow form)  Out of facility DNR (pink MOST or yellow form)   Would patient like information on creating a medical advance directive?    No - Patient declined    Current Medications (verified) Outpatient Encounter Medications as of 06/15/2023  Medication Sig   ACCU-CHEK GUIDE test strip USE TO TEST 1 TO 2 TIMES PER DAY   Accu-Chek Softclix Lancets lancets USE TO TEST 1 TO 2 TIMES PER DAY   Adalimumab 40 MG/0.4ML PNKT Inject 1 each into the skin. 2 times a months.   albuterol (VENTOLIN HFA) 108 (90 Base) MCG/ACT inhaler INHALE  1 PUFF INTO THE LUNGS EVERY 6 HOURS AS NEEDED FOR WHEEZING OR SHORTNESS OF BREATH   atorvastatin (LIPITOR) 80 MG tablet TAKE 1 TABLET(80 MG) BY MOUTH DAILY   budesonide-formoterol (SYMBICORT) 160-4.5 MCG/ACT inhaler INHALE 2 PUFFS INTO THE LUNGS TWICE DAILY   carvedilol (COREG) 25 MG tablet TAKE 1 TABLET(25 MG) BY MOUTH TWICE DAILY   Continuous Glucose Sensor (FREESTYLE LIBRE 3 SENSOR) MISC by Does not apply route.   empagliflozin (JARDIANCE) 25 MG TABS tablet Take 1 tablet (25 mg total) by mouth daily.   ENTRESTO 24-26 MG TAKE 1 TABLET BY MOUTH TWICE DAILY   Fenofibrate 50 MG CAPS TAKE ONE TABLET BY MOUTH ONCE DAILY   HYDROcodone-acetaminophen (NORCO) 10-325 MG tablet Take 1 tablet by mouth every 6 (six) hours as needed.   insulin degludec (TRESIBA FLEXTOUCH) 100 UNIT/ML FlexTouch Pen Inject 20 Units into the skin daily.   Insulin Pen Needle (PEN NEEDLES) 32G X 4 MM MISC 1 each by Other route daily.   metFORMIN (GLUCOPHAGE-XR) 500 MG 24 hr tablet TAKE 4 TABLETS(2000 MG) BY MOUTH DAILY WITH BREAKFAST   montelukast (SINGULAIR) 10 MG tablet TAKE 1 TABLET(10 MG) BY MOUTH AT BEDTIME   Multiple Vitamin (MULTIVITAMIN WITH MINERALS) TABS tablet Take 1 tablet by mouth daily.   repaglinide (PRANDIN) 1 MG tablet TAKE 1 TABLET(1 MG) BY MOUTH THREE TIMES DAILY BEFORE MEALS   rivaroxaban (XARELTO) 20 MG TABS tablet Take 1 tablet (20 mg total) by mouth daily with supper.   spironolactone (  ALDACTONE) 25 MG tablet TAKE 1 TABLET(25 MG) BY MOUTH EVERY OTHER DAY   traZODone (DESYREL) 50 MG tablet TAKE ONE-HALF TO ONE TABLET BY MOUTH AT BEDTIME AS NEEDED FOR SLEEP.   Vitamin D, Ergocalciferol, (DRISDOL) 1.25 MG (50000 UNIT) CAPS capsule Take 50,000 Units by mouth every 7 (seven) days.   Blood Glucose Monitoring Suppl (ACCU-CHEK GUIDE ME) w/Device KIT Test 1-2 times daily. Pt needs accu-chek meter. Dx. Ell.9 (Patient not taking: Reported on 03/16/2023)   No facility-administered encounter medications on file as of  06/15/2023.    Allergies (verified) Quinolones   History: 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   Asthma    Atrial fibrillation (HCC) 09/02/2018   Atrial fibrillation (HCC)    CHF (congestive heart failure) (HCC)    Chronic kidney disease 06/11/2019   Cirrhosis (HCC)    Controlled diabetes mellitus type 2 with complications (HCC) 09/02/2018   Controlled type 2 diabetes mellitus with complication, with long-term current use of insulin (HCC) 09/02/2018   Coronary artery calcification 06/11/2019   Hyperlipidemia    Hypertension    Insomnia 06/11/2019   Pneumonia 2019   Sleep apnea    he was 280lb at the time   Staph infection    to elbow 15-67yrs ago   Type 2 diabetes mellitus with hyperglycemia, without long-term current use of insulin (HCC) 02/03/2022   Past Surgical History:  Procedure Laterality Date   COLONOSCOPY  12/2021   PALATE / UVULA BIOPSY / EXCISION     trimmed several times   Family History  Problem Relation Age of Onset   Lupus Mother    Hypertension Mother    Diabetes Father    Diabetes Paternal Grandmother    Colon cancer Neg Hx    Stomach cancer Neg Hx    Esophageal cancer Neg Hx    Rectal cancer Neg Hx    Social History   Socioeconomic History   Marital status: Married    Spouse name: Not on file   Number of children: 2   Years of education: Not on file   Highest education level: Not on file  Occupational History   Occupation: Retired    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   Sexual activity: Not on file  Other Topics Concern    Not on file  Social History Narrative   Lives with wife Theodore Bush.   Son and a daughter in [place].   Pipe fitter, still works.   05/2022.   Social Determinants of Health   Financial Resource Strain: Low Risk  (06/15/2023)   Overall Financial Resource Strain (CARDIA)    Difficulty of Paying Living Expenses: Not hard at all  Food Insecurity: No Food Insecurity (06/15/2023)   Hunger Vital Sign    Worried About Running Out of Food in the Last Year: Never true    Ran Out of Food in the Last Year: Never true  Transportation Needs: No Transportation Needs (06/15/2023)   PRAPARE - Transportation  Lack of Transportation (Medical): No    Lack of Transportation (Non-Medical): No  Physical Activity: Unknown (06/15/2023)   Exercise Vital Sign    Days of Exercise per Week: 4 days    Minutes of Exercise per Session: Patient declined  Stress: No Stress Concern Present (06/15/2023)   [employer] of Occupational Health - Occupational Stress Questionnaire    Feeling of Stress : Only a little  Social Connections: Unknown (06/15/2023)   Social Connection and Isolation Panel [NHANES]    Frequency of Communication with Friends and Family: Three times a week    Frequency of Social Gatherings with Friends and Family: Once a week    Attends Religious Services: Not on Marketing executive or Organizations: Patient declined    Attends Banker Meetings: Patient declined    Marital Status: Married    Tobacco Counseling Ready to quit: Not Answered Counseling given: Not Answered   Clinical Intake:  Pre-visit preparation completed: Yes  Pain : 0-10 Pain Score: 7  Pain Type: Chronic pain Pain Location: Back (shoulders) Pain Orientation: Lower Pain Descriptors / Indicators: Aching Pain Onset: More than a month ago Pain Frequency: Constant     Nutritional Risks: None Diabetes: Yes CBG done?: No Did pt. bring in CBG monitor from home?: No  How often do you need to have  someone help you when you read instructions, pamphlets, or other written materials from your doctor or pharmacy?: 1 - Never  Interpreter Needed?: No  Information entered by :: NAllen LPN   Activities of Daily Living    06/15/2023   10:28 AM  In your present state of health, do you have any difficulty performing the following activities:  Hearing? 1  Comment needs operation in right ear  Vision? 0  Difficulty concentrating or making decisions? 0  Walking or climbing stairs? 0  Dressing or bathing? 0  Doing errands, shopping? 0  Preparing Food and eating ? N  Using the Toilet? N  In the past six months, have you accidently leaked urine? N  Do you have problems with loss of bowel control? N  Managing your Medications? N  Managing your Finances? N  Housekeeping or managing your Housekeeping? N    Patient Care Team: Tysinger, Cleda Mccreedy as PCP - General (Family Medicine) [place], Peter M, MD as PCP - Cardiology (Cardiology) Reather Littler, MD (Inactive) as Consulting Physician (Endocrinology) [facility]  Indicate any recent Medical Services you may have received from other than [facility] providers in the past year (date may be approximate).     Assessment:   This is a routine wellness examination for Breckyn.  Hearing/Vision screen Hearing Screening - Comments:: Needs operation on right ear Vision Screening - Comments:: Regular eye exams, [facility]   Goals Addressed             This Visit's Progress    Patient Stated       06/15/2023, get  A1C under control       Depression Screen    06/15/2023   10:36 AM 12/01/2022   10:29 AM 06/09/2022    9:52 AM 02/03/2022    3:42 PM 09/02/2021    1:45 PM 06/10/2021   11:51 AM 05/02/2021   11:02 AM  PHQ 2/9 Scores  PHQ - 2 Score 0 0 0 0 0 0 0  PHQ- 9 Score 0          Fall Risk  06/15/2023   10:28 AM 12/01/2022   10:28 AM 06/09/2022    9:52 AM 09/02/2021    1:44 PM 09/01/2021    7:49 PM  Fall Risk    Falls in the past year? 0 0 0 0 0  Number falls in past yr: 0  0  0  Injury with Fall? 0  0  0  Risk for fall due to : Medication side effect  No Fall Risks Medication side effect   Follow up Falls prevention discussed;Falls evaluation completed  Falls evaluation completed Falls evaluation completed;Education provided;Falls prevention discussed     MEDICARE RISK AT HOME: Medicare Risk at Home Any stairs in or around the home?: Yes If so, are there any without handrails?: Yes Home free of loose throw rugs in walkways, pet beds, electrical cords, etc?: Yes Adequate lighting in your home to reduce risk of falls?: Yes Life alert?: Yes Use of a cane, walker or w/c?: No Grab bars in the bathroom?: Yes Shower chair or bench in shower?: No Elevated toilet seat or a handicapped toilet?: No  TIMED UP AND GO:  Was the test performed?  No    Cognitive Function:    03/07/2018   11:13 AM  MMSE - Mini Mental State Exam  Orientation to time 5  Orientation to Place 5  Registration 3  Attention/ Calculation 5  Recall 3  Language- name 2 objects 2  Language- repeat 1  Language- follow 3 step command 3  Language- read & follow direction 1  Write a sentence 1  Copy design 1  Total score 30        06/15/2023   10:37 AM 09/02/2021    1:46 PM  6CIT Screen  What Year? 0 points 0 points  What month? 0 points 0 points  What time? 0 points 0 points  Count back from 20 0 points 0 points  Months in reverse 0 points 0 points  Repeat phrase 4 points 8 points  Total Score 4 points 8 points    Immunizations Immunization History  Administered Date(s) Administered   Fluad Quad(high Dose 65+) 06/11/2019, 08/03/2020, 06/10/2021, 06/09/2022   Moderna SARS-COV2 Booster Vaccination 08/03/2020   Moderna Sars-Covid-2 Vaccination 12/15/2019, 01/15/2020, 08/03/2020   PNEUMOCOCCAL CONJUGATE-20 06/09/2022   Pfizer(Comirnaty)Fall Seasonal Vaccine 12 years and older 10/31/2019, 11/28/2019, 02/18/2020     TDAP status: Up to date  Flu Vaccine status: Due, Education has been provided regarding the importance of this vaccine. Advised may receive this vaccine at local pharmacy or Health Dept. Aware to provide a copy of the vaccination record if obtained from local pharmacy or Health Dept. Verbalized acceptance and understanding.  Pneumococcal vaccine status: Up to date  Covid-19 vaccine status: Information provided on how to obtain vaccines.   Qualifies for Shingles Vaccine? Yes   Zostavax completed No   Shingrix Completed?: No.    Education has been provided regarding the importance of this vaccine. Patient has been advised to call insurance company to determine out of pocket expense if they have not yet received this vaccine. Advised may also receive vaccine at local pharmacy or Health Dept. Verbalized acceptance and understanding.  Screening Tests Health Maintenance  Topic Date Due   DTaP/Tdap/Td (1 - Tdap) Never done   Zoster Vaccines- Shingrix (1 of 2) Never done   INFLUENZA VACCINE  04/19/2023   COVID-19 Vaccine (4 - 2023-24 season) 05/20/2023   FOOT EXAM  06/10/2023   OPHTHALMOLOGY EXAM  06/10/2023   HEMOGLOBIN A1C  11/16/2023  Diabetic kidney evaluation - Urine ACR  02/16/2024   Diabetic kidney evaluation - eGFR measurement  05/17/2024   Medicare Annual Wellness (AWV)  06/14/2024   Colonoscopy  01/07/2027   Pneumonia Vaccine 59+ Years old  Completed   Hepatitis C Screening  Completed   HPV VACCINES  Aged Out    Health Maintenance  Health Maintenance Due  Topic Date Due   DTaP/Tdap/Td (1 - Tdap) Never done   Zoster Vaccines- Shingrix (1 of 2) Never done   INFLUENZA VACCINE  04/19/2023   COVID-19 Vaccine (4 - 2023-24 season) 05/20/2023   FOOT EXAM  06/10/2023   OPHTHALMOLOGY EXAM  06/10/2023    Colorectal cancer screening: Type of screening: Colonoscopy. Completed 01/06/2022. Repeat every 5 years  Lung Cancer Screening: (Low Dose CT Chest recommended if Age 10-80  years, 20 pack-year currently smoking OR have quit w/in 15years.) does not qualify.   Lung Cancer Screening Referral: no  Additional Screening:  Hepatitis C Screening: does qualify; Completed 02/11/2020  Vision Screening: Recommended annual ophthalmology exams for early detection of glaucoma and other disorders of the eye. Is the patient up to date with their annual eye exam?  Yes  Who is the provider or what is the name of the office in which the patient attends annual eye exams? [facility] If pt is not established with a provider, would they like to be referred to a provider to establish care? No .   Dental Screening: Recommended annual dental exams for proper oral hygiene  Diabetic Foot Exam: Diabetic Foot Exam: Overdue, Pt has been advised about the importance in completing this exam. Pt is scheduled for diabetic foot exam on next appointment.  Community Resource Referral / Chronic Care Management: CRR required this visit?  No   CCM required this visit?  No     Plan:     I have personally reviewed and noted the following in the patient's chart:   Medical and social history Use of alcohol, tobacco or illicit drugs  Current medications and supplements including opioid prescriptions. Patient is currently taking opioid prescriptions. Information provided to patient regarding non-opioid alternatives. Patient advised to discuss non-opioid treatment plan with their provider. Functional ability and status Nutritional status Physical activity Advanced directives List of other physicians Hospitalizations, surgeries, and ER visits in previous 12 months Vitals Screenings to include cognitive, depression, and falls Referrals and appointments  In addition, I have reviewed and discussed with patient certain preventive protocols, quality metrics, and best practice recommendations. A written personalized care plan for preventive services as well as general preventive health  recommendations were provided to patient.     Barb Merino, LPN   5/40/9811   After Visit Summary: (MyChart) Due to this being a telephonic visit, the after visit summary with patients personalized plan was offered to patient via MyChart   Nurse Notes: none

## 2023-06-15 NOTE — Patient Instructions (Signed)
Theodore Bush , Thank you for taking time to come for your Medicare Wellness Visit. I appreciate your ongoing commitment to your health goals. Please review the following plan we discussed and let me know if I can assist you in the future.   Referrals/Orders/Follow-Ups/Clinician Recommendations: noneManaging Pain Without Opioids Opioids are strong medicines used to treat moderate to severe pain. For some people, especially those who have long-term (chronic) pain, opioids may not be the best choice for pain management due to: Side effects like nausea, constipation, and sleepiness. The risk of addiction (opioid use disorder). The longer you take opioids, the greater your risk of addiction. Pain that lasts for more than 3 months is called chronic pain. Managing chronic pain usually requires more than one approach and is often provided by a team of health care providers working together (multidisciplinary approach). Pain management may be done at a pain management center or pain clinic. How to manage pain without the use of opioids Use non-opioid medicines Non-opioid medicines for pain may include: Over-the-counter or prescription non-steroidal anti-inflammatory drugs (NSAIDs). These may be the first medicines used for pain. They work well for muscle and bone pain, and they reduce swelling. Acetaminophen. This over-the-counter medicine may work well for milder pain but not swelling. Antidepressants. These may be used to treat chronic pain. A certain type of antidepressant (tricyclics) is often used. These medicines are given in lower doses for pain than when used for depression. Anticonvulsants. These are usually used to treat seizures but may also reduce nerve (neuropathic) pain. Muscle relaxants. These relieve pain caused by sudden muscle tightening (spasms). You may also use a pain medicine that is applied to the skin as a patch, cream, or gel (topical analgesic), such as a numbing medicine. These may  cause fewer side effects than medicines taken by mouth. Do certain therapies as directed Some therapies can help with pain management. They include: Physical therapy. You will do exercises to gain strength and flexibility. A physical therapist may teach you exercises to move and stretch parts of your body that are weak, stiff, or painful. You can learn these exercises at physical therapy visits and practice them at home. Physical therapy may also involve: Massage. Heat wraps or applying heat or cold to affected areas. Electrical signals that interrupt pain signals (transcutaneous electrical nerve stimulation, TENS). Weak lasers that reduce pain and swelling (low-level laser therapy). Signals from your body that help you learn to regulate pain (biofeedback). Occupational therapy. This helps you to learn ways to function at home and work with less pain. Recreational therapy. This involves trying new activities or hobbies, such as a physical activity or drawing. Mental health therapy, including: Cognitive behavioral therapy (CBT). This helps you learn coping skills for dealing with pain. Acceptance and commitment therapy (ACT) to change the way you think and react to pain. Relaxation therapies, including muscle relaxation exercises and mindfulness-based stress reduction. Pain management counseling. This may be individual, family, or group counseling.  Receive medical treatments Medical treatments for pain management include: Nerve block injections. These may include a pain blocker and anti-inflammatory medicines. You may have injections: Near the spine to relieve chronic back or neck pain. Into joints to relieve back or joint pain. Into nerve areas that supply a painful area to relieve body pain. Into muscles (trigger point injections) to relieve some painful muscle conditions. A medical device placed near your spine to help block pain signals and relieve nerve pain or chronic back pain (spinal  cord stimulation  device). Acupuncture. Follow these instructions at home Medicines Take over-the-counter and prescription medicines only as told by your health care provider. If you are taking pain medicine, ask your health care providers about possible side effects to watch out for. Do not drive or use heavy machinery while taking prescription opioid pain medicine. Lifestyle  Do not use drugs or alcohol to reduce pain. If you drink alcohol, limit how much you have to: 0-1 drink a day for women who are not pregnant. 0-2 drinks a day for men. Know how much alcohol is in a drink. In the [place] one drink equals one 12 oz bottle of beer (355 mL), one 5 oz glass of wine (148 mL), or one 1 oz glass of hard liquor (44 mL). Do not use any products that contain nicotine or tobacco. These products include cigarettes, chewing tobacco, and vaping devices, such as e-cigarettes. If you need help quitting, ask your health care provider. Eat a healthy diet and maintain a healthy weight. Poor diet and excess weight may make pain worse. Eat foods that are high in fiber. These include fresh fruits and vegetables, whole grains, and beans. Limit foods that are high in fat and processed sugars, such as fried and sweet foods. Exercise regularly. Exercise lowers stress and may help relieve pain. Ask your health care provider what activities and exercises are safe for you. If your health care provider approves, join an exercise class that combines movement and stress reduction. Examples include yoga and tai chi. Get enough sleep. Lack of sleep may make pain worse. Lower stress as much as possible. Practice stress reduction techniques as told by your therapist. General instructions Work with all your pain management providers to find the treatments that work best for you. You are an important member of your pain management team. There are many things you can do to reduce pain on your own. Consider joining an online or  in-person support group for people who have chronic pain. Keep all follow-up visits. This is important. Where to find more information You can find more information about managing pain without opioids from: American Academy of Pain Medicine: painmed.org Institute for Chronic Pain: instituteforchronicpain.org American Chronic Pain Association: theacpa.org Contact a health care provider if: You have side effects from pain medicine. Your pain gets worse or does not get better with treatments or home therapy. You are struggling with anxiety or depression. Summary Many types of pain can be managed without opioids. Chronic pain may respond better to pain management without opioids. Pain is best managed when you and a team of health care providers work together. Pain management without opioids may include non-opioid medicines, medical treatments, physical therapy, mental health therapy, and lifestyle changes. Tell your health care providers if your pain gets worse or is not being managed well enough. This information is not intended to replace advice given to you by your health care provider. Make sure you discuss any questions you have with your health care provider. Document Revised: 12/15/2020 Document Reviewed: 12/15/2020 Elsevier Patient Education  2024 Elsevier Inc.   This is a list of the screening recommended for you and due dates:  Health Maintenance  Topic Date Due   DTaP/Tdap/Td vaccine (1 - Tdap) Never done   Zoster (Shingles) Vaccine (1 of 2) Never done   Flu Shot  04/19/2023   COVID-19 Vaccine (4 - 2023-24 season) 05/20/2023   Complete foot exam   06/10/2023   Eye exam for diabetics  06/10/2023   Hemoglobin A1C  11/16/2023   Yearly kidney health urinalysis for diabetes  02/16/2024   Yearly kidney function blood test for diabetes  05/17/2024   Medicare Annual Wellness Visit  06/14/2024   Colon Cancer Screening  01/07/2027   Pneumonia Vaccine  Completed   Hepatitis C  Screening  Completed   HPV Vaccine  Aged Out    Advanced directives: (In Chart) A copy of your advanced directives are scanned into your chart should your provider ever need it.  Next Medicare Annual Wellness Visit scheduled for next year: No, can only do Fridays and schedule is not open  insert Preventive Care attachment Insert FALL PREVENTION attachment if needed

## 2023-06-19 ENCOUNTER — Other Ambulatory Visit: Payer: Self-pay | Admitting: Medical

## 2023-06-21 ENCOUNTER — Encounter: Payer: Self-pay | Admitting: Internal Medicine

## 2023-06-22 LAB — LAB REPORT - SCANNED
A1c: 6.8
Creatinine, POC: 68.3 mg/dL
EGFR: 75

## 2023-06-22 NOTE — Progress Notes (Signed)
Cardiology Office Note   Date:  06/29/2023   ID:  Theodore Bush, DOB 1951-09-16, MRN [id number]  PCP:  Jac Canavan, PA-C  Cardiologist:   Aishah Teffeteller [place], MD   Chief Complaint  Patient presents with   Follow-up    6 months.   Atrial Fibrillation   Congestive Heart Failure      History of Present Illness: Theodore Bush is a 72 y.o. male who is seen for follow up  of CHF and history of Afib.  Patient moved to [facility]  to be closer to family. Moved from [facility]. He has a history of atrial fibrillation, CHF and CAD. He reports he has been admitted a couple of times for PNA and at one time had "fluid in the lungs" and required thoracentesis. He has a difficult time remembering when he was hospitalized or any details about prior cardiac work up. States he did have a stress test at some point in the past. We do have records of a chest CT done for a pulmonary nodule that showed severe coronary calcification. No prior history of MI. He has a history of AFib.  Initially on coumadin but later switched to Xarelto. 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 He has a history of DM on insulin, HTN, and HLD. Has CKD stage 3. Has a thoracic aortic aneurysm.   An Echo was obtained here in 2020 showing an EF of 35-40%.Global hypokinesis. Aortic root enlarged to 49 mm. CT chest done showing aneurysm of 4.7 cm. Coronary calcification. Scattered pulmonary nodules. Evidence of cirrhosis. Heart failure therapy has been optimized with Entresto, Jardiance, coreg, and aldactone. Repeat Echo in July 2023 showed no change. Last CT chest in May showed 4.9 thoracic aneurysm. Followed by Dr Cliffton Asters.  On follow up today he is doing well. 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 He gets 10K steps a day. HR has been well controlled. Did not tolerate Ozempic or monjouro. No edema. Weight stable.  No edema. Feels well overall. 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   Asthma    Atrial fibrillation (HCC) 09/02/2018   Atrial fibrillation (HCC)    CHF (congestive heart failure) (HCC)    Chronic kidney disease 06/11/2019   Cirrhosis (HCC)    Controlled diabetes mellitus type 2 with complications (HCC) 09/02/2018   Controlled type 2 diabetes mellitus with complication, with long-term current use of insulin (HCC) 09/02/2018   Coronary artery calcification 06/11/2019   Hyperlipidemia    Hypertension    Insomnia 06/11/2019   Pneumonia 2019   Sleep apnea    he was 280lb at the time   Staph infection    to elbow 15-38yrs ago   Type 2 diabetes mellitus with hyperglycemia, without long-term current use of insulin (HCC) 02/03/2022    Past Surgical History:  Procedure Laterality Date   COLONOSCOPY  12/2021   PALATE / UVULA BIOPSY / EXCISION     trimmed several times     Current Outpatient Medications  Medication Sig Dispense Refill   ACCU-CHEK GUIDE test strip USE TO TEST 1 TO 2 TIMES PER DAY 100 strip 3   Accu-Chek Softclix Lancets lancets USE TO TEST 1 TO 2 TIMES PER DAY 100 each 3   Adalimumab  40 MG/0.4ML PNKT Inject 1 each into the skin. 2 times a months.     albuterol (VENTOLIN HFA) 108 (90 Base) MCG/ACT inhaler INHALE 1 PUFF INTO THE LUNGS EVERY 6 HOURS AS NEEDED FOR WHEEZING OR SHORTNESS OF BREATH 18 g 0   atorvastatin (LIPITOR) 80 MG tablet TAKE 1 TABLET(80 MG) BY MOUTH DAILY 90 tablet 3   Blood Glucose Monitoring Suppl (ACCU-CHEK GUIDE ME) w/Device KIT Test 1-2 times daily. Pt needs accu-chek meter. Dx. Ell.9 1 kit 0   budesonide-formoterol (SYMBICORT) 160-4.5 MCG/ACT inhaler Inhale 2 puffs into the lungs 2 (two) times daily. 10.2 g 0   carvedilol (COREG) 25 MG tablet TAKE 1 TABLET(25 MG) BY MOUTH TWICE DAILY 180 tablet 1   Continuous Glucose Sensor (FREESTYLE LIBRE 3 SENSOR) MISC by Does not apply  route.     empagliflozin (JARDIANCE) 25 MG TABS tablet Take 1 tablet (25 mg total) by mouth daily. 90 tablet 2   ENTRESTO 24-26 MG TAKE 1 TABLET BY MOUTH TWICE DAILY 180 tablet 3   Fenofibrate 50 MG CAPS TAKE ONE TABLET BY MOUTH ONCE DAILY 90 capsule 0   HYDROcodone-acetaminophen (NORCO) 10-325 MG tablet Take 1 tablet by mouth every 6 (six) hours as needed.     insulin degludec (TRESIBA FLEXTOUCH) 100 UNIT/ML FlexTouch Pen Inject 20 Units into the skin daily. 15 mL 1   Insulin Pen Needle (PEN NEEDLES) 32G X 4 MM MISC 1 each by Other route daily. 100 each 2   metFORMIN (GLUCOPHAGE-XR) 500 MG 24 hr tablet TAKE 4 TABLETS(2000 MG) BY MOUTH DAILY WITH BREAKFAST 360 tablet 0   montelukast (SINGULAIR) 10 MG tablet TAKE 1 TABLET(10 MG) BY MOUTH AT BEDTIME 90 tablet 0   Multiple Vitamin (MULTIVITAMIN WITH MINERALS) TABS tablet Take 1 tablet by mouth daily.     repaglinide (PRANDIN) 1 MG tablet TAKE 1 TABLET(1 MG) BY MOUTH THREE TIMES DAILY BEFORE MEALS 270 tablet 3   rivaroxaban (XARELTO) 20 MG TABS tablet Take 1 tablet (20 mg total) by mouth daily with supper. 90 tablet 1   spironolactone (ALDACTONE) 25 MG tablet TAKE 1 TABLET(25 MG) BY MOUTH EVERY OTHER DAY 45 tablet 2   traZODone (DESYREL) 50 MG tablet TAKE ONE-HALF TO ONE TABLET BY MOUTH AT BEDTIME AS NEEDED FOR SLEEP. 90 tablet 0   Vitamin D, Ergocalciferol, (DRISDOL) 1.25 MG (50000 UNIT) CAPS capsule Take 50,000 Units by mouth every 7 (seven) days.     No current facility-administered medications for this visit.    Allergies:   Quinolones    Social History:  The patient  reports that he has never smoked. His smokeless tobacco use includes snuff. He reports current alcohol use of about 10.0 standard drinks of alcohol per week. He reports current drug use. Drug: Hydrocodone.   Family History:  The patient's family history includes Diabetes in his father and paternal grandmother; Hypertension in his mother; Lupus in his mother.    ROS:  Please  see the history of present illness.   Otherwise, review of systems are positive for none.   All other systems are reviewed and negative.    PHYSICAL EXAM: VS:  BP 126/78 (BP Location: Left Arm, Patient Position: Sitting, Cuff Size: Normal)   Pulse 64   Ht 5\' 9"  (1.753 m)   BMI 28.06 kg/m  , BMI Body mass index is 28.06 kg/m. GEN: Well nourished, well developed, in no acute distress  HEENT: normal  Neck: no JVD, carotid bruits, or masses Cardiac: IRRR; no  murmurs, rubs, or gallops,no edema  Respiratory:  Few scattered wheezes.  GI: soft, nontender, nondistended, + BS MS: no deformity or atrophy  Skin: warm and dry, no rash Neuro:  Strength and sensation are intact Psych: euthymic mood, full affect   EKG Interpretation Date/Time:  Friday June 29 2023 11:16:40 EDT Ventricular Rate:  64 PR Interval:    QRS Duration:  102 QT Interval:  434 QTC Calculation: 447 R Axis:   3  Text Interpretation: Atrial flutter Cannot rule out Anterior infarct , age undetermined No significant change since last tracing Confirmed by [place], Dilyn Smiles [phone]) on 06/29/2023 11:19:02 AM     Recent Labs: 12/22/2022: TSH 0.967 02/16/2023: ALT 32 03/16/2023: Hemoglobin 14.2; Platelets 233.0 05/18/2023: BUN 23; Creatinine, Ser 1.14; Potassium 4.3; Sodium 140    Lipid Panel    Component Value Date/Time   CHOL 107 12/22/2022 1113   TRIG 83 12/22/2022 1113   HDL 44 12/22/2022 1113   CHOLHDL 2.4 12/22/2022 1113   LDLCALC 46 12/22/2022 1113   LDLDIRECT 51.0 06/02/2022 1052      Wt Readings from Last 3 Encounters:  06/08/23 190 lb (86.2 kg)  03/16/23 178 lb (80.7 kg)  03/02/23 174 lb (78.9 kg)      Other studies Reviewed: Additional studies/ records that were reviewed today include:   Echo 06/26/19: IMPRESSIONS      1. Left ventricular ejection fraction, by visual estimation, is 35 to 40%. The left ventricle has moderately decreased function. Normal left ventricular size. There is mildly  increased left ventricular hypertrophy. Diffuse hypokinesis.  2. Left ventricular diastolic Doppler parameters are indeterminate pattern of LV diastolic filling. Rhythm uncertain.  3. Trivial pericardial effusion is present.  4. Moderate mitral annular calcification.  5. The mitral valve is normal in structure. Trace mitral valve regurgitation. No evidence of mitral stenosis.  6. The tricuspid valve is normal in structure. Tricuspid valve regurgitation is trivial.  7. The aortic valve is tricuspid Aortic valve regurgitation is mild by color flow Doppler. Mild to moderate aortic valve sclerosis/calcification without any evidence of aortic stenosis.  8. There is severe dilatation of the ascending aorta measuring 49 mm. Suggest MRA or CTA chest to further evaluate.  9. Global right ventricle has mildly reduced systolic function.The right ventricular size is normal. No increase in right ventricular wall thickness. 10. Left atrial size was moderately dilated. 11. Right atrial size was mildly dilated. 12. The inferior vena cava is normal in size with greater than 50% respiratory variability, suggesting right atrial pressure of 3 mmHg. 13. TR signal is inadequate for assessing pulmonary artery systolic pressure.   Chest CT 07/04/19: IMPRESSION: 1. No acute intrathoracic pathology. No CT evidence of aortic dissection. 2. Dilated ascending aorta measuring up to 4.7 cm in diameter. Ascending thoracic aortic aneurysm. Recommend semi-annual imaging followup by CTA or MRA and referral to cardiothoracic surgery if not already obtained. This recommendation follows 2010 ACCF/AHA/AATS/ACR/ASA/SCA/SCAI/SIR/STS/SVM Guidelines for the Diagnosis and Management of Patients With Thoracic Aortic Disease. Circulation. 2010; 121: D322-G254. Aortic aneurysm NOS (ICD10-I71.9) 3. Cardiomegaly with 3 vessel coronary vascular calcification. 4. Small pericardial effusion. 5. Several small pulmonary nodules measure up to 4  mm. No follow-up needed if patient is low-risk (and has no known or suspected primary neoplasm). Non-contrast chest CT can be considered in 12 months if patient is high-risk. This recommendation follows the consensus statement: Guidelines for Management of Incidental Pulmonary Nodules Detected on CT Images: From the Fleischner Society 2017; Radiology 2017; 284:228-243. 6. Cirrhosis.  CLINICAL DATA:  Follow-up thoracic aortic aneurysm.   EXAM: CT ANGIOGRAPHY CHEST WITH CONTRAST   TECHNIQUE: Multidetector CT imaging of the chest was performed using the standard protocol during bolus administration of intravenous contrast. Multiplanar CT image reconstructions and MIPs were obtained to evaluate the vascular anatomy.   CONTRAST:  OMNIPAQUE IOHEXOL 350 MG/ML SOLN   COMPARISON:  Chest CTA 07/04/2019   FINDINGS: Cardiovascular: Aortic root at the sinuses of Valsalva measures 4.5 cm. Sinotubular junction measures 3.4 cm. Fusiform aneurysm of the ascending thoracic aorta measures up to 4.9 cm on sequence 4, image 57 and this is unchanged from the prior examination. Coronary artery calcifications. Aortic arch measures 3.5 cm and stable. Great vessels are patent. Proximal vertebral arteries are patent. Incidentally, the left vertebral artery originates from the aortic arch near the origin of the left subclavian artery. Atherosclerotic calcifications involving the thoracic aorta. Descending thoracic aorta measures 3.3 cm and stable. Distal descending thoracic aorta measures 2.9 cm and stable. Negative for aortic dissection. Proximal abdominal aorta is patent with atherosclerotic disease. Variant celiac artery anatomy. There is a common trunk for the left gastric artery and splenic artery. Common hepatic artery originates directly from the abdominal aorta. SMA is widely patent at the origin. Bilateral renal arteries are patent with calcified plaque at the origin but no significant  stenosis. Mitral annular calcifications. Small amount of pericardial fluid. Main pulmonary artery is enlarged measuring up to 4.2 cm. Main and central pulmonary arteries are patent.   Mediastinum/Nodes: Thyroid tissue is unremarkable. No enlarged mediastinal or hilar lymph nodes. No enlarged axillary lymph nodes.   Lungs/Pleura: Filling defect along the posterior aspect of the upper trachea and a small linear filling defect extending into the right mainstem bronchus. These endobronchial filling defects likely represent adherent mucus. No significant pleural effusions. Stable tiny calcified granuloma in the anterior right upper lobe on sequence 6 image 59. Stable linear density in the right lower lobe on sequence 6, image 85. Focal volume loss and possible scarring along posterior aspect of the left upper lobe and lingular region. Stable tiny peripheral nodular density in the left lower lobe on sequence 6, image 116. No significant airspace disease or consolidation in the lungs.   Upper Abdomen: Chronic calcification in the right hepatic lobe. Liver contour is slightly nodular particularly along the anterior left hepatic lobe. No acute abnormality in the upper abdomen.   Musculoskeletal: Degenerative changes in lower cervical spine. Bridging osteophytes in thoracic spine.   Review of the MIP images confirms the above findings.   IMPRESSION: 1. 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 Recommend semi-annual imaging followup by CTA or MRA and referral to cardiothoracic surgery if not already obtained. This recommendation follows 2010 ACCF/AHA/AATS/ACR/ASA/SCA/SCAI/SIR/STS/SVM Guidelines for the Diagnosis and Management of Patients With Thoracic Aortic Disease. Circulation. 2010; 121: X096-E454. Aortic aneurysm NOS (ICD10-I71.9) 2. Liver contour is slightly nodular particularly in the anterior left hepatic lobe. Early cirrhotic changes cannot  be excluded. 3. Enlarged pulmonary arteries. Findings can be associated with pulmonary hypertension. 4. Aortic Atherosclerosis (ICD10-I70.0). Coronary artery calcifications.     Electronically Signed   By: Richarda Overlie M.D.   On: 07/08/2020 10:29  Echo 04/07/22: IMPRESSIONS     1. Left ventricular ejection fraction, by estimation, is 35 to 40%. Left  ventricular ejection fraction by 3D volume is 36 %. The left ventricle has  moderately decreased function. The left ventricle demonstrates global  hypokinesis. The left ventricular  internal cavity size was mildly dilated. There is mild concentric left  ventricular hypertrophy. Left ventricular diastolic parameters are  consistent with Grade II diastolic dysfunction (pseudonormalization).   2. Right ventricular systolic function is normal. The right ventricular  size is normal. There is mildly elevated pulmonary artery systolic  pressure. The estimated right ventricular systolic pressure is 44.5 mmHg.   3. Left atrial size was severely dilated.   4. Right atrial size was mild to moderately dilated.   5. A small pericardial effusion is present. The pericardial effusion is  circumferential. There is no evidence of cardiac tamponade.   6. The mitral valve is grossly normal. Mild to moderate mitral valve  regurgitation. The mean mitral valve gradient is 3.2 mmHg with average  heart rate of 60 bpm. Moderate mitral annular calcification.   7. Tricuspid valve regurgitation is mild to moderate.   8. The aortic valve is tricuspid. Aortic valve regurgitation is mild to  moderate. Aortic valve sclerosis is present, with no evidence of aortic  valve stenosis.   9. Pulmonic valve regurgitation is moderate.  10. Aortic dilatation noted. There is mild dilatation of the aortic root,  measuring 43 mm. There is severe dilatation of the ascending aorta,  measuring 51 mm.  11. The inferior vena cava is dilated in size with >50% respiratory  variability,  suggesting right atrial pressure of 8 mmHg.   Comparison(s): Further dilation of the ascending aorta.   Conclusion(s)/Recommendation(s): Has recent CT Aorta that correlates with  similar aortic size.   ASSESSMENT AND PLAN:  1.  CHF. Chronic systolic. EF 35-40%.  he is   well compensated on aldactone, Coreg, and Entresto. Also on Jardiance. Suspect LV dysfunction may be related to history of Etoh abuse. Recommend complete abstinence from Etoh.  Sodium restriction.  2. Atrial fibrillation- persistent/permanent. HR well controlled.  Continue Coreg and Xarelto.  3. CAD. Based on heavy coronary calcification on CT. No active angina. Apparent ischemic work up with stress testing at some point in the past.  4. HLD. On statin with excellent control 5. DM on insulin per primary care. On metformin and Jardiance. Last A1c 7.1%.  6. Asthma/COPD. on inhalers 7. CKD stage 3. Last creatinine 1.14  8. HTN well controlled.  9. Thoracic aortic aneurysm. 4.9 cm. Will need yearly CT. Marland Kitchen  Recommend avoidance of fluoroquinolone antibiotics. Followed by Dr Cliffton Asters.  10. History of Etoh abuse. Cirrhosis noted on CT. Recommend abstinence.      Current medicines are reviewed at length with the patient today.  The patient does not have concerns regarding medicines.  The following changes have been made:  no change  Labs/ tests ordered today include:   Orders Placed This Encounter  Procedures   EKG 12-Lead     Disposition:   FU  in 6 months  Signed, Erion Weightman [place], MD  06/29/2023 11:24 AM    [facility] [address] [place], [place], [address] Phone [phone], Fax [phone]

## 2023-06-28 ENCOUNTER — Telehealth: Payer: Self-pay | Admitting: Dietician

## 2023-06-28 ENCOUNTER — Other Ambulatory Visit: Payer: Self-pay

## 2023-06-28 ENCOUNTER — Other Ambulatory Visit: Payer: Self-pay | Admitting: Medical

## 2023-06-28 DIAGNOSIS — E1165 Type 2 diabetes mellitus with hyperglycemia: Secondary | ICD-10-CM

## 2023-06-28 MED ORDER — TRESIBA FLEXTOUCH 100 UNIT/ML ~~LOC~~ SOPN: 20.0000 [IU] | PEN_INJECTOR | Freq: Every day | SUBCUTANEOUS | 1 refills | Status: DC

## 2023-06-28 MED ORDER — BUDESONIDE-FORMOTEROL FUMARATE 160-4.5 MCG/ACT IN AERO: 2.0000 | INHALATION_SPRAY | Freq: Two times a day (BID) | RESPIRATORY_TRACT | 0 refills | Status: DC

## 2023-06-28 NOTE — Telephone Encounter (Signed)
Returned patient call. He needs a refill for [place] and Symbicort.   Chart reviewed.  He has an appointment with Dr. Erroll Luna in December and sees Crosby Oyster as well.  Will request the [facility] to send the Symbicort and will send a request for Tresiba to this office.  Message left for patient.  Oran Rein, RD, LDN, CDCES

## 2023-06-28 NOTE — Telephone Encounter (Signed)
Theodore Bush has been sent to  [id number] - [place], [place] - [address] AT [facility] [name]

## 2023-06-29 ENCOUNTER — Encounter: Payer: Self-pay | Admitting: Cardiology

## 2023-06-29 ENCOUNTER — Ambulatory Visit: Payer: Medicare Other | Attending: Cardiology | Admitting: Cardiology

## 2023-06-29 VITALS — BP 126/78 | HR 64 | Ht 69.0 in

## 2023-06-29 DIAGNOSIS — I480 Paroxysmal atrial fibrillation: Secondary | ICD-10-CM

## 2023-06-29 DIAGNOSIS — I7121 Aneurysm of the ascending aorta, without rupture: Secondary | ICD-10-CM | POA: Diagnosis not present

## 2023-06-29 DIAGNOSIS — I5022 Chronic systolic (congestive) heart failure: Secondary | ICD-10-CM

## 2023-06-29 DIAGNOSIS — I10 Essential (primary) hypertension: Secondary | ICD-10-CM

## 2023-06-29 NOTE — Patient Instructions (Signed)
Medication Instructions:  Continue taking medications *If you need a refill on your cardiac medications before your next appointment, please call your pharmacy*   Lab Work: none   Testing/Procedures: none   Follow-Up: At [facility], you and your health needs are our priority.  As part of our continuing mission to provide you with exceptional heart care, we have created designated Provider Care Teams.  These Care Teams include your primary Cardiologist (physician) and Advanced Practice Providers (APPs -  Physician Assistants and Nurse Practitioners) who all work together to provide you with the care you need, when you need it.  We recommend signing up for the patient portal called "MyChart".  Sign up information is provided on this After Visit Summary.  MyChart is used to connect with patients for Virtual Visits (Telemedicine).  Patients are able to view lab/test results, encounter notes, upcoming appointments, etc.  Non-urgent messages can be sent to your provider as well.   To learn more about what you can do with MyChart, go to [email or web address].    Your next appointment:   Follow up in 6 months  call in January to make an appointment for April   Provider:   [place]

## 2023-07-03 ENCOUNTER — Other Ambulatory Visit: Payer: Self-pay | Admitting: Cardiology

## 2023-07-03 ENCOUNTER — Other Ambulatory Visit: Payer: Self-pay | Admitting: Medical

## 2023-07-03 DIAGNOSIS — Z7901 Long term (current) use of anticoagulants: Secondary | ICD-10-CM

## 2023-07-03 DIAGNOSIS — I4819 Other persistent atrial fibrillation: Secondary | ICD-10-CM

## 2023-07-03 NOTE — Telephone Encounter (Signed)
Prescription refill request for Xarelto received.  Indication: PAF Last office visit: 06/29/23  P [place] MD Weight: 80.7kg Age: 72 Scr: 1.14 on 05/18/23  Epic CrCl: 67.84  Based on above findings Xarelto 20mg  daily is the appropriate dose.  Refill approved.

## 2023-07-05 ENCOUNTER — Other Ambulatory Visit: Payer: Self-pay | Admitting: Cardiology

## 2023-07-06 ENCOUNTER — Other Ambulatory Visit: Payer: Medicare Other

## 2023-07-13 ENCOUNTER — Other Ambulatory Visit: Payer: Self-pay | Admitting: Medical

## 2023-07-13 ENCOUNTER — Ambulatory Visit: Payer: Medicare Other | Admitting: Endocrinology

## 2023-07-13 ENCOUNTER — Ambulatory Visit (INDEPENDENT_AMBULATORY_CARE_PROVIDER_SITE_OTHER): Payer: Medicare Other | Admitting: Medical

## 2023-07-13 ENCOUNTER — Encounter: Payer: Self-pay | Admitting: Medical

## 2023-07-13 VITALS — BP 114/76 | HR 65 | Wt 187.6 lb

## 2023-07-13 DIAGNOSIS — D692 Other nonthrombocytopenic purpura: Secondary | ICD-10-CM

## 2023-07-13 DIAGNOSIS — Z0000 Encounter for general adult medical examination without abnormal findings: Secondary | ICD-10-CM

## 2023-07-13 DIAGNOSIS — E1165 Type 2 diabetes mellitus with hyperglycemia: Secondary | ICD-10-CM

## 2023-07-13 DIAGNOSIS — I2510 Atherosclerotic heart disease of native coronary artery without angina pectoris: Secondary | ICD-10-CM

## 2023-07-13 DIAGNOSIS — N1831 Chronic kidney disease, stage 3a: Secondary | ICD-10-CM

## 2023-07-13 DIAGNOSIS — F1010 Alcohol abuse, uncomplicated: Secondary | ICD-10-CM

## 2023-07-13 DIAGNOSIS — E782 Mixed hyperlipidemia: Secondary | ICD-10-CM

## 2023-07-13 DIAGNOSIS — J4540 Moderate persistent asthma, uncomplicated: Secondary | ICD-10-CM

## 2023-07-13 DIAGNOSIS — G4733 Obstructive sleep apnea (adult) (pediatric): Secondary | ICD-10-CM

## 2023-07-13 DIAGNOSIS — I10 Essential (primary) hypertension: Secondary | ICD-10-CM

## 2023-07-13 DIAGNOSIS — I480 Paroxysmal atrial fibrillation: Secondary | ICD-10-CM

## 2023-07-13 DIAGNOSIS — E559 Vitamin D deficiency, unspecified: Secondary | ICD-10-CM

## 2023-07-13 DIAGNOSIS — I509 Heart failure, unspecified: Secondary | ICD-10-CM | POA: Diagnosis not present

## 2023-07-13 DIAGNOSIS — G8929 Other chronic pain: Secondary | ICD-10-CM

## 2023-07-13 DIAGNOSIS — Z79899 Other long term (current) drug therapy: Secondary | ICD-10-CM

## 2023-07-13 DIAGNOSIS — Z7901 Long term (current) use of anticoagulants: Secondary | ICD-10-CM

## 2023-07-13 DIAGNOSIS — Z7185 Encounter for immunization safety counseling: Secondary | ICD-10-CM

## 2023-07-13 DIAGNOSIS — Z794 Long term (current) use of insulin: Secondary | ICD-10-CM

## 2023-07-13 DIAGNOSIS — L4059 Other psoriatic arthropathy: Secondary | ICD-10-CM

## 2023-07-13 NOTE — Progress Notes (Signed)
Subjective:    Theodore Bush is a 72 y.o. male who presents for well visit/med check  Chief Complaint  Patient presents with   Diabetes   Immunizations    Flu and covid   Alcohol Problem    Oar to help cease drinking     Primary Care Provider Keirra Zeimet, Kermit Balo, PA-C here for primary care  Current Health Care Team: Eye doctor, Dr. [place] eye Dr. Alben Deeds, rheumatology Dr. Reather Littler, endocrinology Dr. Peter [place], cardiology Dr. Brynda Greathouse, cardiothoracic surgery Dr. Doristine Locks, GI Dr. Virl Diamond, pulmonology   Still works 40 hours weekly  Exercise Current exercise habits:  working    Nutrition/Diet Current diet: in general, a "healthy" diet     Curious about medications that can help cut out alcohol.  Drinks a few drink per night but knows he needs to quit    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   Asthma    Atrial fibrillation (HCC) 09/02/2018   Atrial fibrillation (HCC)    CHF (congestive heart failure) (HCC)    Chronic kidney disease 06/11/2019   Cirrhosis (HCC)    Controlled diabetes mellitus type 2 with complications (HCC) 09/02/2018   Controlled type 2 diabetes mellitus with complication, with long-term current use of insulin (HCC) 09/02/2018   Coronary artery calcification 06/11/2019   Hyperlipidemia    Hypertension    Insomnia 06/11/2019   Pneumonia 2019   Sleep apnea    he was 280lb at the time   Staph infection    to elbow 15-59yrs ago   Type 2 diabetes mellitus with hyperglycemia, without long-term current use of insulin (HCC) 02/03/2022    Past Surgical History:  Procedure Laterality Date   COLONOSCOPY  12/2021   PALATE / UVULA BIOPSY / EXCISION     trimmed several times    Social History   Socioeconomic History   Marital status: Married    Spouse name: Not on file   Number of children: 2   Years of education: Not on file   Highest education level: Not on file   Occupational History   Occupation: Retired    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   Sexual activity: Not on file  Other Topics Concern   Not on file  Social History Narrative   Lives with wife Tammy.   Son and a daughter in [place].   Pipe fitter, still works.   05/2022.   Social Determinants of Health   Financial Resource Strain: Low Risk  (06/15/2023)   Overall Financial Resource Strain (CARDIA)    Difficulty of Paying Living Expenses: Not hard at all  Food Insecurity: No Food Insecurity (06/15/2023)   Hunger Vital Sign    Worried About Running Out of Food in the Last Year: Never true    Ran Out of Food in the Last Year: Never true  Transportation Needs: No Transportation Needs (06/15/2023)   PRAPARE - Administrator, Civil Service (Medical): No  Lack of Transportation (Non-Medical): No  Physical Activity: Unknown (06/15/2023)   Exercise Vital Sign    Days of Exercise per Week: 4 days    Minutes of Exercise per Session: Patient declined  Stress: No Stress Concern Present (06/15/2023)   [employer] of Occupational Health - Occupational Stress Questionnaire    Feeling of Stress : Only a little  Social Connections: Unknown (06/15/2023)   Social Connection and Isolation Panel [NHANES]    Frequency of Communication with Friends and Family: Three times a week    Frequency of Social Gatherings with Friends and Family: Once a week    Attends Religious Services: Not on file    Active Member of Clubs or Organizations: Patient declined    Attends Banker Meetings: Patient declined    Marital  Status: Married  Catering manager Violence: Not At Risk (06/15/2023)   Humiliation, Afraid, Rape, and Kick questionnaire    Fear of Current or Ex-Partner: No    Emotionally Abused: No    Physically Abused: No    Sexually Abused: No    Family History  Problem Relation Age of Onset   Lupus Mother    Hypertension Mother    Diabetes Father    Diabetes Paternal Grandmother    Colon cancer Neg Hx    Stomach cancer Neg Hx    Esophageal cancer Neg Hx    Rectal cancer Neg Hx      Current Outpatient Medications:    ACCU-CHEK GUIDE test strip, USE TO TEST 1 TO 2 TIMES PER DAY, Disp: 100 strip, Rfl: 3   Accu-Chek Softclix Lancets lancets, USE TO TEST 1 TO 2 TIMES PER DAY, Disp: 100 each, Rfl: 3   Adalimumab 40 MG/0.4ML PNKT, Inject 1 each into the skin. 2 times a months., Disp: , Rfl:    albuterol (VENTOLIN HFA) 108 (90 Base) MCG/ACT inhaler, INHALE 1 PUFF INTO THE LUNGS EVERY 6 HOURS AS NEEDED FOR WHEEZING OR SHORTNESS OF BREATH, Disp: 18 g, Rfl: 0   atorvastatin (LIPITOR) 80 MG tablet, TAKE 1 TABLET(80 MG) BY MOUTH DAILY, Disp: 90 tablet, Rfl: 3   Blood Glucose Monitoring Suppl (ACCU-CHEK GUIDE ME) w/Device KIT, Test 1-2 times daily. Pt needs accu-chek meter. Dx. Ell.9, Disp: 1 kit, Rfl: 0   budesonide-formoterol (SYMBICORT) 160-4.5 MCG/ACT inhaler, Inhale 2 puffs into the lungs 2 (two) times daily., Disp: 10.2 g, Rfl: 0   carvedilol (COREG) 25 MG tablet, TAKE 1 TABLET(25 MG) BY MOUTH TWICE DAILY, Disp: 180 tablet, Rfl: 3   Continuous Glucose Sensor (FREESTYLE LIBRE 3 SENSOR) MISC, by Does not apply route., Disp: , Rfl:    empagliflozin (JARDIANCE) 25 MG TABS tablet, Take 1 tablet (25 mg total) by mouth daily., Disp: 90 tablet, Rfl: 2   ENTRESTO 24-26 MG, TAKE 1 TABLET BY MOUTH TWICE DAILY, Disp: 180 tablet, Rfl: 3   Fenofibrate 50 MG CAPS, TAKE ONE TABLET BY MOUTH ONCE DAILY, Disp: 90 capsule, Rfl: 0   HYDROcodone-acetaminophen (NORCO) 10-325 MG tablet, Take 1 tablet by mouth every 6 (six)  hours as needed., Disp: , Rfl:    insulin degludec (TRESIBA FLEXTOUCH) 100 UNIT/ML FlexTouch Pen, Inject 20 Units into the skin daily., Disp: 15 mL, Rfl: 1   Insulin Pen Needle (PEN NEEDLES) 32G X 4 MM MISC, 1 each by Other route daily., Disp: 100 each, Rfl: 2   metFORMIN (GLUCOPHAGE-XR) 500 MG 24 hr tablet, TAKE 4 TABLETS(2000 MG) BY MOUTH DAILY WITH BREAKFAST, Disp: 360 tablet, Rfl: 0  montelukast (SINGULAIR) 10 MG tablet, TAKE 1 TABLET(10 MG) BY MOUTH AT BEDTIME, Disp: 90 tablet, Rfl: 0   Multiple Vitamin (MULTIVITAMIN WITH MINERALS) TABS tablet, Take 1 tablet by mouth daily., Disp: , Rfl:    repaglinide (PRANDIN) 1 MG tablet, TAKE 1 TABLET(1 MG) BY MOUTH THREE TIMES DAILY BEFORE MEALS, Disp: 270 tablet, Rfl: 3   spironolactone (ALDACTONE) 25 MG tablet, TAKE 1 TABLET(25 MG) BY MOUTH EVERY OTHER DAY, Disp: 45 tablet, Rfl: 2   traZODone (DESYREL) 50 MG tablet, TAKE ONE-HALF TO ONE TABLET BY MOUTH AT BEDTIME AS NEEDED FOR SLEEP., Disp: 90 tablet, Rfl: 0   Vitamin D, Ergocalciferol, (DRISDOL) 1.25 MG (50000 UNIT) CAPS capsule, Take 50,000 Units by mouth every 7 (seven) days., Disp: , Rfl:    XARELTO 20 MG TABS tablet, TAKE ONE BY MOUTH DAILY WITH DINNER, Disp: 90 tablet, Rfl: 1  Allergies  Allergen Reactions   Quinolones Other (See Comments)    History reviewed: allergies, current medications, past family history, past medical history, past social history, past surgical history and problem list    Objective:    BP 114/76   Pulse 65   Wt 187 lb 9.6 oz (85.1 kg)   SpO2 95%   BMI 27.70 kg/m   Wt Readings from Last 3 Encounters:  07/13/23 187 lb 9.6 oz (85.1 kg)  06/08/23 190 lb (86.2 kg)  03/16/23 178 lb (80.7 kg)   Gen: wd, wn, nad Skin: no worrisome lesions HEENT: normocephalic, sclerae anicteric,  dentures present Neck: supple, no lymphadenopathy, no thyromegaly, no masses, no bruits Heart: RRR, normal S1, S2, mild 2/6 murmur in upper sternal borders Lungs: CTA bilaterally, no  wheezes, rhonchi, or rales Abdomen: +bs, soft, non tender, non distended, no masses, no hepatomegaly, no splenomegaly Musculoskeletal: nontender, no swelling, no obvious deformity Extremities: no edema, no cyanosis, no clubbing Pulses: 2+ symmetric, upper and 1+ lower extremities, normal cap refill Neurological: alert, oriented x 3, CN2-12 intact, strength normal upper extremities and lower extremities, sensation normal throughout, DTRs 2+ throughout, no cerebellar signs, gait normal Psychiatric: normal affect, behavior normal, pleasant  GU/rectal - deferred    Diabetic Foot Exam - Simple   Simple Foot Form Diabetic Foot exam was performed with the following findings: Yes 07/13/2023 10:43 AM  Visual Inspection No deformities, no ulcerations, no other skin breakdown bilaterally: Yes Sensation Testing Intact to touch and monofilament testing bilaterally: Yes Pulse Check See comments: Yes Comments 1+ pedal pulses      Assessment:   Encounter Diagnoses  Name Primary?   Encounter for health maintenance examination in adult Yes   Paroxysmal atrial fibrillation (HCC)    Chronic anticoagulation    Chronic congestive heart failure, unspecified heart failure type (HCC)    Chronic kidney disease, stage 3a (HCC)    Coronary artery calcification    Essential hypertension    Primary hypertension    Mixed dyslipidemia    Moderate persistent asthma, unspecified whether complicated    OSA (obstructive sleep apnea)    Polyarticular psoriatic arthritis (HCC)    Type 2 diabetes mellitus with hyperglycemia, with long-term current use of insulin (HCC)    Senile purpura (HCC)    Vitamin D deficiency    High risk medication use    Alcohol abuse    Other chronic pain       Plan:    This visit was a preventative care visit, also known as wellness visit or routine physical.   Topics typically include healthy lifestyle, diet,  exercise, preventative care, vaccinations, sick and well care,  proper use of emergency dept and after hours care, as well as other concerns.     Recommendations: Continue to return yearly for your annual wellness and preventative care visits.  This gives [place] a chance to discuss healthy lifestyle, exercise, vaccinations, review your chart record, and perform screenings where appropriate.  I recommend you see your eye doctor yearly for routine vision care.  I recommend you see your dentist yearly for routine dental care including hygiene visits twice yearly.   Vaccination recommendations were reviewed Immunization History  Administered Date(s) Administered   Fluad Quad(high Dose 65+) 06/11/2019, 08/03/2020, 06/10/2021, 06/09/2022   Moderna SARS-COV2 Booster Vaccination 08/03/2020   Moderna Sars-Covid-2 Vaccination 12/15/2019, 01/15/2020, 08/03/2020   PNEUMOCOCCAL CONJUGATE-20 06/09/2022   Pfizer(Comirnaty)Fall Seasonal Vaccine 12 years and older 10/31/2019, 11/28/2019, 02/18/2020   Advised flu and covid vaccine  Get your Tdap and Shingrix at the pharmacy soon   Screening for cancer: Colon cancer screening: I reviewed your colonoscopy on file that is up to date from 12/2021  We discussed PSA, prostate exam, and prostate cancer screening risks/benefits.  Generally we stop doing PSA prostate cancer screening at age 28.  He would prefer to still screen.  Skin cancer screening: Check your skin regularly for new changes, growing lesions, or other lesions of concern Come in for evaluation if you have skin lesions of concern.  Lung cancer screening: If you have a greater than 20 pack year history of tobacco use, then you may qualify for lung cancer screening with a chest CT scan.   Please call your insurance company to inquire about coverage for this test.  We currently don't have screenings for other cancers besides breast, cervical, colon, and lung cancers.  If you have a strong family history of cancer or have other cancer screening concerns, please  let me know.    Bone health: Get at least 150 minutes of aerobic exercise weekly Get weight bearing exercise at least once weekly Bone density test:  A bone density test is an imaging test that uses a type of X-ray to measure the amount of calcium and other minerals in your bones. The test may be used to diagnose or screen you for a condition that causes weak or thin bones (osteoporosis), predict your risk for a broken bone (fracture), or determine how well your osteoporosis treatment is working. The bone density test is recommended for females 65 and older, or females or males <65 if certain risk factors such as thyroid disease, long term use of steroids such as for asthma or rheumatological issues, vitamin D deficiency, estrogen deficiency, family history of osteoporosis, self or family h disease seem to be with history of fragility fracture in first degree relative.   Consider a baseline bone density test.  Please call to schedule your bone density test.   [facility]  [phone] [address] [place], [place] [address]   Medical care options: I recommend you continue to seek care here first for routine care.  We try really hard to have available appointments Monday through Friday daytime hours for sick visits, acute visits, and physicals.  Urgent care should be used for after hours and weekends for significant issues that cannot wait till the next day.  The emergency department should be used for significant potentially life-threatening emergencies.  The emergency department is expensive, can often have long wait times for less significant concerns, so try to  utilize primary care, urgent care, or telemedicine when possible to avoid unnecessary trips to the emergency department.  Virtual visits and telemedicine have been introduced since the pandemic started in 2020, and can be convenient ways to receive medical care.  We offer virtual appointments  as well to assist you in a variety of options to seek medical care.   Advanced Directives: On file    Separate significant issues discussed: Cardiac: I reviewed his recent cardiology notes from 06/2023 CHF, chronic systolic, hypertension - EF 35 to 40%, continued on Aldactone, Coreg, Entresto and Jardiance.  They felt his left ventricle dysfunction related to alcohol abuse and advise he quit alcohol.  A-fib-persistent, permanent, continued on Coreg and Xarelto  CAD, heavy coronary calcification on CT, continued on statin  History of thoracic aortic aneurysm 4.9 cm, advise yearly CT, and recommended avoidance of fluoroquinolone antibiotics   Gastro: I reviewed his GI notes from June 2024 Given findings of nodular appearing liver contour and calcified granulomas in the liver and mild splenomegaly megaly, they felt like cirrhosis was a possibility along with portal hypertensive changes.  They are pursuing an ultrasound elastography, EGD to evaluate varices and discussed the possibility of liver biopsy.  They advised cessation of all alcohol  History of colon polyps with plan repeat colonoscopy 2028   Diabetes/Endo: I reviewed May 2024 endocrinology notes He was changed from Lantus to [place] 22 units, Jardiance 25 mg daily, metformin ER 2 g daily,   Thoracic Aneurysm: I reviewed his cardiothoracic May 2024 notes regarding thoracic aortic aneurysm 4.9 cm, advised semiannual imaging by CTA or MRA    Rheumatoid arthritis-sees rheumatology regularly, compliant with medications.  We will request records   Chronic pain - we will request records from [facility]   Primary insomnia-does fine on trazodone   Dyslipidemia-compliant with statin, compliant with fenofibrate.     CKD 3-updated labs today, avoid NSAIDs, hydrate well daily   History of mild cirrhosis of the liver-I recommend decreasing alcohol intake.  Sees gastroenterology.   Discussed alcohol  cessation.  Discussed possible medicaiton I can prescribe vs OAR treatment he inquired about through psychiatry . He doesn't want to add another medication but will pursue counseling.    Jorin "Kathlene November" was seen today for diabetes, immunizations and alcohol problem.  Diagnoses and all orders for this visit:  Encounter for health maintenance examination in adult -     Comprehensive metabolic panel -     Lipid panel -     CBC with Differential/Platelet -     VITAMIN D 25 Hydroxy (Vit-D Deficiency, Fractures) -     DG Bone Density; Future  Paroxysmal atrial fibrillation (HCC)  Chronic anticoagulation  Chronic congestive heart failure, unspecified heart failure type (HCC)  Chronic kidney disease, stage 3a (HCC)  Coronary artery calcification  Essential hypertension -     Comprehensive metabolic panel  Primary hypertension  Mixed dyslipidemia -     Lipid panel  Moderate persistent asthma, unspecified whether complicated  OSA (obstructive sleep apnea)  Polyarticular psoriatic arthritis (HCC)  Type 2 diabetes mellitus with hyperglycemia, with long-term current use of insulin (HCC)  Senile purpura (HCC)  Vitamin D deficiency -     VITAMIN D 25 Hydroxy (Vit-D Deficiency, Fractures)  High risk medication use -     DG Bone Density; Future  Alcohol abuse -     DG Bone Density; Future  Other chronic pain     Follow-up pending labs, yearly for physical

## 2023-07-13 NOTE — Patient Instructions (Addendum)
Counseling Services  [facility] Marval Regal [phone] [address] [place], [place] [address]   [facility] [place], counseling [address] [place], [place] [address] [phone]   [facility] [phone] [address],  [place], [place] [address]   Dr. Nicole Cella, [place] [phone] [place], [place] [address]   [facility] [address] [place], [place] [address] [phone]      Other Resources  If you are experiencing a mental health crisis or an emergency, please call 911 or go to the nearest emergency department.  [facility]   [phone] [facility]  [phone] [facility]   [phone]  Suicide Hotline 1-800-Suicide 360-306-0044)  National Suicide Prevention Lifeline 3650690713  (620) 122-8642)  Domestic Violence, Rape/Crisis - [facility] [place] 829-937-1696  [employer] Violence Hotline 1-800-799-SAFE (907)052-1075)  To report Child or Elder Abuse, please call: [facility] Police Department  [phone] [facility] Department  9202860816  Teen Crisis line 410-781-1096 or 340-173-7738      This visit was a preventative care visit, also known as wellness visit or routine physical.   Topics typically include healthy lifestyle, diet, exercise, preventative care, vaccinations, sick and well care, proper use of emergency dept and after hours care, as well as other concerns.     Recommendations: Continue to return yearly for your annual wellness and preventative care visits.  This gives [place] a chance to discuss healthy lifestyle, exercise, vaccinations, review your chart record, and perform screenings where appropriate.  I recommend you see your eye doctor yearly for routine vision care.  I recommend you see your dentist yearly for routine dental care including  hygiene visits twice yearly.   Vaccination recommendations were reviewed Immunization History  Administered Date(s) Administered   Fluad Quad(high Dose 65+) 06/11/2019, 08/03/2020, 06/10/2021, 06/09/2022   Moderna SARS-COV2 Booster Vaccination 08/03/2020   Moderna Sars-Covid-2 Vaccination 12/15/2019, 01/15/2020, 08/03/2020   PNEUMOCOCCAL CONJUGATE-20 06/09/2022   Pfizer(Comirnaty)Fall Seasonal Vaccine 12 years and older 10/31/2019, 11/28/2019, 02/18/2020   Counseled on the influenza virus vaccine.  Vaccine information sheet given.  Influenza vaccine given after consent obtained.  Counseled on the Covid virus vaccine.  Vaccine information sheet given.  Covid vaccine given after consent obtained.  Get your Tdap and Shingrix at the pharmacy soon   Screening for cancer: Colon cancer screening: I reviewed your colonoscopy on file that is up to date from 12/2021  We discussed PSA, prostate exam, and prostate cancer screening risks/benefits.  Generally we stop doing PSA prostate cancer screening at age 58.  He would prefer to still screen.  Skin cancer screening: Check your skin regularly for new changes, growing lesions, or other lesions of concern Come in for evaluation if you have skin lesions of concern.  Lung cancer screening: If you have a greater than 20 pack year history of tobacco use, then you may qualify for lung cancer screening with a chest CT scan.   Please call your insurance company to inquire about coverage for this test.  We currently don't have screenings for other cancers besides breast, cervical, colon, and lung cancers.  If you have a strong family history of cancer or have other cancer screening concerns, please let me know.    Bone health: Get at least 150 minutes of aerobic exercise weekly Get weight bearing exercise at least once weekly Bone density test:  A bone density test is an imaging  test that uses a type of X-ray to measure the amount of calcium and  other minerals in your bones. The test may be used to diagnose or screen you for a condition that causes weak or thin bones (osteoporosis), predict your risk for a broken bone (fracture), or determine how well your osteoporosis treatment is working. The bone density test is recommended for females 65 and older, or females or males <65 if certain risk factors such as thyroid disease, long term use of steroids such as for asthma or rheumatological issues, vitamin D deficiency, estrogen deficiency, family history of osteoporosis, self or family h disease seem to be with history of fragility fracture in first degree relative.   Consider a baseline bone density test.  Please call to schedule your bone density test.   [facility]  [phone] [address] [place], [place] [address]   Medical care options: I recommend you continue to seek care here first for routine care.  We try really hard to have available appointments Monday through Friday daytime hours for sick visits, acute visits, and physicals.  Urgent care should be used for after hours and weekends for significant issues that cannot wait till the next day.  The emergency department should be used for significant potentially life-threatening emergencies.  The emergency department is expensive, can often have long wait times for less significant concerns, so try to utilize primary care, urgent care, or telemedicine when possible to avoid unnecessary trips to the emergency department.  Virtual visits and telemedicine have been introduced since the pandemic started in 2020, and can be convenient ways to receive medical care.  We offer virtual appointments as well to assist you in a variety of options to seek medical care.   Advanced Directives: On file    Separate significant issues discussed: Cardiac: I reviewed his recent cardiology notes from 06/2023 CHF, chronic systolic, hypertension - EF 35  to 40%, continued on Aldactone, Coreg, Entresto and Jardiance.  They felt his left ventricle dysfunction related to alcohol abuse and advise he quit alcohol.  A-fib-persistent, permanent, continued on Coreg and Xarelto  CAD, heavy coronary calcification on CT, continued on statin  History of thoracic aortic aneurysm 4.9 cm, advise yearly CT, and recommended avoidance of fluoroquinolone antibiotics   Gastro: I reviewed his GI notes from June 2024 Given findings of nodular appearing liver contour and calcified granulomas in the liver and mild splenomegaly megaly, they felt like cirrhosis was a possibility along with portal hypertensive changes.  They are pursuing an ultrasound elastography, EGD to evaluate varices and discussed the possibility of liver biopsy.  They advised cessation of all alcohol  History of colon polyps with plan repeat colonoscopy 2028   Diabetes/Endo: I reviewed May 2024 endocrinology notes He was changed from Lantus to [place] 22 units, Jardiance 25 mg daily, metformin ER 2 g daily,   Thoracic Aneurysm: I reviewed his cardiothoracic May 2024 notes regarding thoracic aortic aneurysm 4.9 cm, advised semiannual imaging by CTA or MRA    Rheumatoid arthritis-sees rheumatology regularly, compliant with medications.  We will request records   Chronic pain - we will request records from [facility]   Primary insomnia-does fine on trazodone   Dyslipidemia-compliant with statin, compliant with fenofibrate.     CKD 3-updated labs today, avoid NSAIDs, hydrate well daily   History of mild cirrhosis of the liver-I recommend decreasing alcohol intake.  Sees gastroenterology.   Discussed alcohol cessation.  Discussed possible  medicaiton I can prescribe vs OAR treatment he inquired about through psychiatry . He doesn't want to add another medication but will pursue counseling.

## 2023-07-14 LAB — COMPREHENSIVE METABOLIC PANEL
ALT: 24 [IU]/L (ref 0–44)
AST: 34 IU/L (ref 0–40)
Albumin: 4.3 g/dL (ref 3.8–4.8)
Alkaline Phosphatase: 43 IU/L — ABNORMAL LOW (ref 44–121)
BUN/Creatinine Ratio: 22 (ref 10–24)
BUN: 24 mg/dL (ref 8–27)
Bilirubin Total: 0.8 mg/dL (ref 0.0–1.2)
CO2: 29 mmol/L (ref 20–29)
Calcium: 9.8 mg/dL (ref 8.6–10.2)
Chloride: 99 mmol/L (ref 96–106)
Creatinine, Ser: 1.07 mg/dL (ref 0.76–1.27)
Globulin, Total: 2.8 g/dL (ref 1.5–4.5)
Glucose: 114 mg/dL — ABNORMAL HIGH (ref 70–99)
Potassium: 5 mmol/L (ref 3.5–5.2)
Sodium: 141 mmol/L (ref 134–144)
Total Protein: 7.1 g/dL (ref 6.0–8.5)
eGFR: 74 mL/min/{1.73_m2} (ref >59)

## 2023-07-14 LAB — CBC WITH DIFFERENTIAL/PLATELET
Basophils Absolute: 0 10*3/uL (ref 0.0–0.2)
Basos: 0 %
EOS (ABSOLUTE): 0.2 10*3/uL (ref 0.0–0.4)
Eos: 5 %
Hematocrit: 45.4 % (ref 37.5–51.0)
Hemoglobin: 15.5 g/dL (ref 13.0–17.7)
Immature Grans (Abs): 0 10*3/uL (ref 0.0–0.1)
Immature Granulocytes: 0 %
Lymphocytes Absolute: 1.5 10*3/uL (ref 0.7–3.1)
Lymphs: 37 %
MCH: 31.6 pg (ref 26.6–33.0)
MCHC: 34.1 g/dL (ref 31.5–35.7)
MCV: 93 fL (ref 79–97)
Monocytes Absolute: 0.4 10*3/uL (ref 0.1–0.9)
Monocytes: 10 %
Neutrophils Absolute: 2 10*3/uL (ref 1.4–7.0)
Neutrophils: 48 %
Platelets: 157 10*3/uL (ref 150–450)
RBC: 4.91 x10E6/uL (ref 4.14–5.80)
RDW: 12.6 % (ref 11.6–15.4)
WBC: 4.1 10*3/uL (ref 3.4–10.8)

## 2023-07-14 LAB — LIPID PANEL
Chol/HDL Ratio: 2.2 ratio (ref 0.0–5.0)
Cholesterol, Total: 130 mg/dL (ref 100–199)
HDL: 59 mg/dL (ref >39)
LDL Chol Calc (NIH): 58 mg/dL (ref 0–99)
Triglycerides: 62 mg/dL (ref 0–149)
VLDL Cholesterol Cal: 13 mg/dL (ref 5–40)

## 2023-07-14 LAB — VITAMIN D 25 HYDROXY (VIT D DEFICIENCY, FRACTURES): Vit D, 25-Hydroxy: 93.2 ng/mL (ref 30.0–100.0)

## 2023-07-27 ENCOUNTER — Other Ambulatory Visit: Payer: Self-pay | Admitting: Medical

## 2023-07-27 DIAGNOSIS — J4540 Moderate persistent asthma, uncomplicated: Secondary | ICD-10-CM

## 2023-07-30 ENCOUNTER — Other Ambulatory Visit: Payer: Self-pay | Admitting: Medical

## 2023-08-15 ENCOUNTER — Other Ambulatory Visit: Payer: Self-pay

## 2023-08-15 DIAGNOSIS — E1165 Type 2 diabetes mellitus with hyperglycemia: Secondary | ICD-10-CM

## 2023-08-15 MED ORDER — EMPAGLIFLOZIN 25 MG PO TABS: 25.0000 mg | ORAL_TABLET | Freq: Every day | ORAL | 2 refills | Status: DC

## 2023-08-23 ENCOUNTER — Encounter: Payer: Self-pay | Admitting: Medical

## 2023-08-24 ENCOUNTER — Encounter: Payer: Self-pay | Admitting: Endocrinology

## 2023-08-24 ENCOUNTER — Ambulatory Visit (INDEPENDENT_AMBULATORY_CARE_PROVIDER_SITE_OTHER): Payer: Medicare Other | Admitting: Endocrinology

## 2023-08-24 VITALS — BP 136/70 | HR 97 | Resp 20 | Ht 69.0 in | Wt 186.0 lb

## 2023-08-24 DIAGNOSIS — E113393 Type 2 diabetes mellitus with moderate nonproliferative diabetic retinopathy without macular edema, bilateral: Secondary | ICD-10-CM | POA: Diagnosis not present

## 2023-08-24 DIAGNOSIS — E1165 Type 2 diabetes mellitus with hyperglycemia: Secondary | ICD-10-CM | POA: Diagnosis not present

## 2023-08-24 DIAGNOSIS — Z794 Long term (current) use of insulin: Secondary | ICD-10-CM

## 2023-08-24 LAB — POCT GLYCOSYLATED HEMOGLOBIN (HGB A1C): Hemoglobin A1C: 6.4 % — AB (ref 4.0–5.6)

## 2023-08-24 MED ORDER — EMPAGLIFLOZIN 25 MG PO TABS: 25.0000 mg | ORAL_TABLET | Freq: Every day | ORAL | 3 refills | Status: DC

## 2023-08-24 MED ORDER — REPAGLINIDE 1 MG PO TABS: 1.0000 mg | ORAL_TABLET | Freq: Three times a day (TID) | ORAL | 3 refills | Status: DC

## 2023-08-24 MED ORDER — METFORMIN HCL ER 500 MG PO TB24: 2000.0000 mg | ORAL_TABLET | Freq: Every day | ORAL | 3 refills | Status: DC

## 2023-08-24 MED ORDER — FREESTYLE LIBRE 3 PLUS SENSOR MISC: 1.0000 | 3 refills | Status: DC

## 2023-08-24 MED ORDER — TRESIBA FLEXTOUCH 100 UNIT/ML ~~LOC~~ SOPN: 20.0000 [IU] | PEN_INJECTOR | Freq: Every day | SUBCUTANEOUS | 3 refills | Status: DC

## 2023-08-24 NOTE — Patient Instructions (Signed)
Diabetes regimen: Decrease tresiba to 20 units daily. Take prandin/ repaglinide only with larger meals, may not take in case of increase physical activity or work.  Rest medications same.

## 2023-08-24 NOTE — Progress Notes (Signed)
Outpatient Endocrinology Note [place] Dellas Guard, MD  08/24/23  Patient's Name: Theodore Bush    DOB: [birth year]    MRN: [id number]                                                    REASON OF VISIT: Follow up for type 2 diabetes mellitus  PCP: Jac Canavan, PA-C  HISTORY OF PRESENT ILLNESS:   Theodore Bush is a 72 y.o. old male with past medical history listed below, is here for follow up for type 2 diabetes mellitus.   Pertinent Diabetes History: Patient was previously seen by Dr. Lucianne Muss and was last time seen in May 2024.  Patient was diagnosed with type 2 diabetes mellitus in 1995.  He has been on insulin therapy since 2018.  Chronic Diabetes Complications : Retinopathy: yes, moderate non proliferative diabetic retinopathy, following with ophthalmology regularly. Every 6 months, Dr. Dione Booze.  Nephropathy: Microalbuminuria, on Jardiance Rennis Petty. Peripheral neuropathy: no Coronary artery disease: yes Stroke: no  Relevant comorbidities and cardiovascular risk factors: Obesity: no Body mass index is 27.47 kg/m.  Hypertension: Yes  Hyperlipidemia : Yes, on statin   Current / Home Diabetic regimen includes:  Non-insulin hypoglycemic drugs:  metformin XR 2000 mg daily, Jardiance 25 mg daily.  Prandin /repaglinide 1 mg with meals three times a day.  Prandin was started in May 2024.     Insulin regimen: Tresiba 22 units once a day  in the morning.      Prior diabetic medications: [place] Pepper was started in 2018.  He had been on Januvia, linagliptin in the past.  He had nausea and vomiting with 5 mg of Mounjaro and Ozempic 0.25 mg weekly.  He reported significant weight loss with Ozempic.  Glycemic data:    CONTINUOUS GLUCOSE MONITORING SYSTEM (CGMS) INTERPRETATION: At today's visit, we reviewed CGM downloads. The full report is scanned in the media. Reviewing the CGM trends, blood glucose are as follows:  FreeStyle Libre 3+ CGM-  Sensor Download (Sensor download was reviewed  and summarized below.) Dates: November 23 to August 24, 2023, 14 days Sensor Average: 146  Glucose Management Indicator: 6.8% Glucose Variability: 32.6%  % data captured: 91%  Glycemic Trends:  <54: 0% 54-70: 1% 71-180: 75% 181-250: 21% 251-400: 3%   Interpretation: -Mostly acceptable blood sugar.  He had occasional hypoglycemia with blood sugar in 60s after meals probably related with Prandin and increase physical activity and rarely in the early morning, related with basal insulin.  Rare hypoglycemia with blood sugar up to 300 related with meals.  Most of the other times overnight and in between the meals are acceptable.  Hypoglycemia: Patient has minor hypoglycemic episodes. Patient has hypoglycemia awareness.  Factors modifying glucose control: 1.  Diabetic diet assessment: 3 meals a day.  2.  Staying active or exercising: Busy at work, is still working in Holiday representative.  3.  Medication compliance: compliant all of the time.  Interval history  Diabetes regimen as noted above.  Patient has been taking Prandin started in last visit in May.  Patient has occasional hypoglycemia seem to be related with taking Prandin with his meals and increase physical activity at that time.  He has no other complaints today.  REVIEW OF SYSTEMS As per history of present illness.   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   Asthma    Atrial fibrillation (HCC) 09/02/2018   Atrial fibrillation (HCC)    CHF (congestive heart failure) (HCC)    Chronic kidney disease 06/11/2019   Cirrhosis (HCC)    Controlled diabetes mellitus type 2 with complications (HCC) 09/02/2018   Controlled type 2 diabetes mellitus with complication, with long-term current use of insulin (HCC) 09/02/2018   Coronary artery calcification 06/11/2019   Hyperlipidemia    Hypertension    Insomnia 06/11/2019   Pneumonia 2019   Sleep apnea    he was 280lb  at the time   Staph infection    to elbow 15-25yrs ago   Type 2 diabetes mellitus with hyperglycemia, without long-term current use of insulin (HCC) 02/03/2022    PAST SURGICAL HISTORY: Past Surgical History:  Procedure Laterality Date   COLONOSCOPY  12/2021   PALATE / UVULA BIOPSY / EXCISION     trimmed several times    ALLERGIES: Allergies  Allergen Reactions   Quinolones Other (See Comments)    FAMILY HISTORY:  Family History  Problem Relation Age of Onset   Lupus Mother    Hypertension Mother    Diabetes Father    Diabetes Paternal Grandmother    Colon cancer Neg Hx    Stomach cancer Neg Hx    Esophageal cancer Neg Hx    Rectal cancer Neg Hx     SOCIAL HISTORY: Social History   Socioeconomic History   Marital status: Married    Spouse name: Not on file   Number of children: 2   Years of education: Not on file   Highest education level: Not on file  Occupational History   Occupation: Retired    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   Sexual activity: Not on file  Other Topics Concern   Not on file  Social History Narrative   Lives with wife Tammy.   Son and a daughter in [place].   Pipe fitter, still works.   05/2022.   Social Determinants of Health   Financial Resource Strain: Low Risk  (06/15/2023)   Overall Financial Resource Strain (CARDIA)    Difficulty of Paying Living Expenses: Not hard at all  Food Insecurity: No Food Insecurity (06/15/2023)   Hunger Vital Sign    Worried About Running Out of Food in the Last Year: Never true    Ran Out of Food in the Last Year: Never true   Transportation Needs: No Transportation Needs (06/15/2023)   PRAPARE - Administrator, Civil Service (Medical): No    Lack of Transportation (Non-Medical): No  Physical Activity: Unknown (06/15/2023)   Exercise Vital Sign    Days of Exercise per Week: 4 days    Minutes of Exercise per Session: Patient declined  Stress: No Stress Concern Present (06/15/2023)   [employer] of Occupational Health - Occupational Stress Questionnaire    Feeling of Stress : Only a little  Social Connections: Unknown (06/15/2023)   Social Connection and Isolation Panel [  NHANES]    Frequency of Communication with Friends and Family: Three times a week    Frequency of Social Gatherings with Friends and Family: Once a week    Attends Religious Services: Not on Marketing executive or Organizations: Patient declined    Attends Banker Meetings: Patient declined    Marital Status: Married    MEDICATIONS:  Current Outpatient Medications  Medication Sig Dispense Refill   Continuous Glucose Sensor (FREESTYLE LIBRE 3 PLUS SENSOR) MISC 1 each by Does not apply route continuous. Change every 15 days. 6 each 3   ACCU-CHEK GUIDE test strip USE TO TEST 1 TO 2 TIMES PER DAY 100 strip 3   Accu-Chek Softclix Lancets lancets USE TO TEST 1 TO 2 TIMES PER DAY 100 each 3   Adalimumab 40 MG/0.4ML PNKT Inject 1 each into the skin. 2 times a months.     albuterol (VENTOLIN HFA) 108 (90 Base) MCG/ACT inhaler INHALE 1 PUFF INTO THE LUNGS EVERY 6 HOURS AS NEEDED FOR WHEEZING OR SHORTNESS OF BREATH 18 g 0   atorvastatin (LIPITOR) 80 MG tablet TAKE 1 TABLET(80 MG) BY MOUTH DAILY 90 tablet 3   Blood Glucose Monitoring Suppl (ACCU-CHEK GUIDE ME) w/Device KIT Test 1-2 times daily. Pt needs accu-chek meter. Dx. Ell.9 1 kit 0   budesonide-formoterol (SYMBICORT) 160-4.5 MCG/ACT inhaler Inhale 2 puffs into the lungs 2 (two) times daily. 10.2 g 0   carvedilol (COREG) 25 MG tablet TAKE 1 TABLET(25 MG) BY  MOUTH TWICE DAILY 180 tablet 3   Continuous Glucose Sensor (FREESTYLE LIBRE 3 SENSOR) MISC by Does not apply route.     empagliflozin (JARDIANCE) 25 MG TABS tablet Take 1 tablet (25 mg total) by mouth daily. 90 tablet 3   ENTRESTO 24-26 MG TAKE 1 TABLET BY MOUTH TWICE DAILY 180 tablet 3   Fenofibrate 50 MG CAPS TAKE ONE TABLET BY MOUTH ONCE DAILY 90 capsule 0   HYDROcodone-acetaminophen (NORCO) 10-325 MG tablet Take 1 tablet by mouth every 6 (six) hours as needed.     insulin degludec (TRESIBA FLEXTOUCH) 100 UNIT/ML FlexTouch Pen Inject 20 Units into the skin daily. 15 mL 3   Insulin Pen Needle (PEN NEEDLES) 32G X 4 MM MISC 1 each by Other route daily. 100 each 2   metFORMIN (GLUCOPHAGE-XR) 500 MG 24 hr tablet Take 4 tablets (2,000 mg total) by mouth daily with breakfast. 360 tablet 3   montelukast (SINGULAIR) 10 MG tablet TAKE 1 TABLET(10 MG) BY MOUTH AT BEDTIME 90 tablet 0   Multiple Vitamin (MULTIVITAMIN WITH MINERALS) TABS tablet Take 1 tablet by mouth daily.     repaglinide (PRANDIN) 1 MG tablet Take 1 tablet (1 mg total) by mouth 3 (three) times daily before meals. 270 tablet 3   spironolactone (ALDACTONE) 25 MG tablet TAKE 1 TABLET(25 MG) BY MOUTH EVERY OTHER DAY 45 tablet 2   traZODone (DESYREL) 50 MG tablet TAKE ONE-HALF TO ONE TABLET BY MOUTH AT BEDTIME AS NEEDED FOR SLEEP. 90 tablet 0   Vitamin D, Ergocalciferol, (DRISDOL) 1.25 MG (50000 UNIT) CAPS capsule Take 50,000 Units by mouth every 7 (seven) days.     XARELTO 20 MG TABS tablet TAKE ONE BY MOUTH DAILY WITH DINNER 90 tablet 1   No current facility-administered medications for this visit.    PHYSICAL EXAM: Vitals:   08/24/23 0902 08/24/23 0904  BP: (!) 142/80 136/70  Pulse: 97   Resp: 20   SpO2: 97%  Weight: 186 lb (84.4 kg)   Height: 5\' 9"  (1.753 m)    Body mass index is 27.47 kg/m.  Wt Readings from Last 3 Encounters:  08/24/23 186 lb (84.4 kg)  07/13/23 187 lb 9.6 oz (85.1 kg)  06/08/23 190 lb (86.2 kg)     General: Well developed, well nourished male in no apparent distress.  HEENT: AT/, no external lesions.  Eyes: Conjunctiva clear and no icterus. Neck: Neck supple  Lungs: Respirations not labored Neurologic: Alert, oriented, normal speech Extremities / Skin: Dry.  Psychiatric: Does not appear depressed or anxious  Diabetic Foot Exam - Simple   No data filed     LABS Reviewed Lab Results  Component Value Date   HGBA1C 6.4 (A) 08/24/2023   HGBA1C 7.1 (H) 05/18/2023   HGBA1C 7.6 (A) 02/16/2023   No results found for: "FRUCTOSAMINE" Lab Results  Component Value Date   CHOL 130 07/13/2023   HDL 59 07/13/2023   LDLCALC 58 07/13/2023   LDLDIRECT 51.0 06/02/2022   TRIG 62 07/13/2023   CHOLHDL 2.2 07/13/2023   Lab Results  Component Value Date   MICRALBCREAT 90.6 (H) 02/16/2023   MICRALBCREAT 5.2 03/10/2022   Lab Results  Component Value Date   CREATININE 1.07 07/13/2023   Lab Results  Component Value Date   GFR 64.87 05/18/2023    ASSESSMENT / PLAN  1. Controlled type 2 diabetes mellitus with moderate nonproliferative retinopathy of both eyes, without long-term current use of insulin, macular edema presence unspecified (HCC)   2. Type 2 diabetes mellitus with hyperglycemia, with long-term current use of insulin (HCC)     Diabetes Mellitus type 2, complicated by diabetic retinopathy /microalbuminuria. - Diabetic status / severity: Controlled.  Lab Results  Component Value Date   HGBA1C 6.4 (A) 08/24/2023    - Hemoglobin A1c goal : <7%  - Medications: See below.  I) decrease Tresiba from 20 to 20 units daily. II) continue Jardiance 25 mg daily. III) renew metformin extended release 2000 mg daily. IV) take Prandin 1 mg as needed with large meal only.  Do not take in case of increased physical activity or work.  - Home glucose testing: CGM and check as needed. - Discussed/ Gave Hypoglycemia treatment plan.  # Consult : not required at this time.    # Annual urine for microalbuminuria/ creatinine ratio, no microalbuminuria currently, continue ACE/ARB /valsartan. Last  Lab Results  Component Value Date   MICRALBCREAT 90.6 (H) 02/16/2023    # Foot check nightly.  # Patient has diabetic retinopathy, follow-up with ophthalmology.  - Diet: Make healthy diabetic food choices - Life style / activity / exercise: Discussed.  2. Blood pressure  -  BP Readings from Last 1 Encounters:  08/24/23 136/70    - Control is in target.  - No change in current plans.  3. Lipid status / Hyperlipidemia - Last  Lab Results  Component Value Date   LDLCALC 58 07/13/2023   - Continue atorvastatin 80 mg daily.  On fenofibrate.  Diagnoses and all orders for this visit:  Controlled type 2 diabetes mellitus with moderate nonproliferative retinopathy of both eyes, without long-term current use of insulin, macular edema presence unspecified (HCC) -     POCT glycosylated hemoglobin (Hb A1C) -     empagliflozin (JARDIANCE) 25 MG TABS tablet; Take 1 tablet (25 mg total) by mouth daily.  Type 2 diabetes mellitus with hyperglycemia, with long-term current use of insulin (HCC) -     insulin  degludec (TRESIBA FLEXTOUCH) 100 UNIT/ML FlexTouch Pen; Inject 20 Units into the skin daily.  Other orders -     metFORMIN (GLUCOPHAGE-XR) 500 MG 24 hr tablet; Take 4 tablets (2,000 mg total) by mouth daily with breakfast. -     repaglinide (PRANDIN) 1 MG tablet; Take 1 tablet (1 mg total) by mouth 3 (three) times daily before meals. -     Continuous Glucose Sensor (FREESTYLE LIBRE 3 PLUS SENSOR) MISC; 1 each by Does not apply route continuous. Change every 15 days.    DISPOSITION Follow up in clinic in 4 months suggested.  Labs on same day of visit.   All questions answered and patient verbalized understanding of the plan.  [place] Shirla Hodgkiss, MD [facility] End[facility] [address] [place], [address] [place], [place] [address] Phone #  [phone]  At least part of this note was generated using voice recognition software. Inadvertent word errors may have occurred, which were not recognized during the proofreading process.

## 2023-08-27 ENCOUNTER — Other Ambulatory Visit: Payer: Self-pay | Admitting: Medical

## 2023-08-27 DIAGNOSIS — J4540 Moderate persistent asthma, uncomplicated: Secondary | ICD-10-CM

## 2023-08-31 LAB — HM DIABETES EYE EXAM

## 2023-09-05 ENCOUNTER — Other Ambulatory Visit: Payer: Self-pay | Admitting: Medical

## 2023-09-18 ENCOUNTER — Other Ambulatory Visit: Payer: Self-pay | Admitting: Medical

## 2023-09-23 ENCOUNTER — Other Ambulatory Visit: Payer: Self-pay | Admitting: Medical

## 2023-09-23 DIAGNOSIS — J4540 Moderate persistent asthma, uncomplicated: Secondary | ICD-10-CM

## 2023-09-24 NOTE — Telephone Encounter (Signed)
 Last apt 07/13/23.

## 2023-10-02 ENCOUNTER — Other Ambulatory Visit: Payer: Self-pay | Admitting: Medical

## 2023-11-13 ENCOUNTER — Other Ambulatory Visit: Payer: Self-pay | Admitting: Medical

## 2023-11-30 ENCOUNTER — Encounter: Payer: Self-pay | Admitting: Dietician

## 2023-11-30 ENCOUNTER — Encounter: Payer: Medicare Other | Attending: Endocrinology | Admitting: Dietician

## 2023-11-30 DIAGNOSIS — E1165 Type 2 diabetes mellitus with hyperglycemia: Secondary | ICD-10-CM | POA: Insufficient documentation

## 2023-11-30 DIAGNOSIS — Z794 Long term (current) use of insulin: Secondary | ICD-10-CM | POA: Insufficient documentation

## 2023-11-30 NOTE — Progress Notes (Signed)
 Diabetes Self-Management Education  Visit Type: Follow-up  Appt. Start Time: 0915 Appt. End Time: 0945 Patient is here today alone.  He was last seen by this RD 06/08/2023.  11/30/2023  [name], identified by name and date of birth, is a 73 y.o. male with a diagnosis of Diabetes:  .   ASSESSMENT History includes:  Type 2 diabetes (1995 and insulin since 2018), OSA (improved after weight loss - no C-pap), HLD, HTN, CKD, CHF, Cirrhosis, asthma, A-fib Medications include:  Jardiance, Metformin XR, Tresiba 20 units q am, trial of prandin with lunch and occasionally with dinner, spironolactone Labs noted to include:  A1C 6.4% 08/23/2024 decreased from 7.1% 05/18/2023. GFR 64  on 05/18/2023, Vitamin B-12 321 06/11/2019 and 682 on 12/22/2022. CGM:  FreeStyle Libre 3    CGM Results from download: 03/02/2023 06/08/2023 11/30/23  % Time CGM active:   96 %   (Goal >70%) 96% 95  Average glucose:   188 mg/dL for 14 days 914 X 14 days 138 x14  Glucose management indicator:   7.8 % 6.7% 6.6%  Time in range (70-180 mg/dL):   48 %   (Goal >78%) 77% 82  Time High (181-250 mg/dL):   37 %   (Goal < 29%) 20 16  Time Very High (>250 mg/dL):    15 %   (Goal < 5%) 1 1  Time Low (54-69 mg/dL):   0 %   (Goal <5%) 2 1  Time Very Low (<54 mg/dL):   0 %   (Goal <6%) 0 0    Weight hx: 70" 190 lbs 06/08/2023  (wanted to regain) and 11/30/2023 174 lbs 03/02/2023 182 lbs 02/16/2023 175 lbs 165 lbs (lost on GLP-1 drugs but stopped as he did not tolerate it) - did not feel well at this weight. "Lost muscle" 280 lbs highest adult weight Goal weight per patient about 180 lbs "want to gain muscle"   Patient lives with his wife.  She does most of the shopping and cooking.  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 Gets 10,000 steps per day on work days. Has weights but does not use them.  Mows his own lawn.  Has lived locally for 3 years to be near family. 2 vodka per day.which he has reduced   Diabetes  Self-Management Education - 11/30/23 0929       Visit Information   Visit Type Follow-up      Health Coping   How would you rate your overall health? Excellent      Psychosocial Assessment   Patient Belief/Attitude about Diabetes Motivated to manage diabetes    What is the hardest part about your diabetes right now, causing you the most concern, or is the most worrisome to you about your diabetes?   --   no problems   Self-care barriers None    Self-management support Doctor's office;CDE visits    Other persons present Patient    Patient Concerns Nutrition/Meal planning    Special Needs None    Preferred Learning Style No preference indicated    Learning Readiness Ready    How often do you need to have someone help you when you read instructions, pamphlets, or other written materials from your doctor or pharmacy? 1 - Never      Pre-Education Assessment   Patient understands the diabetes disease and treatment process. Demonstrates understanding / competency    Patient understands incorporating nutritional management into lifestyle. Needs Review  Patient undertands incorporating physical activity into lifestyle. Demonstrates understanding / competency    Patient understands using medications safely. Demonstrates understanding / competency    Patient understands monitoring blood glucose, interpreting and using results Comprehends key points    Patient understands prevention, detection, and treatment of acute complications. Comprehends key points    Patient understands prevention, detection, and treatment of chronic complications. Compreheands key points    Patient understands how to develop strategies to address psychosocial issues. Comprehends key points    Patient understands how to develop strategies to promote health/change behavior. Comprehends key points      Complications   Last HgB A1C per patient/outside source 6.4 %   08/23/2024 decreased from 7.1% 05/18/2023   How often do  you check your blood sugar? > 4 times/day    Number of hypoglycemic episodes per month 6    Can you tell when your blood sugar is low? Yes    What do you do if your blood sugar is low? honey or glucose tab    Number of hyperglycemic episodes ( >200mg /dL): Occasional    Have you had a dilated eye exam in the past 12 months? Yes    Have you had a dental exam in the past 12 months? No    Are you checking your feet? Yes    How many days per week are you checking your feet? 5      Dietary Intake   Breakfast whole grain cereal with nuts and honey and milk    Snack (morning) fruit    Lunch 2 [employer] sandwich    Snack (afternoon) fruit    Dinner Chicken noodle soup, sugar free treat    Beverage(s) water, flavored water, coffee, 2 cups whole milk, 2 vodka shots      Activity / Exercise   Activity / Exercise Type Light (walking / raking leaves)   10000 steps per day on work days     Patient Education   Previous Diabetes Education Yes (please comment)   05/2023   Healthy Eating Meal options for control of blood glucose level and chronic complications.    Being Active Role of exercise on diabetes management, blood pressure control and cardiac health.    Medications Reviewed patients medication for diabetes, action, purpose, timing of dose and side effects.    Monitoring Taught/evaluated CGM (comment)    Acute complications Taught prevention, symptoms, and  treatment of hypoglycemia - the 15 rule.    Chronic complications Relationship between chronic complications and blood glucose control    Diabetes Stress and Support Identified and addressed patients feelings and concerns about diabetes;Worked with patient to identify barriers to care and solutions      Individualized Goals (developed by patient)   Nutrition General guidelines for healthy choices and portions discussed    Physical Activity Exercise 5-7 days per week    Medications take my medication as prescribed    Monitoring  Consistenly  use CGM    Reducing Risk examine blood glucose patterns;do foot checks daily;treat hypoglycemia with 15 grams of carbs if blood glucose less than 70mg /dL      Patient Self-Evaluation of Goals - Patient rates self as meeting previously set goals (% of time)   Nutrition >75% (most of the time)    Physical Activity >75% (most of the time)    Medications >75% (most of the time)    Monitoring >75% (most of the time)    Problem Solving and behavior change strategies  >  75% (most of the time)    Reducing Risk (treating acute and chronic complications) >75% (most of the time)    Health Coping >75% (most of the time)      Post-Education Assessment   Patient understands the diabetes disease and treatment process. Demonstrates understanding / competency    Patient understands incorporating nutritional management into lifestyle. Comprehends key points    Patient undertands incorporating physical activity into lifestyle. Demonstrates understanding / competency    Patient understands using medications safely. Demonstrates understanding / competency    Patient understands monitoring blood glucose, interpreting and using results Demonstrates understanding / competency    Patient understands prevention, detection, and treatment of acute complications. Demonstrates understanding / competency    Patient understands prevention, detection, and treatment of chronic complications. Demonstrates understanding / competency    Patient understands how to develop strategies to address psychosocial issues. Demonstrates understanding / competency    Patient understands how to develop strategies to promote health/change behavior. Comprehends key points      Outcomes   Expected Outcomes Demonstrated interest in learning. Expect positive outcomes    Future DMSE 6 months    Program Status Not Completed      Subsequent Visit   Since your last visit have you continued or begun to take your medications as prescribed? Yes     Since your last visit have you experienced any weight changes? No change             Individualized Plan for Diabetes Self-Management Training:   Learning Objective:  Patient will have a greater understanding of diabetes self-management. Patient education plan is to attend individual and/or group sessions per assessed needs and concerns.   Plan:   Patient Instructions  Randie [employer] job on changes made! Continue to watch your CGM for effects of what you eat on your blood glucose. Continue to stay active! Increase your vegetable intake (1/2 plate) Lean protein, less red meat. Continue Mindfulness with alcohol  Expected Outcomes:  Demonstrated interest in learning. Expect positive outcomes  Education material provided:   If problems or questions, patient to contact team via:  Phone  Future DSME appointment: 6 months

## 2023-11-30 NOTE — Patient Instructions (Addendum)
 Great job on changes made! Continue to watch your CGM for effects of what you eat on your blood glucose. Continue to stay active! Increase your vegetable intake (1/2 plate) Lean protein, less red meat. Continue Mindfulness with alcohol

## 2023-12-03 ENCOUNTER — Other Ambulatory Visit: Payer: Self-pay | Admitting: Medical

## 2023-12-11 ENCOUNTER — Other Ambulatory Visit: Payer: Self-pay | Admitting: Medical

## 2023-12-13 ENCOUNTER — Telehealth: Payer: Self-pay | Admitting: Internal Medicine

## 2023-12-13 NOTE — Telephone Encounter (Signed)
 Patient will have to go to pharmacy. He has medicare  Copied from [employer] [id number]. Topic: General - Other >> Dec 13, 2023  9:13 AM Truddie Crumble wrote: Reason for CRM: patient called wanting to know if he can get a shingle shot on his 328 appointment

## 2023-12-14 ENCOUNTER — Ambulatory Visit: Payer: Medicare Other | Admitting: Medical

## 2023-12-14 ENCOUNTER — Encounter: Payer: Self-pay | Admitting: Medical

## 2023-12-14 VITALS — BP 130/80 | HR 85 | Ht 69.0 in | Wt 187.0 lb

## 2023-12-14 DIAGNOSIS — I700 Atherosclerosis of aorta: Secondary | ICD-10-CM

## 2023-12-14 DIAGNOSIS — Z0000 Encounter for general adult medical examination without abnormal findings: Secondary | ICD-10-CM

## 2023-12-14 DIAGNOSIS — E1165 Type 2 diabetes mellitus with hyperglycemia: Secondary | ICD-10-CM | POA: Diagnosis not present

## 2023-12-14 DIAGNOSIS — M069 Rheumatoid arthritis, unspecified: Secondary | ICD-10-CM

## 2023-12-14 DIAGNOSIS — Z794 Long term (current) use of insulin: Secondary | ICD-10-CM

## 2023-12-14 DIAGNOSIS — J4540 Moderate persistent asthma, uncomplicated: Secondary | ICD-10-CM

## 2023-12-14 DIAGNOSIS — Z7901 Long term (current) use of anticoagulants: Secondary | ICD-10-CM

## 2023-12-14 DIAGNOSIS — I2510 Atherosclerotic heart disease of native coronary artery without angina pectoris: Secondary | ICD-10-CM

## 2023-12-14 DIAGNOSIS — I509 Heart failure, unspecified: Secondary | ICD-10-CM

## 2023-12-14 DIAGNOSIS — F1010 Alcohol abuse, uncomplicated: Secondary | ICD-10-CM | POA: Diagnosis not present

## 2023-12-14 DIAGNOSIS — M51369 Other intervertebral disc degeneration, lumbar region without mention of lumbar back pain or lower extremity pain: Secondary | ICD-10-CM

## 2023-12-14 DIAGNOSIS — K7030 Alcoholic cirrhosis of liver without ascites: Secondary | ICD-10-CM | POA: Diagnosis not present

## 2023-12-14 DIAGNOSIS — N1831 Chronic kidney disease, stage 3a: Secondary | ICD-10-CM

## 2023-12-14 DIAGNOSIS — I480 Paroxysmal atrial fibrillation: Secondary | ICD-10-CM

## 2023-12-14 DIAGNOSIS — I10 Essential (primary) hypertension: Secondary | ICD-10-CM

## 2023-12-14 DIAGNOSIS — E559 Vitamin D deficiency, unspecified: Secondary | ICD-10-CM

## 2023-12-14 DIAGNOSIS — E782 Mixed hyperlipidemia: Secondary | ICD-10-CM

## 2023-12-14 MED ORDER — BUPROPION HCL ER (XL) 150 MG PO TB24: 150.0000 mg | ORAL_TABLET | Freq: Every day | ORAL | 1 refills | Status: DC

## 2023-12-14 NOTE — Progress Notes (Signed)
 Subjective:    Theodore Bush is a 73 y.o. male who presents for well visit/med check  Chief Complaint  Patient presents with   med check    Patient here for med check, had AWV 9/24 and CPE 10/24. No new concerns. Does not need refills, will let us know when he needs.     Primary Care Provider Natoria Archibald, Kermit Balo, PA-C here for primary care  Current Health Care Team: Eye doctor, Dr. [place] eye Dr. Alben Deeds, rheumatology Dr. Reather Littler, endocrinology Dr. Peter [place], cardiology Dr. Brynda Greathouse, cardiothoracic surgery Dr. Doristine Locks, GI Dr. Virl Diamond, pulmonology   Concerns: He notes some allergies flaring up lately and has general arthritis pains from time to time.  He denies any new problems  He does endorse still drinking somewhat regularly.   Exercise Current exercise habits: Still works regularly, gets about 10,000 steps minimum daily, works as a Veterinary surgeon.  Nutrition/Diet Current diet: in general, a "healthy" diet      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   Asthma    Atrial fibrillation (HCC) 09/02/2018   CHF (congestive heart failure) (HCC)    Chronic kidney disease 06/11/2019   Cirrhosis (HCC)    Controlled diabetes mellitus type 2 with complications (HCC) 09/02/2018   Controlled type 2 diabetes mellitus with complication, with long-term current use of insulin (HCC) 09/02/2018   Coronary artery calcification 06/11/2019   Hyperlipidemia    Hypertension    Insomnia 06/11/2019   Pneumonia 2019   Sleep apnea    he was 280lb at the time   Staph infection    to elbow 15-25yrs ago   Type 2 diabetes mellitus with hyperglycemia, without long-term current use of insulin (HCC) 02/03/2022    Past Surgical History:  Procedure Laterality Date   COLONOSCOPY  12/2021   PALATE / UVULA BIOPSY / EXCISION     trimmed several times    Social History   Socioeconomic History   Marital status:  Married    Spouse name: Not on file   Number of children: 2   Years of education: Not on file   Highest education level: Not on file  Occupational History   Occupation: Retired    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   Sexual activity: Not on file  Other Topics Concern   Not on file  Social History Narrative   Lives with wife Tammy.   Son and a daughter in [place].  Has several grandchildren.   Pipe fitter, still works.   11/2023   Social Drivers of Health   Financial Resource Strain: Low Risk  (06/15/2023)   Overall Financial Resource Strain (CARDIA)    Difficulty of Paying Living Expenses: Not hard at all  Food Insecurity: No Food Insecurity (06/15/2023)   Hunger Vital Sign    Worried About Running Out of Food in the Last Year: Never true    Ran Out of Food in the Last Year: Never true  Transportation Needs:  No Transportation Needs (06/15/2023)   PRAPARE - Administrator, Civil Service (Medical): No    Lack of Transportation (Non-Medical): No  Physical Activity: Unknown (06/15/2023)   Exercise Vital Sign    Days of Exercise per Week: 4 days    Minutes of Exercise per Session: Patient declined  Stress: No Stress Concern Present (06/15/2023)   [employer] of Occupational Health - Occupational Stress Questionnaire    Feeling of Stress : Only a little  Social Connections: Unknown (06/15/2023)   Social Connection and Isolation Panel [NHANES]    Frequency of Communication with Friends and Family: Three times a week    Frequency of Social Gatherings with Friends and Family: Once a week    Attends  Religious Services: Not on file    Active Member of Clubs or Organizations: Patient declined    Attends Banker Meetings: Patient declined    Marital Status: Married  Catering manager Violence: Not At Risk (06/15/2023)   Humiliation, Afraid, Rape, and Kick questionnaire    Fear of Current or Ex-Partner: No    Emotionally Abused: No    Physically Abused: No    Sexually Abused: No    Family History  Problem Relation Age of Onset   Lupus Mother    Hypertension Mother    Diabetes Father    Diabetes Paternal Grandmother    Colon cancer Neg Hx    Stomach cancer Neg Hx    Esophageal cancer Neg Hx    Rectal cancer Neg Hx      Current Outpatient Medications:    atorvastatin (LIPITOR) 80 MG tablet, TAKE 1 TABLET(80 MG) BY MOUTH DAILY, Disp: 90 tablet, Rfl: 3   buPROPion (WELLBUTRIN XL) 150 MG 24 hr tablet, Take 1 tablet (150 mg total) by mouth daily., Disp: 30 tablet, Rfl: 1   carvedilol (COREG) 25 MG tablet, TAKE 1 TABLET(25 MG) BY MOUTH TWICE DAILY, Disp: 180 tablet, Rfl: 3   cholecalciferol (VITAMIN D3) 25 MCG (1000 UNIT) tablet, Take 1,000 Units by mouth daily., Disp: , Rfl:    Continuous Glucose Sensor (FREESTYLE LIBRE 3 PLUS SENSOR) MISC, 1 each by Does not apply route continuous. Change every 15 days., Disp: 6 each, Rfl: 3   Continuous Glucose Sensor (FREESTYLE LIBRE 3 SENSOR) MISC, by Does not apply route., Disp: , Rfl:    empagliflozin (JARDIANCE) 25 MG TABS tablet, Take 1 tablet (25 mg total) by mouth daily., Disp: 90 tablet, Rfl: 3   ENTRESTO 24-26 MG, TAKE 1 TABLET BY MOUTH TWICE DAILY, Disp: 180 tablet, Rfl: 3   Fenofibrate 50 MG CAPS, TAKE ONE CAPSULE BY MOUTH DAILY, Disp: 90 capsule, Rfl: 0   HYDROcodone-acetaminophen (NORCO) 10-325 MG tablet, Take 1 tablet by mouth every 6 (six) hours as needed., Disp: , Rfl:    insulin degludec (TRESIBA FLEXTOUCH) 100 UNIT/ML FlexTouch Pen, Inject 20 Units into the skin daily., Disp: 15 mL, Rfl: 3   Insulin Pen Needle (PEN  NEEDLES) 32G X 4 MM MISC, 1 each by Other route daily., Disp: 100 each, Rfl: 2   metFORMIN (GLUCOPHAGE-XR) 500 MG 24 hr tablet, Take 4 tablets (2,000 mg total) by mouth daily with breakfast., Disp: 360 tablet, Rfl: 3   montelukast (SINGULAIR) 10 MG tablet, TAKE 1 TABLET(10 MG) BY MOUTH AT BEDTIME, Disp: 90 tablet, Rfl: 0   Multiple Vitamin (MULTIVITAMIN WITH MINERALS) TABS tablet, Take 1 tablet by mouth daily., Disp: , Rfl:    repaglinide (PRANDIN) 1 MG tablet, Take 1 tablet (  1 mg total) by mouth 3 (three) times daily before meals., Disp: 270 tablet, Rfl: 3   spironolactone (ALDACTONE) 25 MG tablet, TAKE 1 TABLET(25 MG) BY MOUTH EVERY OTHER DAY, Disp: 45 tablet, Rfl: 0   SYMBICORT 160-4.5 MCG/ACT inhaler, INHALE 2 PUFFS INTO THE LUNGS TWICE DAILY, Disp: 10.2 g, Rfl: 1   traZODone (DESYREL) 50 MG tablet, TAKE ONE-HALF TO ONE TABLET BY MOUTH AT BEDTIME AS NEEDED FOR SLEEP., Disp: 90 tablet, Rfl: 0   XARELTO 20 MG TABS tablet, TAKE ONE BY MOUTH DAILY WITH DINNER, Disp: 90 tablet, Rfl: 1   ACCU-CHEK GUIDE test strip, USE TO TEST 1 TO 2 TIMES PER DAY (Patient not taking: Reported on 12/14/2023), Disp: 100 strip, Rfl: 3   Accu-Chek Softclix Lancets lancets, USE TO TEST 1 TO 2 TIMES PER DAY (Patient not taking: Reported on 12/14/2023), Disp: 100 each, Rfl: 3   Adalimumab 40 MG/0.4ML PNKT, Inject 1 each into the skin. 2 times a months. (Patient not taking: Reported on 12/14/2023), Disp: , Rfl:    albuterol (VENTOLIN HFA) 108 (90 Base) MCG/ACT inhaler, INHALE 1 PUFF INTO THE LUNGS EVERY 6 HOURS AS NEEDED FOR WHEEZING OR SHORTNESS OF BREATH (Patient not taking: Reported on 12/14/2023), Disp: 18 g, Rfl: 1   Blood Glucose Monitoring Suppl (ACCU-CHEK GUIDE ME) w/Device KIT, Test 1-2 times daily. Pt needs accu-chek meter. Dx. Ell.9 (Patient not taking: Reported on 12/14/2023), Disp: 1 kit, Rfl: 0  Allergies  Allergen Reactions   Quinolones Other (See Comments)    History reviewed: allergies, current medications,  past family history, past medical history, past social history, past surgical history and problem list    Objective:    BP 130/80   Pulse 85   Ht 5\' 9"  (1.753 m)   Wt 187 lb (84.8 kg)   SpO2 97%   BMI 27.62 kg/m   Wt Readings from Last 3 Encounters:  12/14/23 187 lb (84.8 kg)  08/24/23 186 lb (84.4 kg)  07/13/23 187 lb 9.6 oz (85.1 kg)   Gen: wd, wn, nad Skin: no worrisome lesions HEENT: normocephalic, sclerae anicteric,  dentures present Neck: supple, no lymphadenopathy, no thyromegaly, no masses, no bruits Heart: RRR, normal S1, S2, mild 2/6 murmur in upper sternal borders Lungs: CTA bilaterally, no wheezes, rhonchi, or rales Abdomen: +bs, soft, non tender, non distended, no masses, no hepatomegaly, no splenomegaly Musculoskeletal: nontender, no swelling, no obvious deformity Extremities: no edema, no cyanosis, no clubbing Pulses: 2+ symmetric, upper and 1+ lower extremities, normal cap refill Neurological: alert, oriented x 3, CN2-12 intact, strength normal upper extremities and lower extremities, sensation normal throughout, DTRs 2+ throughout, no cerebellar signs, gait normal Psychiatric: normal affect, behavior normal, pleasant  GU/rectal - deferred    Diabetic Foot Exam - Simple   Simple Foot Form Diabetic Foot exam was performed with the following findings: Yes 12/14/2023  1:23 PM  Visual Inspection See comments: Yes Sensation Testing Intact to touch and monofilament testing bilaterally: Yes Pulse Check Posterior Tibialis and Dorsalis pulse intact bilaterally: Yes Comments Mild hypertrophic toenails the great toes      Assessment:   Encounter Diagnoses  Name Primary?   Encounter for health maintenance examination in adult Yes   Type 2 diabetes mellitus with hyperglycemia, with long-term current use of insulin (HCC)    Alcohol abuse    Alcoholic cirrhosis of liver without ascites (HCC)    Aortic arch atherosclerosis (HCC)    Paroxysmal atrial  fibrillation (HCC)    Chronic anticoagulation  Chronic congestive heart failure, unspecified heart failure type (HCC)    Chronic kidney disease, stage 3a (HCC)    Coronary artery calcification    Vitamin D deficiency    Rheumatoid arthritis, involving unspecified site, unspecified whether rheumatoid factor present (HCC)    Moderate persistent asthma, unspecified whether complicated    Mixed dyslipidemia    Primary hypertension    Degeneration of intervertebral disc of lumbar region, unspecified whether pain present        Plan:    This visit was a preventative care visit, also known as wellness visit or routine physical.   Topics typically include healthy lifestyle, diet, exercise, preventative care, vaccinations, sick and well care, proper use of emergency dept and after hours care, as well as other concerns.     Recommendations: Continue to return yearly for your annual wellness and preventative care visits.  This gives [place] a chance to discuss healthy lifestyle, exercise, vaccinations, review your chart record, and perform screenings where appropriate.  I recommend you see your eye doctor yearly for routine vision care.  I recommend you see your dentist yearly for routine dental care including hygiene visits twice yearly.   Vaccination recommendations were reviewed Immunization History  Administered Date(s) Administered   Fluad Quad(high Dose 65+) 06/11/2019, 08/03/2020, 06/10/2021, 06/09/2022   Influenza-Unspecified 07/27/2023   Moderna SARS-COV2 Booster Vaccination 08/03/2020   Moderna Sars-Covid-2 Vaccination 12/15/2019, 01/15/2020, 08/03/2020   PNEUMOCOCCAL CONJUGATE-20 06/09/2022   Pfizer(Comirnaty)Fall Seasonal Vaccine 12 years and older 10/31/2019, 11/28/2019, 02/18/2020, 07/27/2023   Respiratory Syncytial Virus Vaccine,Recomb Aduvanted(Arexvy) 07/27/2023   We will request vaccine records from your pharmacy as you report possibly being up-to-date on tetanus and  Shingrix  Screening for cancer: Colon cancer screening: I reviewed your colonoscopy on file that is up to date from 12/2021.  You apparently have a another repeat colonoscopy coming up soon this year 2025  Skin cancer screening: Check your skin regularly for new changes, growing lesions, or other lesions of concern Come in for evaluation if you have skin lesions of concern.  Lung cancer screening: If you have a greater than 20 pack year history of tobacco use, then you may qualify for lung cancer screening with a chest CT scan.   Please call your insurance company to inquire about coverage for this test.  We currently don't have screenings for other cancers besides breast, cervical, colon, and lung cancers.  If you have a strong family history of cancer or have other cancer screening concerns, please let me know.    Bone health: Get at least 150 minutes of aerobic exercise weekly Get weight bearing exercise at least once weekly Bone density test:  A bone density test is an imaging test that uses a type of X-ray to measure the amount of calcium and other minerals in your bones. The test may be used to diagnose or screen you for a condition that causes weak or thin bones (osteoporosis), predict your risk for a broken bone (fracture), or determine how well your osteoporosis treatment is working. The bone density test is recommended for females 65 and older, or females or males <65 if certain risk factors such as thyroid disease, long term use of steroids such as for asthma or rheumatological issues, vitamin D deficiency, estrogen deficiency, family history of osteoporosis, self or family h disease seem to be with history of fragility fracture in first degree relative.   Consider a baseline bone density test.  Please call to schedule your bone density test.  [facility]  [phone] [address] [place], [place] [address]   Medical care  options: I recommend you continue to seek care here first for routine care.  We try really hard to have available appointments Monday through Friday daytime hours for sick visits, acute visits, and physicals.  Urgent care should be used for after hours and weekends for significant issues that cannot wait till the next day.  The emergency department should be used for significant potentially life-threatening emergencies.  The emergency department is expensive, can often have long wait times for less significant concerns, so try to utilize primary care, urgent care, or telemedicine when possible to avoid unnecessary trips to the emergency department.  Virtual visits and telemedicine have been introduced since the pandemic started in 2020, and can be convenient ways to receive medical care.  We offer virtual appointments as well to assist you in a variety of options to seek medical care.   Advanced Directives: On file    Separate significant issues discussed: Cardiac: I reviewed his recent cardiology notes from 06/2023 CHF, chronic systolic, hypertension - EF 35 to 40%, continued on Aldactone, Coreg, Entresto and Jardiance.  They felt his left ventricle dysfunction related to alcohol abuse and advise he quit alcohol.  A-fib-persistent, permanent, continued on Coreg and Xarelto  CAD, heavy coronary calcification on CT, continued on statin Atorvastain 80mg  daily, Fenofibrate 50mg  daily  History of thoracic aortic aneurysm 4.9 cm, advise yearly CT, and recommended avoidance of fluoroquinolone antibiotics   Gastro: I reviewed his GI notes from June 2024 Given findings of nodular appearing liver contour and calcified granulomas in the liver and mild splenomegaly megaly, they felt like cirrhosis was a possibility along with portal hypertensive changes.  They are pursuing an ultrasound elastography, EGD to evaluate varices and discussed the possibility of liver biopsy.  They advised cessation of all  alcohol   Diabetes/Endo: I reviewed May 2024 endocrinology notes Continue Tresiba 20 units, Jardiance 25 mg daily, metformin ER 2 g daily,Prandin 1mg  with large meals   Thoracic Aneurysm: I reviewed his cardiothoracic May 2024 notes regarding thoracic aortic aneurysm 4.9 cm, advised semiannual imaging by CTA or MRA    Rheumatoid arthritis-sees rheumatology regularly, compliant with medications.    Chronic pain - sees [facility]   Primary insomnia-does fine on trazodone   Dyslipidemia-compliant with statin, compliant with fenofibrate.     CKD 3-updated labs today, avoid NSAIDs, hydrate well daily   History of cirrhosis of the liver-I recommend cessation of alcohol.  Sees gastroenterology.  Follow up with GI   Discussed alcohol cessation.  Begin Wellubtrin and consider [employer]  He was agreeable to medication, and not agreeable to counseling.   Asthma  -  Continue Symbicort BID, albuterol prn    Theodore "Kathlene November" was seen today for med check.  Diagnoses and all orders for this visit:  Encounter for health maintenance examination in adult -     Microalbumin/Creatinine Ratio, Urine -     Comprehensive metabolic panel with GFR -     CBC with Differential/Platelet  Type 2 diabetes mellitus with hyperglycemia, with long-term current use of insulin (HCC) -     Microalbumin/Creatinine Ratio, Urine  Alcohol abuse  Alcoholic cirrhosis of liver without ascites (HCC)  Aortic arch atherosclerosis (HCC)  Paroxysmal atrial fibrillation (HCC)  Chronic anticoagulation  Chronic congestive heart failure, unspecified heart failure type (HCC)  Chronic kidney disease, stage 3a (HCC)  Coronary artery calcification  Vitamin D deficiency  Rheumatoid arthritis, involving unspecified site, unspecified whether rheumatoid factor present (HCC)  Moderate persistent asthma, unspecified whether complicated  Mixed dyslipidemia  Primary hypertension  Degeneration  of intervertebral disc of lumbar region, unspecified whether pain present  Other orders -     buPROPion (WELLBUTRIN XL) 150 MG 24 hr tablet; Take 1 tablet (150 mg total) by mouth daily.      Follow-up pending labs, yearly for physical

## 2023-12-15 LAB — CBC WITH DIFFERENTIAL/PLATELET
Basophils Absolute: 0 10*3/uL (ref 0.0–0.2)
Basos: 1 %
EOS (ABSOLUTE): 0.3 10*3/uL (ref 0.0–0.4)
Eos: 5 %
Hematocrit: 45 % (ref 37.5–51.0)
Hemoglobin: 15.4 g/dL (ref 13.0–17.7)
Immature Grans (Abs): 0 10*3/uL (ref 0.0–0.1)
Immature Granulocytes: 0 %
Lymphocytes Absolute: 1.7 10*3/uL (ref 0.7–3.1)
Lymphs: 32 %
MCH: 32.2 pg (ref 26.6–33.0)
MCHC: 34.2 g/dL (ref 31.5–35.7)
MCV: 94 fL (ref 79–97)
Monocytes Absolute: 0.4 10*3/uL (ref 0.1–0.9)
Monocytes: 8 %
Neutrophils Absolute: 2.8 10*3/uL (ref 1.4–7.0)
Neutrophils: 54 %
Platelets: 163 10*3/uL (ref 150–450)
RBC: 4.78 x10E6/uL (ref 4.14–5.80)
RDW: 13.1 % (ref 11.6–15.4)
WBC: 5.2 10*3/uL (ref 3.4–10.8)

## 2023-12-15 LAB — COMPREHENSIVE METABOLIC PANEL WITH GFR
ALT: 25 IU/L (ref 0–44)
AST: 39 IU/L (ref 0–40)
Albumin: 4.5 g/dL (ref 3.8–4.8)
Alkaline Phosphatase: 36 IU/L — ABNORMAL LOW (ref 44–121)
BUN/Creatinine Ratio: 22 (ref 10–24)
BUN: 22 mg/dL (ref 8–27)
Bilirubin Total: 0.6 mg/dL (ref 0.0–1.2)
CO2: 26 mmol/L (ref 20–29)
Calcium: 9.4 mg/dL (ref 8.6–10.2)
Chloride: 99 mmol/L (ref 96–106)
Creatinine, Ser: 1.02 mg/dL (ref 0.76–1.27)
Globulin, Total: 2.6 g/dL (ref 1.5–4.5)
Glucose: 120 mg/dL — ABNORMAL HIGH (ref 70–99)
Potassium: 4.4 mmol/L (ref 3.5–5.2)
Sodium: 139 mmol/L (ref 134–144)
Total Protein: 7.1 g/dL (ref 6.0–8.5)
eGFR: 79 mL/min/{1.73_m2} (ref >59)

## 2023-12-15 LAB — MICROALBUMIN / CREATININE URINE RATIO
Creatinine, Urine: 67.3 mg/dL
Microalb/Creat Ratio: 709 mg/g{creat} — ABNORMAL HIGH (ref 0–29)
Microalbumin, Urine: 477.2 ug/mL

## 2023-12-16 NOTE — Progress Notes (Signed)
 Send copy of labs to his diabetes doctor   Labs show some underlying change or worsening of kidney function called microalbumin  Glucose 120.  Rest of labs ok.  I recommend you talk to your diabetes doctor to lower metformin dose and possibly increase insulin given this change in kidney  I recommend cutting down gradually on alcohol and stopping alcohol completely over the next few weeks.   Begin the trial of Wellbutrin  Follow up in 1 month

## 2023-12-17 ENCOUNTER — Other Ambulatory Visit: Payer: Self-pay | Admitting: Medical

## 2023-12-17 MED ORDER — TRAZODONE HCL 100 MG PO TABS: 100.0000 mg | ORAL_TABLET | Freq: Every day | ORAL | 1 refills | Status: DC

## 2023-12-28 ENCOUNTER — Ambulatory Visit (INDEPENDENT_AMBULATORY_CARE_PROVIDER_SITE_OTHER): Payer: Medicare Other | Admitting: Endocrinology

## 2023-12-28 ENCOUNTER — Encounter: Payer: Self-pay | Admitting: Endocrinology

## 2023-12-28 ENCOUNTER — Other Ambulatory Visit: Payer: Self-pay | Admitting: Medical

## 2023-12-28 VITALS — BP 134/74 | HR 78 | Resp 20 | Ht 69.0 in | Wt 184.2 lb

## 2023-12-28 DIAGNOSIS — E1165 Type 2 diabetes mellitus with hyperglycemia: Secondary | ICD-10-CM

## 2023-12-28 DIAGNOSIS — Z794 Long term (current) use of insulin: Secondary | ICD-10-CM | POA: Diagnosis not present

## 2023-12-28 LAB — POCT GLYCOSYLATED HEMOGLOBIN (HGB A1C): Hemoglobin A1C: 6.6 % — AB (ref 4.0–5.6)

## 2023-12-28 NOTE — Progress Notes (Signed)
 Outpatient Endocrinology Note Theodore Josemiguel Gries, MD  12/28/23  Patient's Name: Theodore Bush    DOB: May 22, 1951    MRN: [id number]                                                    REASON OF VISIT: Follow up for type 2 diabetes mellitus  PCP: Jac Canavan, PA-C  HISTORY OF PRESENT ILLNESS:   Theodore Bush is a 73 y.o. old male with past medical history listed below, is here for follow up for type 2 diabetes mellitus.   Pertinent Diabetes History: Patient was previously seen by Dr. Lucianne Muss and was last time seen in May 2024.  Patient was diagnosed with type 2 diabetes mellitus in 1995.  He has been on insulin therapy since 2018.  Chronic Diabetes Complications : Retinopathy: yes, moderate non proliferative diabetic retinopathy, following with ophthalmology regularly. Every 6 months, Dr. Dione Bush.  Nephropathy: Microalbuminuria, on Jardiance Theodore Bush. Peripheral neuropathy: no Coronary artery disease: yes Stroke: no  Relevant comorbidities and cardiovascular risk factors: Obesity: no Body mass index is 27.2 kg/m.  Hypertension: Yes  Hyperlipidemia : Yes, on statin   Current / Home Diabetic regimen includes:  Non-insulin hypoglycemic drugs:  metformin XR 2000 mg daily, Jardiance 25 mg daily.  Prandin /repaglinide 1 mg with meals three times a day.  Prandin was started in May 2024.     Insulin regimen: Tresiba 20 units once a day  in the morning.      Prior diabetic medications: Theodore Bush was started in 2018.  He had been on Januvia, linagliptin in the past.  He had nausea and vomiting with 5 mg of Mounjaro and Ozempic 0.25 mg weekly.  He reported significant weight loss with Ozempic.  Glycemic data:    CONTINUOUS GLUCOSE MONITORING SYSTEM (CGMS) INTERPRETATION: At today's visit, we reviewed CGM downloads. The full report is scanned in the media. Reviewing the CGM trends, blood glucose are as follows:  FreeStyle Libre 3+ CGM-  Sensor Download (Sensor download was reviewed and  summarized below.) Dates: March 29 to December 28, 2023, 14 days Sensor Average: 131  Glucose Management Indicator: 6.4%  % data captured: 96%    Interpretation: Mostly acceptable blood sugar with occasional random hypoglycemia with blood sugars in 60s in the late afternoon and sometime around bedtime, related to timing of in between the meals and sometime use of repaglinide.  No significant hyperglycemia.  Hypoglycemia: Patient has minor hypoglycemic episodes. Patient has hypoglycemia awareness.  Factors modifying glucose control: 1.  Diabetic diet assessment: 3 meals a day.  2.  Staying active or exercising: Busy at work, is still working in [employer] representative.  3.  Medication compliance: compliant all of the time.  Interval history  CGM data as reviewed above.  Random hypoglycemia.  Diabetes regimen reviewed and as noted above.  He has still been working in [employer] representative.  No other complaints today.  REVIEW OF SYSTEMS As per history of present illness.   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   Asthma    Atrial fibrillation (HCC) 09/02/2018   CHF (congestive heart failure) (HCC)    Chronic kidney disease 06/11/2019   Cirrhosis (HCC)    Controlled diabetes mellitus type 2 with complications (HCC) 09/02/2018   Controlled type 2 diabetes mellitus with  complication, with long-term current use of insulin (HCC) 09/02/2018   Coronary artery calcification 06/11/2019   Hyperlipidemia    Hypertension    Insomnia 06/11/2019   Pneumonia 2019   Sleep apnea    he was 280lb at the time   Staph infection    to elbow 15-53yrs ago   Type 2 diabetes mellitus with hyperglycemia, without long-term current use of insulin (HCC) 02/03/2022    PAST SURGICAL HISTORY: Past Surgical History:  Procedure Laterality Date   COLONOSCOPY  12/2021   PALATE / UVULA BIOPSY / EXCISION     trimmed several times    ALLERGIES: Allergies   Allergen Reactions   Quinolones Other (See Comments)    FAMILY HISTORY:  Family History  Problem Relation Age of Onset   Lupus Mother    Hypertension Mother    Diabetes Father    Diabetes Paternal Grandmother    Colon cancer Neg Hx    Stomach cancer Neg Hx    Esophageal cancer Neg Hx    Rectal cancer Neg Hx     SOCIAL HISTORY: Social History   Socioeconomic History   Marital status: Married    Spouse name: Not on file   Number of children: 2   Years of education: Not on file   Highest education level: Not on file  Occupational History   Occupation: Retired    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   Sexual activity: Not on file  Other Topics Concern   Not on file  Social History Narrative   Lives with wife Theodore Bush.   Son and a daughter in [place].  Has several grandchildren.   Pipe fitter, still works.   11/2023   Social Drivers of Health   Financial Resource Strain: Low Risk  (06/15/2023)   Overall Financial Resource Strain (CARDIA)    Difficulty of Paying Living Expenses: Not hard at all  Food Insecurity: No Food Insecurity (06/15/2023)   Hunger Vital Sign    Worried About Running Out of Food in the Last Year: Never true    Ran Out of Food in the Last Year: Never true  Transportation Needs: No Transportation Needs (06/15/2023)   PRAPARE - Administrator, Civil Service (Medical): No    Lack of Transportation (Non-Medical): No  Physical Activity: Unknown (06/15/2023)   Exercise Vital Sign    Days of Exercise per Week: 4 days    Minutes of Exercise per Session: Patient declined  Stress: No  Stress Concern Present (06/15/2023)   [employer] of Occupational Health - Occupational Stress Questionnaire    Feeling of Stress : Only a little  Social Connections: Unknown (06/15/2023)   Social Connection and Isolation Panel [NHANES]    Frequency of Communication with Friends and Family: Three times a week    Frequency of Social Gatherings with Friends and Family: Once a week    Attends Religious Services: Not on file    Active Member of Clubs or Organizations: Patient declined    Attends Club or  Organization Meetings: Patient declined    Marital Status: Married    MEDICATIONS:  Current Outpatient Medications  Medication Sig Dispense Refill   Adalimumab 40 MG/0.4ML PNKT Inject 1 each into the skin. 2 times a months.     albuterol (VENTOLIN HFA) 108 (90 Base) MCG/ACT inhaler INHALE 1 PUFF INTO THE LUNGS EVERY 6 HOURS AS NEEDED FOR WHEEZING OR SHORTNESS OF BREATH 18 g 1   atorvastatin (LIPITOR) 80 MG tablet TAKE 1 TABLET(80 MG) BY MOUTH DAILY 90 tablet 3   Blood Glucose Monitoring Suppl (ACCU-CHEK GUIDE ME) w/Device KIT Test 1-2 times daily. Pt needs accu-chek meter. Dx. Ell.9 1 kit 0   buPROPion (WELLBUTRIN XL) 150 MG 24 hr tablet Take 1 tablet (150 mg total) by mouth daily. 30 tablet 1   carvedilol (COREG) 25 MG tablet TAKE 1 TABLET(25 MG) BY MOUTH TWICE DAILY 180 tablet 3   cholecalciferol (VITAMIN D3) 25 MCG (1000 UNIT) tablet Take 1,000 Units by mouth daily.     Continuous Glucose Sensor (FREESTYLE LIBRE 3 PLUS SENSOR) MISC 1 each by Does not apply route continuous. Change every 15 days. 6 each 3   Continuous Glucose Sensor (FREESTYLE LIBRE 3 SENSOR) MISC by Does not apply route.     empagliflozin (JARDIANCE) 25 MG TABS tablet Take 1 tablet (25 mg total) by mouth daily. 90 tablet 3   ENTRESTO 24-26 MG TAKE 1 TABLET BY MOUTH TWICE DAILY 180 tablet 3   Fenofibrate 50 MG CAPS TAKE ONE CAPSULE BY MOUTH DAILY 90 capsule 0   HYDROcodone-acetaminophen (NORCO) 10-325 MG tablet Take 1  tablet by mouth every 6 (six) hours as needed.     insulin degludec (TRESIBA FLEXTOUCH) 100 UNIT/ML FlexTouch Pen Inject 20 Units into the skin daily. 15 mL 3   Insulin Pen Needle (PEN NEEDLES) 32G X 4 MM MISC 1 each by Other route daily. 100 each 2   metFORMIN (GLUCOPHAGE-XR) 500 MG 24 hr tablet Take 4 tablets (2,000 mg total) by mouth daily with breakfast. 360 tablet 3   montelukast (SINGULAIR) 10 MG tablet TAKE 1 TABLET(10 MG) BY MOUTH AT BEDTIME 90 tablet 0   Multiple Vitamin (MULTIVITAMIN WITH MINERALS) TABS tablet Take 1 tablet by mouth daily.     repaglinide (PRANDIN) 1 MG tablet Take 1 tablet (1 mg total) by mouth 3 (three) times daily before meals. 270 tablet 3   spironolactone (ALDACTONE) 25 MG tablet TAKE 1 TABLET(25 MG) BY MOUTH EVERY OTHER DAY 45 tablet 0   SYMBICORT 160-4.5 MCG/ACT inhaler INHALE 2 PUFFS INTO THE LUNGS TWICE DAILY 10.2 g 1   traZODone (DESYREL) 100 MG tablet Take 1 tablet (100 mg total) by mouth at bedtime. 90 tablet 1   XARELTO 20 MG TABS tablet TAKE ONE BY MOUTH DAILY WITH DINNER 90 tablet 1   ACCU-CHEK GUIDE test strip USE TO TEST 1 TO 2 TIMES PER DAY (Patient not taking: Reported on 12/28/2023) 100 strip 3   Accu-Chek Softclix Lancets lancets USE TO TEST 1 TO 2 TIMES PER DAY (Patient not taking: Reported on 12/28/2023) 100 each 3   No current facility-administered medications for this visit.    PHYSICAL EXAM: Vitals:   12/28/23 0957  BP: 134/74  Pulse: 78  Resp: 20  SpO2: 99%  Weight: 184 lb 3.2 oz (83.6 kg)  Height: 5\' 9"  (1.753 m)   Body mass index is 27.2 kg/m.  Wt Readings from Last 3 Encounters:  12/28/23 184 lb 3.2 oz (83.6 kg)  12/14/23 187 lb (  84.8 kg)  08/24/23 186 lb (84.4 kg)    General: Well developed, well nourished male in no apparent distress.  HEENT: AT/[place], no external lesions.  Eyes: Conjunctiva clear and no icterus. Neck: Neck supple  Lungs: Respirations not labored Neurologic: Alert, oriented, normal speech Extremities /  Skin: Dry.  Psychiatric: Does not appear depressed or anxious  Diabetic Foot Exam - Simple   No data filed     LABS Reviewed Lab Results  Component Value Date   HGBA1C 6.6 (A) 12/28/2023   HGBA1C 6.4 (A) 08/24/2023   HGBA1C 7.1 (H) 05/18/2023   No results found for: "FRUCTOSAMINE" Lab Results  Component Value Date   CHOL 130 07/13/2023   HDL 59 07/13/2023   LDLCALC 58 07/13/2023   LDLDIRECT 51.0 06/02/2022   TRIG 62 07/13/2023   CHOLHDL 2.2 07/13/2023   Lab Results  Component Value Date   MICRALBCREAT 709 (H) 12/14/2023   MICRALBCREAT 90.6 (H) 02/16/2023   Lab Results  Component Value Date   CREATININE 1.02 12/14/2023   Lab Results  Component Value Date   GFR 64.87 05/18/2023    ASSESSMENT / PLAN  1. Type 2 diabetes mellitus with hyperglycemia, with long-term current use of insulin (HCC)     Diabetes Mellitus type 2, complicated by diabetic retinopathy /microalbuminuria. - Diabetic status / severity: Controlled.  Lab Results  Component Value Date   HGBA1C 6.6 (A) 12/28/2023    - Hemoglobin A1c goal : <7%  - Medications: See below.  I) decrease Tresiba from 20 to 16 units daily. II) continue Jardiance 25 mg daily. III) continue metformin extended release 2000 mg daily. IV) take Prandin 1 mg as needed with largest meal only.  Do not take in case of increased physical activity or work.  - Home glucose testing: CGM and check as needed. - Discussed/ Gave Hypoglycemia treatment plan.  # Consult : not required at this time.   # Annual urine for microalbuminuria/ creatinine ratio, + microalbuminuria currently, continue ACE/ARB Theodore Bush /NaCl 2 inhibitor.  Will recheck in follow-up visit. Last  Lab Results  Component Value Date   MICRALBCREAT 709 (H) 12/14/2023    # Foot check nightly.  # Patient has diabetic retinopathy, follow-up with ophthalmology.  - Diet: Make healthy diabetic food choices - Life style / activity / exercise: Discussed.  2.  Blood pressure  -  BP Readings from Last 1 Encounters:  12/28/23 134/74    - Control is in target.  - No change in current plans.  3. Lipid status / Hyperlipidemia - Last  Lab Results  Component Value Date   LDLCALC 58 07/13/2023   - Continue atorvastatin 80 mg daily.  On fenofibrate.  Diagnoses and all orders for this visit:  Type 2 diabetes mellitus with hyperglycemia, with long-term current use of insulin (HCC) -     POCT glycosylated hemoglobin (Hb A1C)    DISPOSITION Follow up in clinic in 4 months suggested.  Labs on same day of visit.   All questions answered and patient verbalized understanding of the plan.  Theodore Theodore Robarts, MD [facility] Endocrinology [facility] [address] [place], [address] [place], [place] [address] Phone # [phone]  At least part of this note was generated using voice recognition software. Inadvertent word errors may have occurred, which were not recognized during the proofreading process.

## 2023-12-28 NOTE — Patient Instructions (Signed)
 Diabetes regimen: Decrease tresiba to 16 units daily. Take prandin/ repaglinide only with largest meal / lunch, may not take in case of increase physical activity or work.  Metformin and Jardiance same.

## 2023-12-31 ENCOUNTER — Other Ambulatory Visit: Payer: Self-pay | Admitting: Cardiology

## 2023-12-31 ENCOUNTER — Other Ambulatory Visit: Payer: Self-pay | Admitting: Thoracic Surgery (Cardiothoracic Vascular Surgery)

## 2023-12-31 DIAGNOSIS — Z7901 Long term (current) use of anticoagulants: Secondary | ICD-10-CM

## 2023-12-31 DIAGNOSIS — I4819 Other persistent atrial fibrillation: Secondary | ICD-10-CM

## 2023-12-31 DIAGNOSIS — I7121 Aneurysm of the ascending aorta, without rupture: Secondary | ICD-10-CM

## 2023-12-31 NOTE — Telephone Encounter (Signed)
 Prescription refill request for Xarelto received.  Indication:afib Last office visit:10/24 Weight:83.6  kg Age:73 Scr:1.02  3/25 CrCl:78.55  ml/min  Prescription refilled

## 2024-01-01 ENCOUNTER — Encounter: Payer: Self-pay | Admitting: Endocrinology

## 2024-01-29 ENCOUNTER — Other Ambulatory Visit: Payer: Self-pay | Admitting: Medical

## 2024-02-01 ENCOUNTER — Ambulatory Visit
Admission: RE | Admit: 2024-02-01 | Discharge: 2024-02-01 | Disposition: A | Discharge status: Home / Self Care | Source: Ambulatory Visit | Attending: Thoracic Surgery (Cardiothoracic Vascular Surgery) | Admitting: Thoracic Surgery (Cardiothoracic Vascular Surgery)

## 2024-02-01 DIAGNOSIS — I7121 Aneurysm of the ascending aorta, without rupture: Secondary | ICD-10-CM

## 2024-02-01 MED ORDER — IOPAMIDOL (ISOVUE-370) INJECTION 76%
75.0000 mL | Freq: Once | INTRAVENOUS | Status: AC | PRN
Start: 2024-02-01 — End: 2024-02-01
  Administered 2024-02-01: 75 mL via INTRAVENOUS

## 2024-02-05 ENCOUNTER — Other Ambulatory Visit: Payer: Self-pay | Admitting: Medical

## 2024-02-05 NOTE — Telephone Encounter (Signed)
 Last apt 12/14/23.

## 2024-02-08 ENCOUNTER — Ambulatory Visit
Attending: Thoracic Surgery (Cardiothoracic Vascular Surgery) | Admitting: Thoracic Surgery (Cardiothoracic Vascular Surgery)

## 2024-02-08 DIAGNOSIS — I7121 Aneurysm of the ascending aorta, without rupture: Secondary | ICD-10-CM | POA: Diagnosis not present

## 2024-02-08 NOTE — Progress Notes (Addendum)
     [address]       [place] [address]             [phone]       Patient: Home Provider: Office Consent for Telemedicine visit obtained.  Today's visit was completed via a real-time telehealth (see specific modality noted below). The patient/authorized person provided oral consent at the time of the visit to engage in a telemedicine encounter with the present provider at [facility]. The patient/authorized person was informed of the potential benefits, limitations, and risks of telemedicine. The patient/authorized person expressed understanding that the laws that protect confidentiality also apply to telemedicine. The patient/authorized person acknowledged understanding that telemedicine does not provide emergency services and that he or she would need to call 911 or proceed to the nearest hospital for help if such a need arose.   Total time spent in the clinical discussion 10 minutes.  Telehealth Modality: Phone visit (audio only)  Recent Radiology Findings:   CT ANGIO CHEST AORTA W/CM & OR WO/CM Result Date: 02/01/2024 CLINICAL DATA:  Follow-up thoracic aortic aneurysm. EXAM: CT ANGIOGRAPHY CHEST WITH CONTRAST TECHNIQUE: Multidetector CT imaging of the chest was performed using the standard protocol during bolus administration of intravenous contrast. Multiplanar CT image reconstructions and MIPs were obtained to evaluate the vascular anatomy. RADIATION DOSE REDUCTION: This exam was performed according to the departmental dose-optimization program which includes automated exposure control, adjustment of the mA and/or kV according to patient size and/or use of iterative reconstruction technique. CONTRAST:  75mL ISOVUE -370 IOPAMIDOL  (ISOVUE -370) INJECTION 76% COMPARISON:  02/09/2023 FINDINGS: Cardiovascular: 4.9 cm ascending thoracic aortic aneurysm is seen, which shows no significant change since previous study. No evidence of mural hematoma or thoracic aortic dissection.  No pulmonary emboli identified. Stable mild cardiomegaly and pericardial thickening. Mediastinum/Nodes: No masses or pathologically enlarged lymph nodes identified. Lungs/Pleura: No pulmonary mass, infiltrate, or effusion. Upper abdomen: No acute findings. Musculoskeletal: No suspicious bone lesions identified. Review of the MIP images confirms the above findings. IMPRESSION: Stable 4.9 cm ascending thoracic aortic aneurysm. No evidence of dissection or other acute findings. Recommend semi-annual imaging followup by CTA or MRA and referral to cardiothoracic surgery if not already obtained. This recommendation follows 2010 ACCF/AHA/AATS/ACR/ASA/SCA/SCAI/SIR/STS/SVM Guidelines for the Diagnosis and Management of Patients With Thoracic Aortic Disease. Circulation. 2010; 121: B284-X324. Aortic aneurysm NOS (ICD10-I71.9) Electronically Signed   By: Marlyce Sine M.D.   On: 02/01/2024 12:32    I had a telephone visit with Lorina Roosevelt  Assessment:  73 y.o. male with a 4.9 cm ascending aortic aneurysm.  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  He is currently followed by Dr. [place].  We discussed the natural history and and risk factors for growth of ascending aortic aneurysms.  We covered the importance of smoking cessation, tight blood pressure control, refraining from lifting heavy objects, and avoiding fluoroquinolones.  The patient is aware of signs and symptoms of aortic dissection and when to present to the emergency department.  We will continue surveillance and a repeat CT was ordered for 12 months.  Kaeson Kleinert [place]

## 2024-02-15 ENCOUNTER — Ambulatory Visit: Admitting: Cardiology

## 2024-02-22 ENCOUNTER — Inpatient Hospital Stay: Admission: RE | Admit: 2024-02-22 | Payer: Medicare Other | Source: Ambulatory Visit

## 2024-03-12 NOTE — Progress Notes (Signed)
 Cardiology Office Note   Date:  03/17/2024   ID:  Theodore Bush, DOB 03-16-1951, MRN [id number]  PCP:  Bulah Alm RAMAN, PA-C  Cardiologist:   Shanena Pellegrino [place], MD   Chief Complaint  Patient presents with   Congestive Heart Failure   Atrial Fibrillation   Coronary Artery Disease      History of Present Illness: Theodore Bush is a 73 y.o. male who is seen for follow up  of CHF and history of Afib.  Patient moved to [facility]  to be closer to family. Moved from [place] [facility]. He has a history of atrial fibrillation, CHF and CAD. He reports he has been admitted a couple of times for PNA and at one time had fluid in the lungs and required thoracentesis. He has a difficult time remembering when he was hospitalized or any details about prior cardiac work up. States he did have a stress test at some point in the past. We do have records of a chest CT done for a pulmonary nodule that showed severe coronary calcification. No prior history of MI. He has a history of AFib.  Initially on coumadin but later switched to Xarelto . 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 He has a history of DM on insulin , HTN, and HLD. Has CKD stage 3. Has a thoracic aortic aneurysm.   An Echo was obtained here in 2020 showing an EF of 35-40%.Global hypokinesis. Aortic root enlarged to 49 mm. CT chest done showing aneurysm of 4.7 cm. Coronary calcification. Scattered pulmonary nodules. Evidence of cirrhosis. Heart failure therapy has been optimized with Entresto , Jardiance , coreg , and aldactone . Repeat Echo in July 2023 showed no change. Last CT chest in May showed 4.9 thoracic aneurysm. Followed by Dr Shyrl. Seen in May and CT showed no change in aneurysm 4.9 cm.  On follow up today he is doing well. 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 Denies any palpitations, dizziness, chest pain or SOB. No edema.   Feels well overall. Did stop drinking hard liquor but  still drinks a couple of beers.      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   Asthma    Atrial fibrillation (HCC) 09/02/2018   CHF (congestive heart failure) (HCC)    Chronic kidney disease 06/11/2019   Cirrhosis (HCC)    Controlled diabetes mellitus type 2 with complications (HCC) 09/02/2018   Controlled type 2 diabetes mellitus with complication, with long-term current use of insulin  (HCC) 09/02/2018   Coronary artery calcification 06/11/2019   Hyperlipidemia    Hypertension    Insomnia 06/11/2019   Pneumonia 2019   Sleep apnea    he was 280lb at the time   Staph infection    to elbow 15-63yrs ago   Type 2 diabetes mellitus with hyperglycemia, without long-term current use of insulin  (HCC) 02/03/2022    Past Surgical History:  Procedure Laterality Date   COLONOSCOPY  12/2021   PALATE / UVULA BIOPSY / EXCISION     trimmed several times     Current Outpatient Medications  Medication Sig Dispense Refill   ACCU-CHEK GUIDE test strip USE TO TEST 1 TO 2 TIMES PER DAY 100 strip 3   Accu-Chek Softclix Lancets lancets USE TO TEST 1 TO 2 TIMES PER DAY 100 each 3   Adalimumab 40 MG/0.4ML PNKT Inject 1 each into the skin. 2 times  a months.     albuterol  (VENTOLIN  HFA) 108 (90 Base) MCG/ACT inhaler INHALE 1 PUFF INTO THE LUNGS EVERY 6 HOURS AS NEEDED FOR WHEEZING OR SHORTNESS OF BREATH 18 g 1   atorvastatin  (LIPITOR) 80 MG tablet TAKE 1 TABLET(80 MG) BY MOUTH DAILY 90 tablet 3   Blood Glucose Monitoring Suppl (ACCU-CHEK GUIDE ME) w/Device KIT Test 1-2 times daily. Pt needs accu-chek meter. Dx. Ell.9 1 kit 0   budesonide -formoterol  (SYMBICORT ) 160-4.5 MCG/ACT inhaler INHALE 2 PUFFS INTO THE LUNGS TWICE DAILY 10.2 g 2   buPROPion  (WELLBUTRIN  XL) 150 MG 24 hr tablet TAKE 1 TABLET(150 MG) BY MOUTH DAILY 90 tablet 0   carvedilol  (COREG ) 25 MG tablet TAKE 1 TABLET(25 MG) BY MOUTH TWICE DAILY 180 tablet 3   cholecalciferol (VITAMIN D3) 25 MCG  (1000 UNIT) tablet Take 1,000 Units by mouth daily.     Continuous Glucose Sensor (FREESTYLE LIBRE 3 PLUS SENSOR) MISC 1 each by Does not apply route continuous. Change every 15 days. 6 each 3   Continuous Glucose Sensor (FREESTYLE LIBRE 3 SENSOR) MISC by Does not apply route.     empagliflozin  (JARDIANCE ) 25 MG TABS tablet Take 1 tablet (25 mg total) by mouth daily. 90 tablet 3   ENTRESTO  24-26 MG TAKE 1 TABLET BY MOUTH TWICE DAILY 180 tablet 3   Fenofibrate  50 MG CAPS TAKE ONE CAPSULE BY MOUTH DAILY 90 capsule 0   HYDROcodone-acetaminophen (NORCO) 10-325 MG tablet Take 1 tablet by mouth every 6 (six) hours as needed.     insulin  degludec (TRESIBA  FLEXTOUCH) 100 UNIT/ML FlexTouch Pen Inject 20 Units into the skin daily. 15 mL 3   Insulin  Pen Needle (PEN NEEDLES) 32G X 4 MM MISC 1 each by Other route daily. 100 each 2   metFORMIN  (GLUCOPHAGE -XR) 500 MG 24 hr tablet Take 4 tablets (2,000 mg total) by mouth daily with breakfast. 360 tablet 3   montelukast  (SINGULAIR ) 10 MG tablet TAKE 1 TABLET(10 MG) BY MOUTH AT BEDTIME 90 tablet 0   Multiple Vitamin (MULTIVITAMIN WITH MINERALS) TABS tablet Take 1 tablet by mouth daily.     repaglinide  (PRANDIN ) 1 MG tablet Take 1 tablet (1 mg total) by mouth 3 (three) times daily before meals. 270 tablet 3   spironolactone  (ALDACTONE ) 25 MG tablet TAKE 1 TABLET(25 MG) BY MOUTH EVERY OTHER DAY (Patient taking differently: Take 18 mg by mouth once.) 45 tablet 0   traZODone  (DESYREL ) 100 MG tablet TAKE ONE TABLET (100 MG TOTAL) BY MOUTH AT BEDTIME. 90 tablet 0   XARELTO  20 MG TABS tablet TAKE ONE BY MOUTH DAILY WITH DINNER 90 tablet 1   No current facility-administered medications for this visit.    Allergies:   Quinolones    Social History:  The patient  reports that he has never smoked. His smokeless tobacco use includes snuff. He reports current alcohol use of about 10.0 standard drinks of alcohol per week. He reports current drug use. Drug: Hydrocodone.    Family History:  The patient's family history includes Diabetes in his father and paternal grandmother; Hypertension in his mother; Lupus in his mother.    ROS:  Please see the history of present illness.   Otherwise, review of systems are positive for none.   All other systems are reviewed and negative.    PHYSICAL EXAM: VS:  BP 126/84 (BP Location: Left Arm, Patient Position: Sitting)   Pulse 75   Ht 5' 8 (1.727 m)   Wt 184 lb 6.4 oz (  83.6 kg)   SpO2 97%   BMI 28.04 kg/m  , BMI Body mass index is 28.04 kg/m. GEN: Well nourished, well developed, in no acute distress  HEENT: normal  Neck: no JVD, carotid bruits, or masses Cardiac: IRRR; no murmurs, rubs, or gallops,no edema  Respiratory:  Few scattered wheezes.  GI: soft, nontender, nondistended, + BS MS: no deformity or atrophy  Skin: warm and dry, no rash Neuro:  Strength and sensation are intact Psych: euthymic mood, full affect         Recent Labs: 12/14/2023: ALT 25; BUN 22; Creatinine, Ser 1.02; Hemoglobin 15.4; Platelets 163; Potassium 4.4; Sodium 139    Lipid Panel    Component Value Date/Time   CHOL 130 07/13/2023 1038   TRIG 62 07/13/2023 1038   HDL 59 07/13/2023 1038   CHOLHDL 2.2 07/13/2023 1038   LDLCALC 58 07/13/2023 1038   LDLDIRECT 51.0 06/02/2022 1052      Wt Readings from Last 3 Encounters:  03/17/24 184 lb 6.4 oz (83.6 kg)  12/28/23 184 lb 3.2 oz (83.6 kg)  12/14/23 187 lb (84.8 kg)      Other studies Reviewed: Additional studies/ records that were reviewed today include:   Echo 06/26/19: IMPRESSIONS      1. Left ventricular ejection fraction, by visual estimation, is 35 to 40%. The left ventricle has moderately decreased function. Normal left ventricular size. There is mildly increased left ventricular hypertrophy. Diffuse hypokinesis.  2. Left ventricular diastolic Doppler parameters are indeterminate pattern of LV diastolic filling. Rhythm uncertain.  3. Trivial pericardial  effusion is present.  4. Moderate mitral annular calcification.  5. The mitral valve is normal in structure. Trace mitral valve regurgitation. No evidence of mitral stenosis.  6. The tricuspid valve is normal in structure. Tricuspid valve regurgitation is trivial.  7. The aortic valve is tricuspid Aortic valve regurgitation is mild by color flow Doppler. Mild to moderate aortic valve sclerosis/calcification without any evidence of aortic stenosis.  8. There is severe dilatation of the ascending aorta measuring 49 mm. Suggest MRA or CTA chest to further evaluate.  9. Global right ventricle has mildly reduced systolic function.The right ventricular size is normal. No increase in right ventricular wall thickness. 10. Left atrial size was moderately dilated. 11. Right atrial size was mildly dilated. 12. The inferior vena cava is normal in size with greater than 50% respiratory variability, suggesting right atrial pressure of 3 mmHg. 13. TR signal is inadequate for assessing pulmonary artery systolic pressure.   Chest CT 07/04/19: IMPRESSION: 1. No acute intrathoracic pathology. No CT evidence of aortic dissection. 2. Dilated ascending aorta measuring up to 4.7 cm in diameter. Ascending thoracic aortic aneurysm. Recommend semi-annual imaging followup by CTA or MRA and referral to cardiothoracic surgery if not already obtained. This recommendation follows 2010 ACCF/AHA/AATS/ACR/ASA/SCA/SCAI/SIR/STS/SVM Guidelines for the Diagnosis and Management of Patients With Thoracic Aortic Disease. Circulation. 2010; 121: Z733-z630. Aortic aneurysm NOS (ICD10-I71.9) 3. Cardiomegaly with 3 vessel coronary vascular calcification. 4. Small pericardial effusion. 5. Several small pulmonary nodules measure up to 4 mm. No follow-up needed if patient is low-risk (and has no known or suspected primary neoplasm). Non-contrast chest CT can be considered in 12 months if patient is high-risk. This recommendation follows  the consensus statement: Guidelines for Management of Incidental Pulmonary Nodules Detected on CT Images: From the Fleischner Society 2017; Radiology 2017; 284:228-243. 6. Cirrhosis.  CLINICAL DATA:  Follow-up thoracic aortic aneurysm.   EXAM: CT ANGIOGRAPHY CHEST WITH CONTRAST   TECHNIQUE:  Multidetector CT imaging of the chest was performed using the standard protocol during bolus administration of intravenous contrast. Multiplanar CT image reconstructions and MIPs were obtained to evaluate the vascular anatomy.   CONTRAST:  OMNIPAQUE  IOHEXOL  350 MG/ML SOLN   COMPARISON:  Chest CTA 07/04/2019   FINDINGS: Cardiovascular: Aortic root at the sinuses of Valsalva measures 4.5 cm. Sinotubular junction measures 3.4 cm. Fusiform aneurysm of the ascending thoracic aorta measures up to 4.9 cm on sequence 4, image 57 and this is unchanged from the prior examination. Coronary artery calcifications. Aortic arch measures 3.5 cm and stable. Great vessels are patent. Proximal vertebral arteries are patent. Incidentally, the left vertebral artery originates from the aortic arch near the origin of the left subclavian artery. Atherosclerotic calcifications involving the thoracic aorta. Descending thoracic aorta measures 3.3 cm and stable. Distal descending thoracic aorta measures 2.9 cm and stable. Negative for aortic dissection. Proximal abdominal aorta is patent with atherosclerotic disease. Variant celiac artery anatomy. There is a common trunk for the left gastric artery and splenic artery. Common hepatic artery originates directly from the abdominal aorta. SMA is widely patent at the origin. Bilateral renal arteries are patent with calcified plaque at the origin but no significant stenosis. Mitral annular calcifications. Small amount of pericardial fluid. Main pulmonary artery is enlarged measuring up to 4.2 cm. Main and central pulmonary arteries are patent.   Mediastinum/Nodes:  Thyroid tissue is unremarkable. No enlarged mediastinal or hilar lymph nodes. No enlarged axillary lymph nodes.   Lungs/Pleura: Filling defect along the posterior aspect of the upper trachea and a small linear filling defect extending into the right mainstem bronchus. These endobronchial filling defects likely represent adherent mucus. No significant pleural effusions. Stable tiny calcified granuloma in the anterior right upper lobe on sequence 6 image 59. Stable linear density in the right lower lobe on sequence 6, image 85. Focal volume loss and possible scarring along posterior aspect of the left upper lobe and lingular region. Stable tiny peripheral nodular density in the left lower lobe on sequence 6, image 116. No significant airspace disease or consolidation in the lungs.   Upper Abdomen: Chronic calcification in the right hepatic lobe. Liver contour is slightly nodular particularly along the anterior left hepatic lobe. No acute abnormality in the upper abdomen.   Musculoskeletal: Degenerative changes in lower cervical spine. Bridging osteophytes in thoracic spine.   Review of the MIP images confirms the above findings.   IMPRESSION: 1. 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 Recommend semi-annual imaging followup by CTA or MRA and referral to cardiothoracic surgery if not already obtained. This recommendation follows 2010 ACCF/AHA/AATS/ACR/ASA/SCA/SCAI/SIR/STS/SVM Guidelines for the Diagnosis and Management of Patients With Thoracic Aortic Disease. Circulation. 2010; 121: Z733-z630. Aortic aneurysm NOS (ICD10-I71.9) 2. Liver contour is slightly nodular particularly in the anterior left hepatic lobe. Early cirrhotic changes cannot be excluded. 3. Enlarged pulmonary arteries. Findings can be associated with pulmonary hypertension. 4. Aortic Atherosclerosis (ICD10-I70.0). Coronary artery calcifications.     Electronically Signed    By: Juliene Balder M.D.   On: 07/08/2020 10:29  Echo 04/07/22: IMPRESSIONS     1. Left ventricular ejection fraction, by estimation, is 35 to 40%. Left  ventricular ejection fraction by 3D volume is 36 %. The left ventricle has  moderately decreased function. The left ventricle demonstrates global  hypokinesis. The left ventricular  internal cavity size was mildly dilated. There is mild concentric left  ventricular hypertrophy. Left ventricular diastolic parameters  are  consistent with Grade II diastolic dysfunction (pseudonormalization).   2. Right ventricular systolic function is normal. The right ventricular  size is normal. There is mildly elevated pulmonary artery systolic  pressure. The estimated right ventricular systolic pressure is 44.5 mmHg.   3. Left atrial size was severely dilated.   4. Right atrial size was mild to moderately dilated.   5. A small pericardial effusion is present. The pericardial effusion is  circumferential. There is no evidence of cardiac tamponade.   6. The mitral valve is grossly normal. Mild to moderate mitral valve  regurgitation. The mean mitral valve gradient is 3.2 mmHg with average  heart rate of 60 bpm. Moderate mitral annular calcification.   7. Tricuspid valve regurgitation is mild to moderate.   8. The aortic valve is tricuspid. Aortic valve regurgitation is mild to  moderate. Aortic valve sclerosis is present, with no evidence of aortic  valve stenosis.   9. Pulmonic valve regurgitation is moderate.  10. Aortic dilatation noted. There is mild dilatation of the aortic root,  measuring 43 mm. There is severe dilatation of the ascending aorta,  measuring 51 mm.  11. The inferior vena cava is dilated in size with >50% respiratory  variability, suggesting right atrial pressure of 8 mmHg.   Comparison(s): Further dilation of the ascending aorta.   Conclusion(s)/Recommendation(s): Has recent CT Aorta that correlates with  similar aortic size.    ASSESSMENT AND PLAN:  1.  CHF. Chronic systolic. EF 35-40%.  he is   well compensated on aldactone , Coreg , and Entresto . Also on Jardiance . Suspect LV dysfunction may be related to history of Etoh abuse. Congratulated on cessation.  Sodium restriction.  2. Atrial fibrillation- persistent/permanent. HR well controlled.  Continue Coreg  and Xarelto . Labs ok 3. CAD. Based on heavy coronary calcification on CT. No active angina.  4. HLD. On statin with excellent control. LDL 58 5. DM on insulin  per primary care. On metformin  and Jardiance . Last A1c 6.6%.  6. Asthma/COPD. on inhalers 7. CKD stage 3. Last creatinine 1.02 8. HTN well controlled.  9. Thoracic aortic aneurysm. 4.9 cm. Will need yearly CT. SABRA  Recommend avoidance of fluoroquinolone antibiotics. Followed by Dr Shyrl.  10. History of Etoh abuse. Cirrhosis noted on CT. Recommend abstinence.      Current medicines are reviewed at length with the patient today.  The patient does not have concerns regarding medicines.  The following changes have been made:  no change  Labs/ tests ordered today include:   No orders of the defined types were placed in this encounter.    Disposition:   FU  in 6 months  Signed, Yecheskel Kurek [place], MD  03/17/2024 9:05 AM    [facility] [address] [place], [place], [address] Phone [phone], Fax [phone]

## 2024-03-14 ENCOUNTER — Other Ambulatory Visit: Payer: Self-pay | Admitting: Medical

## 2024-03-17 ENCOUNTER — Ambulatory Visit: Attending: Cardiology | Admitting: Cardiology

## 2024-03-17 ENCOUNTER — Encounter: Payer: Self-pay | Admitting: Cardiology

## 2024-03-17 VITALS — BP 126/84 | HR 75 | Ht 68.0 in | Wt 184.4 lb

## 2024-03-17 DIAGNOSIS — I5022 Chronic systolic (congestive) heart failure: Secondary | ICD-10-CM | POA: Diagnosis not present

## 2024-03-17 DIAGNOSIS — I7121 Aneurysm of the ascending aorta, without rupture: Secondary | ICD-10-CM

## 2024-03-17 DIAGNOSIS — I4819 Other persistent atrial fibrillation: Secondary | ICD-10-CM

## 2024-03-17 DIAGNOSIS — I10 Essential (primary) hypertension: Secondary | ICD-10-CM | POA: Diagnosis not present

## 2024-03-17 NOTE — Patient Instructions (Addendum)
 Medication Instructions:  Continue same medications *If you need a refill on your cardiac medications before your next appointment, please call your pharmacy*  Lab Work: None ordered  Testing/Procedures: None ordered  Follow-Up: At [facility], you and your health needs are our priority.  As part of our continuing mission to provide you with exceptional heart care, our providers are all part of one team.  This team includes your primary Cardiologist (physician) and Advanced Practice Providers or APPs (Physician Assistants and Nurse Practitioners) who all work together to provide you with the care you need, when you need it.  Your next appointment:  6 months     Monday 12/8 at 11:00 am    Provider:  Dr.Jordan   We recommend signing up for the patient portal called MyChart.  Sign up information is provided on this After Visit Summary.  MyChart is used to connect with patients for Virtual Visits (Telemedicine).  Patients are able to view lab/test results, encounter notes, upcoming appointments, etc.  Non-urgent messages can be sent to your provider as well.   To learn more about what you can do with MyChart, go to [email or web address].

## 2024-03-18 ENCOUNTER — Other Ambulatory Visit: Payer: Self-pay | Admitting: Medical

## 2024-03-25 ENCOUNTER — Other Ambulatory Visit: Payer: Self-pay | Admitting: Medical

## 2024-03-28 ENCOUNTER — Other Ambulatory Visit: Payer: Self-pay | Admitting: Cardiology

## 2024-04-01 ENCOUNTER — Other Ambulatory Visit (HOSPITAL_BASED_OUTPATIENT_CLINIC_OR_DEPARTMENT_OTHER)

## 2024-04-09 ENCOUNTER — Other Ambulatory Visit: Payer: Self-pay | Admitting: Medical

## 2024-04-09 DIAGNOSIS — J4540 Moderate persistent asthma, uncomplicated: Secondary | ICD-10-CM

## 2024-04-09 IMAGING — CT CT ANGIO CHEST
3 of 6 series · 18 of 46 positions shown · IV contrast (agent unspecified)
Comparison: 07/07/2020

CLINICAL DATA: Aortic aneurysm

EXAM:
CT ANGIOGRAPHY CHEST WITH CONTRAST
TECHNIQUE: Multidetector CT imaging of the chest was performed using the
standard protocol during bolus administration of intravenous
contrast. Multiplanar CT image reconstructions and MIPs were
obtained to evaluate the vascular anatomy.

[Series 4: axial arterial · axial · arterial · 0.80mm/px · z∈[+1355,+1613]mm · 11 of 104 slices shown]
[im 9/104  lung]
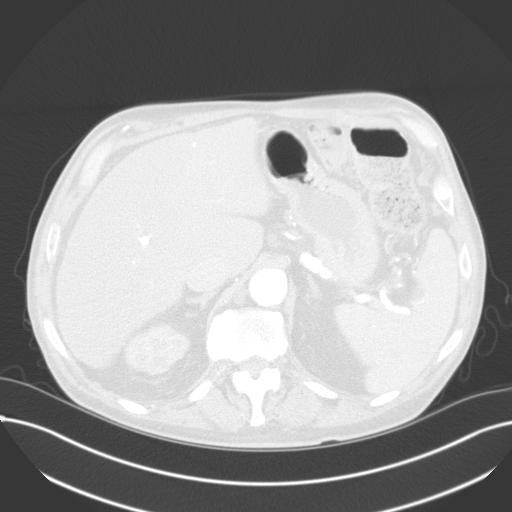
[im 18/104  soft-tissue]
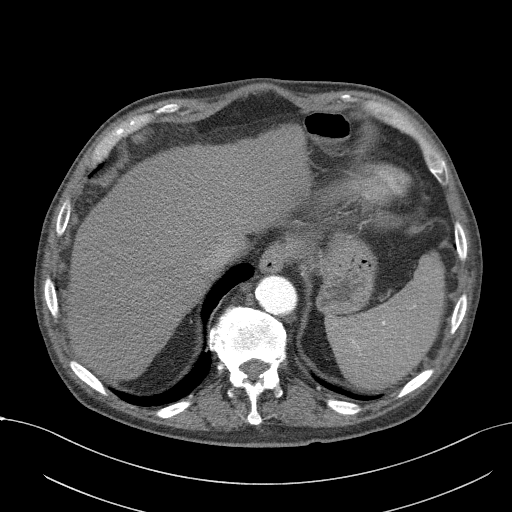
[im 26/104  lung]
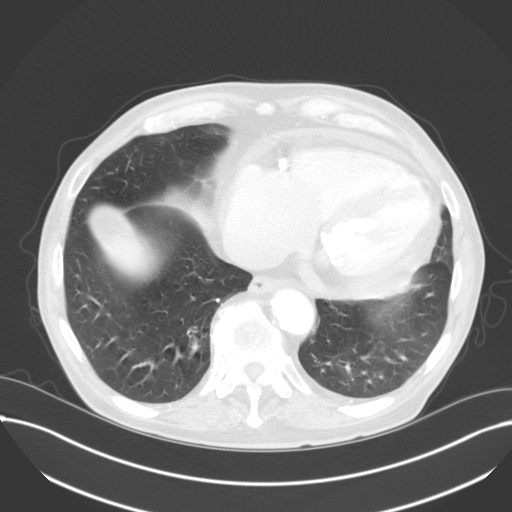
[im 35/104  soft-tissue]
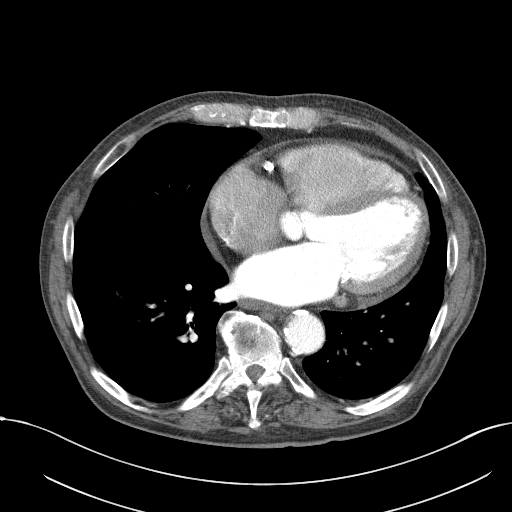
[im 43/104  lung]
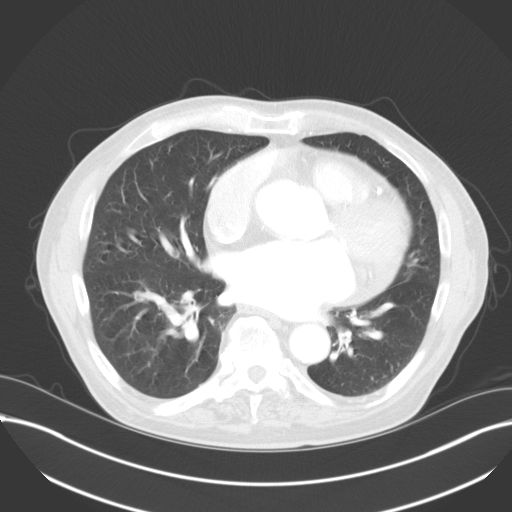
[im 52/104  soft-tissue]
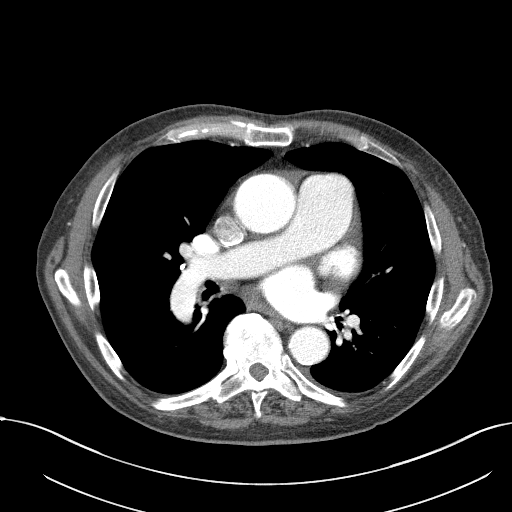
[im 61/104  lung]
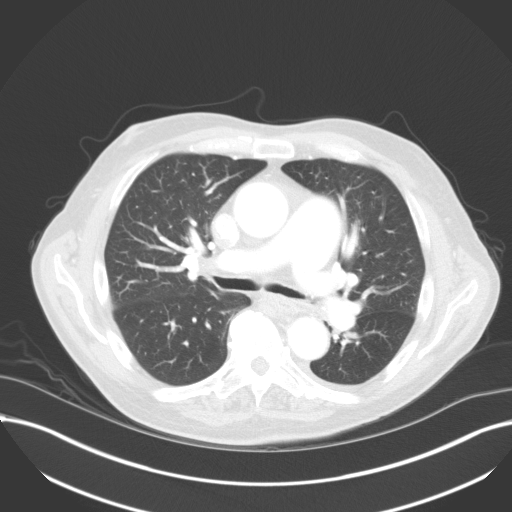
[im 69/104  soft-tissue]
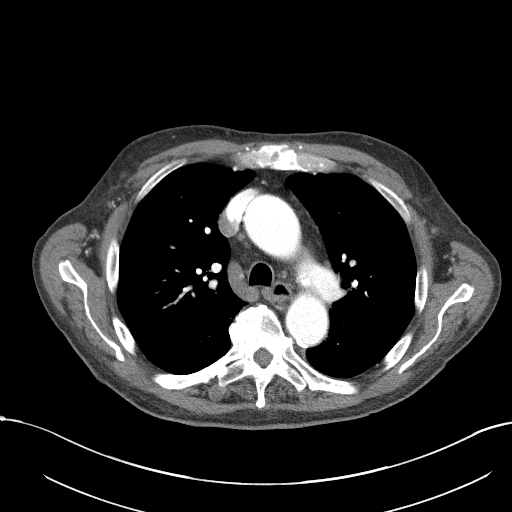
[im 78/104  lung]
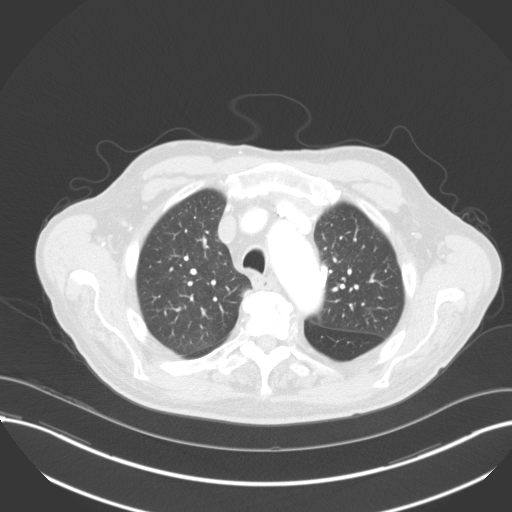
[im 86/104  soft-tissue]
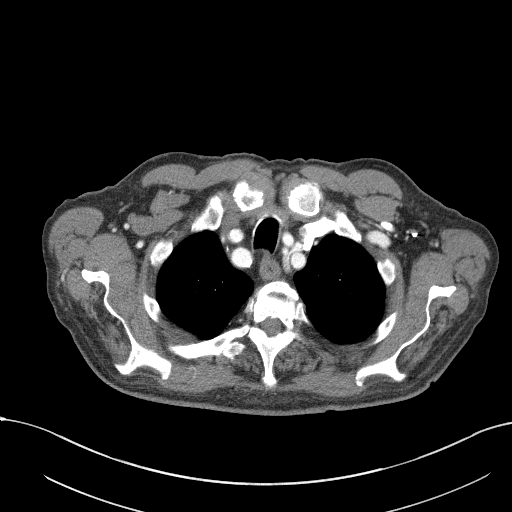
[im 95/104  lung]
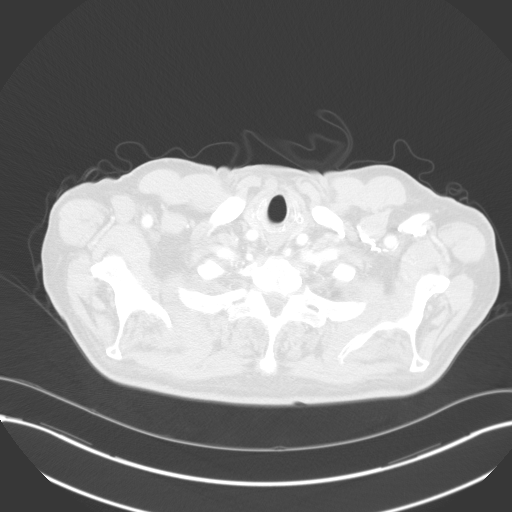

[Series 6: lung · axial · 0.80mm/px · z∈[+1364,+1466]mm · 4 of 156 slices shown]
[im 18/156  soft-tissue]
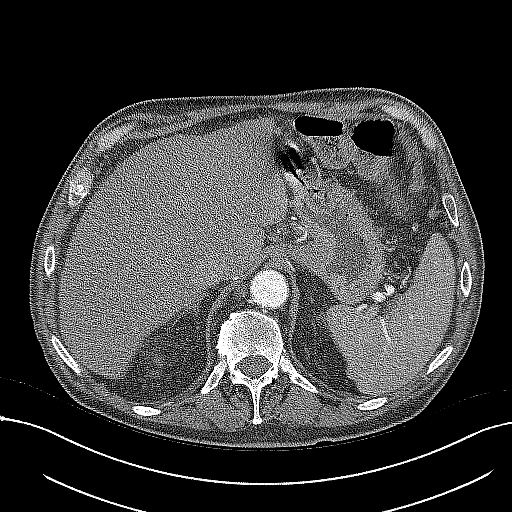
[im 35/156  soft-tissue]
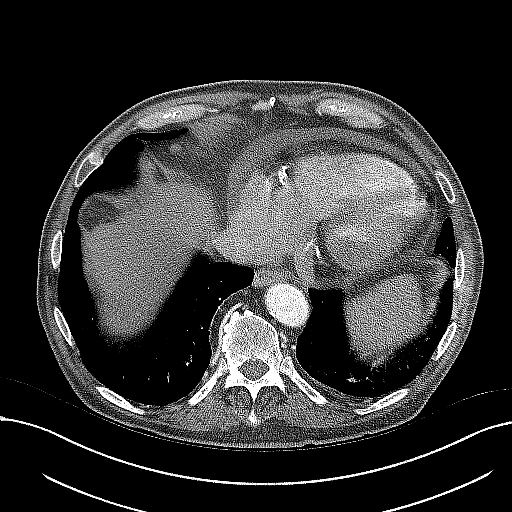
[im 52/156  soft-tissue]
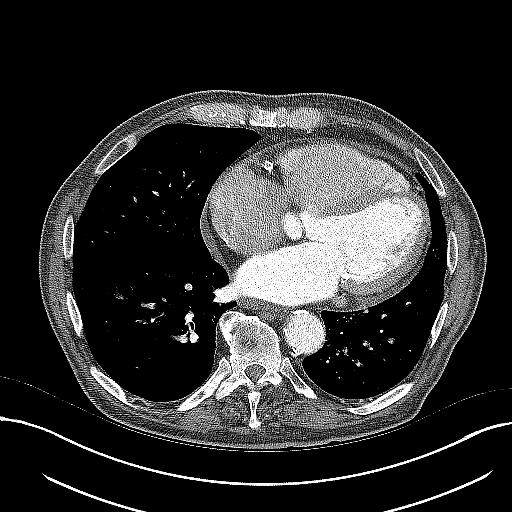
[im 69/156  soft-tissue]
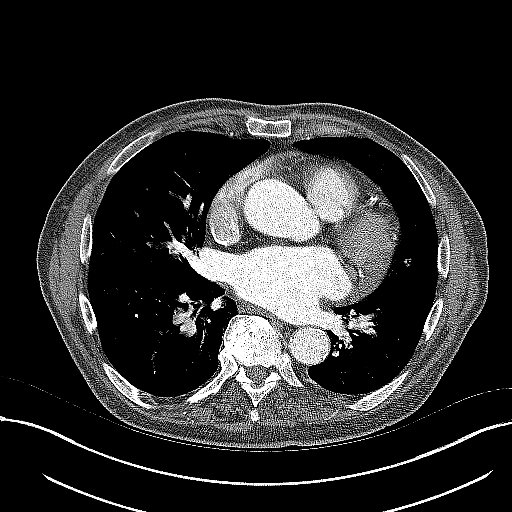

[Series 7: coronal · coronal · 0.64mm/px · 3 of 101 slices shown]
[im 26/101  soft-tissue]
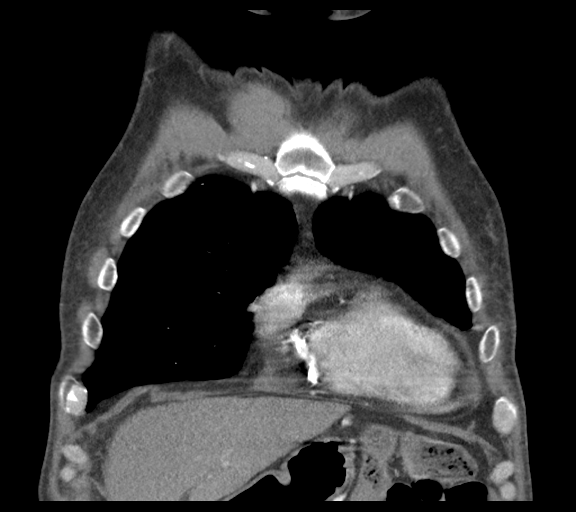
[im 51/101  soft-tissue]
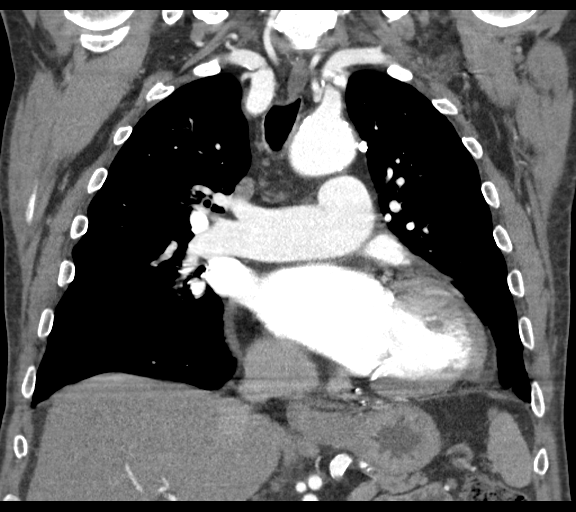
[im 76/101  soft-tissue]
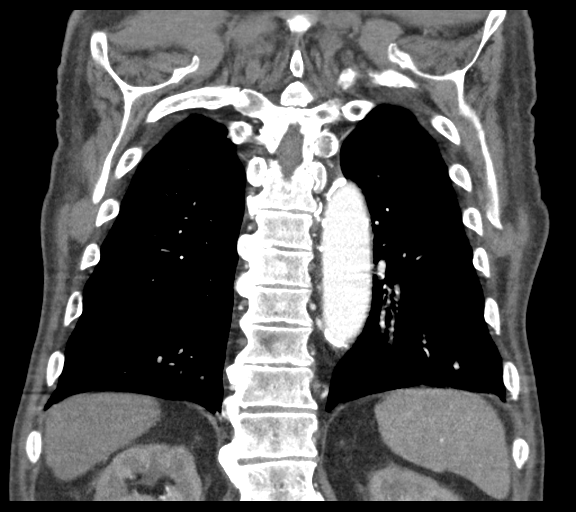

[18 of 46 positions shown; findings below may reference images not displayed]

RADIATION DOSE REDUCTION: This exam was performed according to the
departmental dose-optimization program which includes automated
exposure control, adjustment of the mA and/or kV according to
patient size and/or use of iterative reconstruction technique.

CONTRAST:  80mL OMNIPAQUE IOHEXOL 350 MG/ML SOLN
FINDINGS: Cardiovascular: There are scattered calcifications in the thoracic
aorta. There is homogeneous enhancement in thoracic aorta. There is
dilation of ascending thoracic aorta measuring up to 5 cm in maximum
diameter. Ascending thoracic aorta measured 4.9 cm in maximum
diameter in the previous study. Aortic arch measures 3.8 cm.
Descending thoracic aorta measures 3.4 cm. There is ectasia of main
pulmonary artery measuring 5 cm suggesting pulmonary arterial
hypertension. Coronary artery calcifications are seen. Small
pericardial effusion is present. Heart is enlarged in size.

Mediastinum/Nodes: No significant lymphadenopathy seen.

Lungs/Pleura: There is breathing motion in the some of the images
limiting evaluation. As far as seen there is no focal pulmonary
consolidation. There is 2 mm calcified nodule in the anterior right
upper lobe in image 57 of series 6 with no change. In image 73,
there is small linear pleural-based density in the right lower lobe
which has not changed significantly. There is interval decrease in
small patchy infiltrate in the lingula. No new lung nodules are
seen. There is no pleural effusion or pneumothorax.

Upper Abdomen: There is fatty infiltration in the liver. Possible
calcified granuloma is seen in the liver. There is mild nodularity
in the liver surface. Spleen measures 14.3 cm in maximum diameter.

Musculoskeletal: Degenerative changes are noted in the cervical and
thoracic spine.

Review of the MIP images confirms the above findings.
IMPRESSION: There is aneurysmal dilation of ascending thoracic aorta measuring 5
cm in maximum diameter. There is ectasia of main pulmonary artery
suggesting pulmonary arterial hypertension. Coronary artery
calcifications are seen. Cardiomegaly. Small pericardial effusion.

There is no focal pulmonary consolidation. No new lung nodules are
seen. No new significant lymphadenopathy is seen.

Fatty liver. Mild nodularity in the liver surface suggests possible
cirrhosis. Splenomegaly.

## 2024-04-22 ENCOUNTER — Other Ambulatory Visit: Payer: Self-pay | Admitting: Cardiology

## 2024-04-29 ENCOUNTER — Other Ambulatory Visit: Payer: Self-pay | Admitting: Medical

## 2024-05-02 ENCOUNTER — Ambulatory Visit (INDEPENDENT_AMBULATORY_CARE_PROVIDER_SITE_OTHER): Admitting: Endocrinology

## 2024-05-02 ENCOUNTER — Encounter: Payer: Self-pay | Admitting: Endocrinology

## 2024-05-02 ENCOUNTER — Ambulatory Visit: Payer: Self-pay | Admitting: Endocrinology

## 2024-05-02 VITALS — BP 118/70 | HR 75 | Resp 20 | Ht 68.0 in | Wt 183.4 lb

## 2024-05-02 DIAGNOSIS — E1165 Type 2 diabetes mellitus with hyperglycemia: Secondary | ICD-10-CM

## 2024-05-02 DIAGNOSIS — Z794 Long term (current) use of insulin: Secondary | ICD-10-CM

## 2024-05-02 LAB — POCT GLYCOSYLATED HEMOGLOBIN (HGB A1C): Hemoglobin A1C: 7 % — AB (ref 4.0–5.6)

## 2024-05-02 NOTE — Progress Notes (Signed)
 Outpatient Endocrinology Note [place] Gradyn Shein, MD  05/02/24  Patient's Name: Lasean Gorniak    DOB: [birth year]    MRN: [id number]                                                    REASON OF VISIT: Follow up for type 2 diabetes mellitus  PCP: Bulah Alm RAMAN, PA-C  HISTORY OF PRESENT ILLNESS:   Theodore Bush is a 73 y.o. old male with past medical history listed below, is here for follow up for type 2 diabetes mellitus.   Pertinent Diabetes History: Patient was previously seen by Dr. Von and was last time seen in May 2024.  Patient was diagnosed with type 2 diabetes mellitus in 1995.  He has been on insulin therapy since 2018.  Chronic Diabetes Complications : Retinopathy: yes, moderate non proliferative diabetic retinopathy, following with ophthalmology regularly. Every 6 months, Dr. Octavia.  Nephropathy: Microalbuminuria, on Jardiance patsie. Peripheral neuropathy: no Coronary artery disease: yes Stroke: no  Relevant comorbidities and cardiovascular risk factors: Obesity: no Body mass index is 27.89 kg/m.  Hypertension: Yes  Hyperlipidemia : Yes, on statin   Current / Home Diabetic regimen includes:  Non-insulin hypoglycemic drugs:  metformin XR 2000 mg daily, Jardiance 25 mg daily.  Prandin /repaglinide 1 mg with meals three times a day.  Prandin was started in May 2024.     Insulin regimen: Tresiba 18 units once a day  in the morning.      Prior diabetic medications: Bernadine was started in 2018.  He had been on Januvia, linagliptin in the past.  He had nausea and vomiting with 5 mg of Mounjaro and Ozempic 0.25 mg weekly.  He reported significant weight loss with Ozempic.  Glycemic data:    CONTINUOUS GLUCOSE MONITORING SYSTEM (CGMS) INTERPRETATION: At today's visit, we reviewed CGM downloads. The full report is scanned in the media. Reviewing the CGM trends, blood glucose are as follows:  FreeStyle Libre 3+ CGM-  Sensor Download (Sensor download was reviewed  and summarized below.) Dates: August 2 to August 15 , 2025, 14 days Sensor Average: 158  Glucose Management Indicator: 7.1%  % data captured: 96%    Interpretation: Mostly acceptable blood sugar.  He has a random hyperglycemia with blood sugar up to 250-300s related to high carb meal usually with supper and occasionally in the afternoons. Rare hypoglycemia with blood sugar in 60s randomly.  No significant hypoglycemia.  Hypoglycemia: Patient has minor hypoglycemic episodes. Patient has hypoglycemia awareness.  Factors modifying glucose control: 1.  Diabetic diet assessment: 3 meals a day.  2.  Staying active or exercising: Busy at work, is still working in Holiday representative.  3.  Medication compliance: compliant all of the time.  Interval history  CGM data as reviewed above.  Random hyperglycemia and hypoglycemia.  Otherwise mostly acceptable blood sugar.  Diabetes regimen as reviewed and noted above.  Patient reports he recently quit drinking alcohol.  He is still occasionally drinking beer.  And he reports after quitting alcohol he has been eating large meals at times causing hyperglycemia.  Discussed about limiting carbohydrates and portion control.  Congratulated him for quitting alcohol.  No other complaints today.  REVIEW OF SYSTEMS As per history of present illness.   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   Asthma    Atrial fibrillation (HCC) 09/02/2018   CHF (congestive heart failure) (HCC)    Chronic kidney disease 06/11/2019   Cirrhosis (HCC)    Controlled diabetes mellitus type 2 with complications (HCC) 09/02/2018   Controlled type 2 diabetes mellitus with complication, with long-term current use of insulin (HCC) 09/02/2018   Coronary artery calcification 06/11/2019   Hyperlipidemia    Hypertension    Insomnia 06/11/2019   Pneumonia 2019   Sleep apnea    he was 280lb at the time   Staph infection     to elbow 15-54yrs ago   Type 2 diabetes mellitus with hyperglycemia, without long-term current use of insulin (HCC) 02/03/2022    PAST SURGICAL HISTORY: Past Surgical History:  Procedure Laterality Date   COLONOSCOPY  12/2021   PALATE / UVULA BIOPSY / EXCISION     trimmed several times    ALLERGIES: Allergies  Allergen Reactions   Quinolones Other (See Comments)    FAMILY HISTORY:  Family History  Problem Relation Age of Onset   Lupus Mother    Hypertension Mother    Diabetes Father    Diabetes Paternal Grandmother    Colon cancer Neg Hx    Stomach cancer Neg Hx    Esophageal cancer Neg Hx    Rectal cancer Neg Hx     SOCIAL HISTORY: Social History   Socioeconomic History   Marital status: Married    Spouse name: Not on file   Number of children: 2   Years of education: Not on file   Highest education level: Not on file  Occupational History   Occupation: Retired    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   Sexual activity: Not on file  Other Topics Concern   Not on file  Social History Narrative   Lives with wife Tammy.   Son and a daughter in [place].  Has several grandchildren.   Pipe fitter, still works.   11/2023   Social Drivers of Health   Financial Resource Strain: Low Risk  (06/15/2023)   Overall Financial Resource Strain (CARDIA)    Difficulty of Paying Living Expenses: Not hard at all  Food Insecurity: No Food Insecurity (06/15/2023)   Hunger Vital Sign    Worried About Running Out of Food in the Last Year: Never true    Ran Out of Food in the Last Year: Never true  Transportation  Needs: No Transportation Needs (06/15/2023)   PRAPARE - Administrator, Civil Service (Medical): No    Lack of Transportation (Non-Medical): No  Physical Activity: Unknown (06/15/2023)   Exercise Vital Sign    Days of Exercise per Week: 4 days    Minutes of Exercise per Session: Patient declined  Stress: No Stress Concern Present (06/15/2023)   [employer] of Occupational Health - Occupational Stress Questionnaire    Feeling of Stress : Only a little  Social Connections: Unknown (06/15/2023)   Social Connection and Isolation Panel    Frequency of  Communication with Friends and Family: Three times a week    Frequency of Social Gatherings with Friends and Family: Once a week    Attends Religious Services: Not on Marketing executive or Organizations: Patient declined    Attends Banker Meetings: Patient declined    Marital Status: Married    MEDICATIONS:  Current Outpatient Medications  Medication Sig Dispense Refill   ACCU-CHEK GUIDE test strip USE TO TEST 1 TO 2 TIMES PER DAY 100 strip 3   Accu-Chek Softclix Lancets lancets USE TO TEST 1 TO 2 TIMES PER DAY 100 each 3   Adalimumab 40 MG/0.4ML PNKT Inject 1 each into the skin. 2 times a months.     albuterol (VENTOLIN HFA) 108 (90 Base) MCG/ACT inhaler INHALE 1 PUFF INTO THE LUNGS EVERY 6 HOURS AS NEEDED FOR WHEEZING OR SHORTNESS OF BREATH 18 g 1   atorvastatin (LIPITOR) 80 MG tablet TAKE 1 TABLET(80 MG) BY MOUTH DAILY 90 tablet 3   Blood Glucose Monitoring Suppl (ACCU-CHEK GUIDE ME) w/Device KIT Test 1-2 times daily. Pt needs accu-chek meter. Dx. Ell.9 1 kit 0   buPROPion (WELLBUTRIN XL) 150 MG 24 hr tablet TAKE 1 TABLET(150 MG) BY MOUTH DAILY 90 tablet 0   carvedilol (COREG) 25 MG tablet TAKE 1 TABLET(25 MG) BY MOUTH TWICE DAILY 180 tablet 3   cholecalciferol (VITAMIN D3) 25 MCG (1000 UNIT) tablet Take 1,000 Units by mouth daily.     Continuous Glucose Sensor (FREESTYLE LIBRE 3 PLUS SENSOR) MISC 1  each by Does not apply route continuous. Change every 15 days. 6 each 3   Continuous Glucose Sensor (FREESTYLE LIBRE 3 SENSOR) MISC by Does not apply route.     empagliflozin (JARDIANCE) 25 MG TABS tablet Take 1 tablet (25 mg total) by mouth daily. 90 tablet 3   ENTRESTO 24-26 MG TAKE 1 TABLET BY MOUTH TWICE DAILY 180 tablet 3   Fenofibrate 50 MG CAPS TAKE ONE CAPSULE BY MOUTH DAILY 90 capsule 2   HYDROcodone-acetaminophen (NORCO) 10-325 MG tablet Take 1 tablet by mouth every 6 (six) hours as needed.     insulin degludec (TRESIBA FLEXTOUCH) 100 UNIT/ML FlexTouch Pen Inject 20 Units into the skin daily. 15 mL 3   Insulin Pen Needle (PEN NEEDLES) 32G X 4 MM MISC 1 each by Other route daily. 100 each 2   metFORMIN (GLUCOPHAGE-XR) 500 MG 24 hr tablet Take 4 tablets (2,000 mg total) by mouth daily with breakfast. 360 tablet 3   montelukast (SINGULAIR) 10 MG tablet TAKE 1 TABLET(10 MG) BY MOUTH AT BEDTIME 90 tablet 1   Multiple Vitamin (MULTIVITAMIN WITH MINERALS) TABS tablet Take 1 tablet by mouth daily.     repaglinide (PRANDIN) 1 MG tablet Take 1 tablet (1 mg total) by mouth 3 (three) times daily before meals. 270 tablet 3   spironolactone (ALDACTONE) 25 MG tablet TAKE 1 TABLET(25 MG) BY MOUTH EVERY OTHER DAY 45 tablet 1   SYMBICORT 160-4.5 MCG/ACT inhaler INHALE 2 PUFFS INTO THE LUNGS TWICE DAILY 10.2 g 2   traZODone (DESYREL) 100 MG tablet TAKE ONE TABLET (100 MG TOTAL) BY MOUTH AT BEDTIME. 90 tablet 0   XARELTO 20 MG TABS tablet TAKE ONE BY MOUTH DAILY WITH DINNER 90 tablet 1   No current facility-administered medications for this visit.    PHYSICAL EXAM: Vitals:   05/02/24 0959  BP: 118/70  Pulse: 75  Resp: 20  SpO2: 97%  Weight: 183 lb 6.4 oz (  83.2 kg)  Height: 5' 8 (1.727 m)   Body mass index is 27.89 kg/m.  Wt Readings from Last 3 Encounters:  05/02/24 183 lb 6.4 oz (83.2 kg)  03/17/24 184 lb 6.4 oz (83.6 kg)  12/28/23 184 lb 3.2 oz (83.6 kg)    General: Well developed, well  nourished male in no apparent distress.  HEENT: AT/[place], no external lesions.  Eyes: Conjunctiva clear and no icterus. Neck: Neck supple  Lungs: Respirations not labored Neurologic: Alert, oriented, normal speech Extremities / Skin: Dry.  Psychiatric: Does not appear depressed or anxious  Diabetic Foot Exam - Simple   No data filed     LABS Reviewed Lab Results  Component Value Date   HGBA1C 7.0 (A) 05/02/2024   HGBA1C 6.6 (A) 12/28/2023   HGBA1C 6.4 (A) 08/24/2023   No results found for: FRUCTOSAMINE Lab Results  Component Value Date   CHOL 130 07/13/2023   HDL 59 07/13/2023   LDLCALC 58 07/13/2023   LDLDIRECT 51.0 06/02/2022   TRIG 62 07/13/2023   CHOLHDL 2.2 07/13/2023   Lab Results  Component Value Date   MICRALBCREAT 709 (H) 12/14/2023   MICRALBCREAT 10 02/11/2020   Lab Results  Component Value Date   CREATININE 1.02 12/14/2023   Lab Results  Component Value Date   GFR 64.87 05/18/2023    ASSESSMENT / PLAN  1. Type 2 diabetes mellitus with hyperglycemia, with long-term current use of insulin (HCC)     Diabetes Mellitus type 2, complicated by diabetic retinopathy /microalbuminuria. - Diabetic status / severity: Controlled.  Lab Results  Component Value Date   HGBA1C 7.0 (A) 05/02/2024    - Hemoglobin A1c goal : <6.5%  Discussed about limiting carbohydrate to avoid postprandial hyperglycemia.  - Medications: See below.  No change today.  I) continue Tresiba 18 units daily. II) continue Jardiance 25 mg daily. III) continue metformin extended release 2000 mg daily. IV) take Prandin 1 mg as needed with largest meal only.  Do not take in case of increased physical activity or work, to prevent hypoglycemia.  - Home glucose testing: CGM and check as needed.  - Discussed/ Gave Hypoglycemia treatment plan.  # Consult : not required at this time.   # Annual urine for microalbuminuria/ creatinine ratio, + microalbuminuria currently, continue ACE/ARB  /valsartan SGLT 2 inhibitor.  Will recheck today.  If still significantly elevated or not trending down will refer to nephrology. Last  Lab Results  Component Value Date   MICRALBCREAT 709 (H) 12/14/2023    # Foot check nightly.  # Patient has diabetic retinopathy, follow-up with ophthalmology.  - Diet: Make healthy diabetic food choices - Life style / activity / exercise: Discussed.  2. Blood pressure  -  BP Readings from Last 1 Encounters:  05/02/24 118/70    - Control is in target.  - No change in current plans.  3. Lipid status / Hyperlipidemia - Last  Lab Results  Component Value Date   LDLCALC 58 07/13/2023   - Continue atorvastatin 80 mg daily.  On fenofibrate.  Diagnoses and all orders for this visit:  Type 2 diabetes mellitus with hyperglycemia, with long-term current use of insulin (HCC) -     POCT glycosylated hemoglobin (Hb A1C) -     Microalbumin / creatinine urine ratio    DISPOSITION Follow up in clinic in 4 months suggested.  Labs on same day of visit.   All questions answered and patient verbalized understanding of the plan.  [place]  Mercie, MD [facility] [address] [place], [address] [place], [place] [address] Phone # [phone]  At least part of this note was generated using voice recognition software. Inadvertent word errors may have occurred, which were not recognized during the proofreading process.

## 2024-05-03 LAB — MICROALBUMIN / CREATININE URINE RATIO
Creatinine, Urine: 31 mg/dL (ref 20–320)
Microalb Creat Ratio: 200 mg/g{creat} — ABNORMAL HIGH (ref <30)
Microalb, Ur: 6.2 mg/dL

## 2024-05-06 ENCOUNTER — Other Ambulatory Visit: Payer: Self-pay | Admitting: Endocrinology

## 2024-05-06 DIAGNOSIS — E113393 Type 2 diabetes mellitus with moderate nonproliferative diabetic retinopathy without macular edema, bilateral: Secondary | ICD-10-CM

## 2024-05-11 ENCOUNTER — Other Ambulatory Visit: Payer: Self-pay | Admitting: Medical

## 2024-05-12 NOTE — Telephone Encounter (Signed)
 Patient is due for a follow-up on medication. Please schedule

## 2024-05-13 NOTE — Telephone Encounter (Signed)
 Medcheck scheduled with Ludie on 05/27/24

## 2024-05-26 ENCOUNTER — Other Ambulatory Visit: Payer: Self-pay | Admitting: Medical

## 2024-05-26 DIAGNOSIS — J4540 Moderate persistent asthma, uncomplicated: Secondary | ICD-10-CM

## 2024-05-27 ENCOUNTER — Encounter: Payer: Self-pay | Admitting: Medical

## 2024-05-27 ENCOUNTER — Ambulatory Visit: Admitting: Medical

## 2024-05-27 VITALS — BP 128/78 | HR 68 | Ht 68.0 in | Wt 183.6 lb

## 2024-05-27 DIAGNOSIS — R809 Proteinuria, unspecified: Secondary | ICD-10-CM

## 2024-05-27 DIAGNOSIS — Z794 Long term (current) use of insulin: Secondary | ICD-10-CM

## 2024-05-27 DIAGNOSIS — G479 Sleep disorder, unspecified: Secondary | ICD-10-CM | POA: Diagnosis not present

## 2024-05-27 DIAGNOSIS — F1011 Alcohol abuse, in remission: Secondary | ICD-10-CM

## 2024-05-27 DIAGNOSIS — E1165 Type 2 diabetes mellitus with hyperglycemia: Secondary | ICD-10-CM

## 2024-05-27 DIAGNOSIS — I10 Essential (primary) hypertension: Secondary | ICD-10-CM

## 2024-05-27 DIAGNOSIS — E782 Mixed hyperlipidemia: Secondary | ICD-10-CM

## 2024-05-27 DIAGNOSIS — Z23 Encounter for immunization: Secondary | ICD-10-CM | POA: Diagnosis not present

## 2024-05-27 DIAGNOSIS — K7030 Alcoholic cirrhosis of liver without ascites: Secondary | ICD-10-CM

## 2024-05-27 DIAGNOSIS — M069 Rheumatoid arthritis, unspecified: Secondary | ICD-10-CM

## 2024-05-27 DIAGNOSIS — I480 Paroxysmal atrial fibrillation: Secondary | ICD-10-CM

## 2024-05-27 NOTE — Progress Notes (Signed)
 Name: Theodore Bush   Date of Visit: 05/27/24   Date of last visit with me: 05/26/2024   CHIEF COMPLAINT:  Chief Complaint  Patient presents with   Diabetes    Nonfasting med check, will take flu shot and come back covid booster once we have them. No new concerns. DM EYE done [facility], a month ago-I will get.        HPI:  Discussed the use of AI scribe software for clinical note transcription with the patient, who gave verbal consent to proceed.  History of Present Illness    Theodore Bush is a 73 year old male with diabetes and hypertension who presents for medication management and sleep issues.  He monitors his blood sugar levels using a Libre device, noting fluctuations that remain within an acceptable range. His diabetes management includes Prandin , Jardiance , and metformin , with occasional additional pills for spikes. He has not yet discussed changes to his metformin  dose with his diabetes specialist.  Here for  His hypertension and heart conditions are managed with spironolactone , Entresto , and carvedilol . For cholesterol, he takes atorvastatin  and fenofibrate . He is unsure about the specific purposes of all his medications but acknowledges their use.  He received a flu shot during this visit and recalls having a shingles vaccine at [facility], though he is unsure about his tetanus vaccination status. He has stopped taking Wellbutrin , initially prescribed for alcohol cravings, due to adverse effects. Instead, he uses trazodone , breaking 100 mg tablets into quarters, to manage cravings and aid sleep. He drinks occasionally on weekends but has significantly reduced his alcohol intake.  He experiences sleep disturbances, waking every two hours to urinate, and takes trazodone  in small doses throughout the day to manage cravings. He inquires about trying Ambien for sleep. He acknowledges habits that may disrupt sleep, such as napping during the day and using electronic devices  before bed.  He reports regular hydration and attempts to limit coffee intake. He recalls an ultrasound of his abdomen last year and mentions a recommendation for an endoscopy that has not yet been scheduled. He had an eye exam about a month ago at [facility] and does not need another for a year.  No other aggravating or relieving factors. No other complaint.   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   Asthma    Atrial fibrillation (HCC) 09/02/2018   CHF (congestive heart failure) (HCC)    Chronic kidney disease 06/11/2019   Cirrhosis (HCC)    Controlled diabetes mellitus type 2 with complications (HCC) 09/02/2018   Controlled type 2 diabetes mellitus with complication, with long-term current use of insulin  (HCC) 09/02/2018   Coronary artery calcification 06/11/2019   Hyperlipidemia    Hypertension    Insomnia 06/11/2019   Pneumonia 2019   Sleep apnea    he was 280lb at the time   Staph infection    to elbow 15-87yrs ago   Type 2 diabetes mellitus with hyperglycemia, without long-term current use of insulin  (HCC) 02/03/2022   Current Outpatient Medications on File Prior to Visit  Medication Sig Dispense Refill   ACCU-CHEK GUIDE test strip USE TO TEST 1 TO 2 TIMES PER DAY 100 strip 3   Accu-Chek Softclix Lancets lancets USE TO TEST 1 TO 2 TIMES PER DAY 100 each 3   Adalimumab 40 MG/0.4ML PNKT Inject 1 each into the skin. 2 times a months.     albuterol  (VENTOLIN  HFA) 108 (90  Base) MCG/ACT inhaler INHALE 1 PUFF INTO THE LUNGS EVERY 6 HOURS AS NEEDED FOR WHEEZING OR SHORTNESS OF BREATH 18 g 1   atorvastatin  (LIPITOR) 80 MG tablet TAKE 1 TABLET(80 MG) BY MOUTH DAILY 90 tablet 3   Blood Glucose Monitoring Suppl (ACCU-CHEK GUIDE ME) w/Device KIT Test 1-2 times daily. Pt needs accu-chek meter. Dx. Ell.9 1 kit 0   carvedilol  (COREG ) 25 MG tablet TAKE 1 TABLET(25 MG) BY MOUTH TWICE DAILY 180 tablet 3   Continuous Glucose Sensor (FREESTYLE  LIBRE 3 PLUS SENSOR) MISC 1 each by Does not apply route continuous. Change every 15 days. 6 each 3   Continuous Glucose Sensor (FREESTYLE LIBRE 3 SENSOR) MISC by Does not apply route.     ENTRESTO  24-26 MG TAKE 1 TABLET BY MOUTH TWICE DAILY 180 tablet 3   Fenofibrate  50 MG CAPS TAKE ONE CAPSULE BY MOUTH DAILY 90 capsule 2   HYDROcodone-acetaminophen (NORCO) 10-325 MG tablet Take 1 tablet by mouth every 6 (six) hours as needed.     insulin  degludec (TRESIBA  FLEXTOUCH) 100 UNIT/ML FlexTouch Pen Inject 20 Units into the skin daily. 15 mL 3   Insulin  Pen Needle (PEN NEEDLES) 32G X 4 MM MISC 1 each by Other route daily. 100 each 2   JARDIANCE  25 MG TABS tablet TAKE 1 TABLET(25 MG) BY MOUTH DAILY 90 tablet 3   metFORMIN  (GLUCOPHAGE -XR) 500 MG 24 hr tablet Take 4 tablets (2,000 mg total) by mouth daily with breakfast. 360 tablet 3   montelukast  (SINGULAIR ) 10 MG tablet TAKE 1 TABLET(10 MG) BY MOUTH AT BEDTIME 90 tablet 1   Multiple Vitamin (MULTIVITAMIN WITH MINERALS) TABS tablet Take 1 tablet by mouth daily.     repaglinide  (PRANDIN ) 1 MG tablet Take 1 tablet (1 mg total) by mouth 3 (three) times daily before meals. 270 tablet 3   spironolactone  (ALDACTONE ) 25 MG tablet TAKE 1 TABLET(25 MG) BY MOUTH EVERY OTHER DAY 45 tablet 1   SYMBICORT  160-4.5 MCG/ACT inhaler INHALE 2 PUFFS INTO THE LUNGS TWICE DAILY 10.2 g 2   traZODone  (DESYREL ) 100 MG tablet TAKE ONE TABLET (100 MG TOTAL) BY MOUTH AT BEDTIME. 90 tablet 0   XARELTO  20 MG TABS tablet TAKE ONE BY MOUTH DAILY WITH DINNER 90 tablet 1   No current facility-administered medications on file prior to visit.   ROS as in subjective    Objective BP 128/78   Pulse 68   Ht 5' 8 (1.727 m)   Wt 183 lb 9.6 oz (83.3 kg)   BMI 27.92 kg/m   General appearence: alert, no distress, WD/WN,  Neck: supple, no lymphadenopathy, no thyromegaly, no masses Heart: occasoinal irregular beat, RRR, normal S1, S2, no murmurs Lungs: CTA bilaterally, no wheezes,  rhonchi, or rales Pulses: 2+ symmetric, upper and lower extremities, normal cap refill    Assessment: Encounter Diagnoses  Name Primary?   Sleep disturbance Yes   Microalbuminuria    Need for influenza vaccination    Paroxysmal atrial fibrillation (HCC)    Type 2 diabetes mellitus with hyperglycemia, with long-term current use of insulin  (HCC)    Rheumatoid arthritis, involving unspecified site, unspecified whether rheumatoid factor present (HCC)    Mixed dyslipidemia    Primary hypertension    Alcoholic cirrhosis of liver without ascites (HCC)    History of alcohol abuse     Plan: Sleep disturbance -Counseled on sleep hygiene -He will change trazodone  from 100 mg, 1/4 tablet 3 times a day to 1/4 tablet morning and  lunch but doing 50 mg or half a tablet.  If that does not seem to be helping with sleep we can try Belsomra  Counseled on the influenza virus vaccine.  Vaccine information sheet given.  Influenza vaccine given after consent obtained.  Microalbuminuria - I will request endocrinology cut back his metformin  a little bit to help prevent progression to worse kidney function - He is on Entresto  and Jardiance  for heart and diabetes and for kidney protection  History of alcohol abuse, cirrhosis -It appears that he was recommended to have endoscopy last year by GI.  He has not had this test done.  I will reach back out to gastro about whether they want to get him back in for an appointment -Thankfully he has cut back on alcohol and just drinks on the weekends compared to half a pint of vodka daily -He did not tolerate Wellbutrin  but he seems to be finding benefit with trazodone  100 mg, 1/4 tablet 3 times daily  Paroxysmal atrial fibrillation patient, history of heart failure -Continue carvedilol  25 mg twice daily - Continue Entresto  24/26 mg twice daily - Continue spironolactone  25 mg every other day -Continue Xarelto  anticoagulant  Diabetes on metformin  - Currently on  metformin , Jardiance , Prandin , and Tresiba  -Request nephrology cut back on metformin  due to microalbumin and kidney function  Rheumatoid arthritis-managed by rheumatology  Dyslipidemia - Continue fenofibrate  50 mg daily, continue atorvastatin  Lipitor 80 mg daily -Will be due for cholesterol labs soon.  He is not fasting today  History of alcoholic cirrhosis-limit or stop alcohol.  Fortunately he has cut back since last visit here  Alter Moss was seen today for diabetes.  Diagnoses and all orders for this visit:  Sleep disturbance  Microalbuminuria  Need for influenza vaccination -     Flu vaccine HIGH DOSE PF(Fluzone Trivalent)  Paroxysmal atrial fibrillation (HCC)  Type 2 diabetes mellitus with hyperglycemia, with long-term current use of insulin  (HCC)  Rheumatoid arthritis, involving unspecified site, unspecified whether rheumatoid factor present (HCC)  Mixed dyslipidemia  Primary hypertension  Alcoholic cirrhosis of liver without ascites (HCC)  History of alcohol abuse    F/u yearly for well visit

## 2024-05-28 ENCOUNTER — Telehealth: Payer: Self-pay

## 2024-05-28 NOTE — Progress Notes (Signed)
 Left detailed message for pt to continue metformin  for now

## 2024-05-28 NOTE — Telephone Encounter (Signed)
 Patient aware that he is scheduled to follow up with Deanna May, NP on 06-10-24 at 3:20pm to discuss EGD.  Patient agreed to plan and verbalized understanding.  No further questions or concerns.

## 2024-05-28 NOTE — Telephone Encounter (Signed)
-----   Message from Sandor LULLA Flatter sent at 05/28/2024  1:08 PM EDT ----- Thanks for reaching out.  Yes we tried getting him in for EGD last year for variceal screening.  Will reach out to him and schedule follow-up with us  scheduled EGD at that time.    Nat, please schedule follow-up appointment with me or one of the pod B APPs.  Thanks. ----- Message ----- From: Bulah Alm GORMAN DEVONNA Sent: 05/27/2024   2:17 PM EDT To: Sandor Flatter LULLA, DO  Hello Dr. Flatter  I was looking back through his chart and saw that he has not had an EGD.  I think he was planned to do this last year.  Do want to have your office contact him for updated EGD given history of alcohol abuse and cirrhosis. It seems as if he has cut back on alcohol recently. Thanks [employer]

## 2024-06-02 ENCOUNTER — Ambulatory Visit: Payer: Self-pay | Admitting: Medical

## 2024-06-02 LAB — HM DIABETES EYE EXAM

## 2024-06-02 NOTE — Progress Notes (Signed)
 abstract

## 2024-06-05 ENCOUNTER — Ambulatory Visit: Payer: Self-pay | Admitting: Medical

## 2024-06-05 NOTE — Progress Notes (Signed)
 Abstract

## 2024-06-06 ENCOUNTER — Encounter: Attending: Endocrinology | Admitting: Dietician

## 2024-06-06 ENCOUNTER — Encounter: Payer: Self-pay | Admitting: Dietician

## 2024-06-06 VITALS — Wt 181.0 lb

## 2024-06-06 DIAGNOSIS — Z794 Long term (current) use of insulin: Secondary | ICD-10-CM | POA: Insufficient documentation

## 2024-06-06 DIAGNOSIS — E1165 Type 2 diabetes mellitus with hyperglycemia: Secondary | ICD-10-CM | POA: Diagnosis present

## 2024-06-06 NOTE — Patient Instructions (Addendum)
 Patient Instructions  [place] job on changes made! Continue to watch your CGM for effects of what you eat on your blood glucose. Continue to stay active! Increase your vegetable intake (1/2 plate) Whole grains most often Lean protein, less red meat. Continue Mindfulness with beer.  Great job on stopping the Vodka!

## 2024-06-06 NOTE — Progress Notes (Signed)
 Diabetes Self-Management Education  Visit Type: Follow-up  Appt. Start Time: 0905 Appt. End Time: 9062  06/06/2024  [name], identified by name and date of birth, is a 73 y.o. male with a diagnosis of Diabetes:  .   ASSESSMENT Patient is here today alone.  He was last seen by this RD on 11/30/2023. He is concerned as he is having problems keeping his blood glucose down.  History includes:  Type 2 diabetes (1995 and insulin  since 2018), OSA (improved after weight loss - no C-pap), HLD, HTN, CKD, CHF, Cirrhosis, asthma, A-fib Medications include:  Jardiance , Metformin  XR, Tresiba  18-20 units q am, trial of prandin  with lunch and occasionally with dinner, spironolactone , MVI, trazadone (helps him not drink and sleep) Labs noted to include:  A1C 7% 05/02/2024 increased from GFR 64  on 05/18/2023, Vitamin B-12 321 06/11/2019 and 682 on 12/22/2022. CGM:  FreeStyle Libre 3  Fasting sensor reading 145 today (usual is <145) and 134 now after coffee but no breakfast    CGM Results from download: 03/02/2023 06/08/2023 11/30/23 96/19/25  % Time CGM active:   96 %   (Goal >70%) 96% 95 96  Average glucose:   188 mg/dL for 14 days 858 X 14 days 138 x14 142x14  Glucose management indicator:   7.8 % 6.7% 6.6% 6.7%  Time in range (70-180 mg/dL):   48 %   (Goal >29%) 77% 82 83  Time High (181-250 mg/dL):   37 %   (Goal < 74%) 20 16 16   Time Very High (>250 mg/dL):    15 %   (Goal < 5%) 1 1 0  Time Low (54-69 mg/dL):   0 %   (Goal <5%) 2 1 1   Time Very Low (<54 mg/dL):   0 %   (Goal <8%) 0 0 0    Weight hx: 70 181 lbs 06/06/2024 (trying to be more mindful due to increased blood glucose) 190 lbs 06/08/2023  (wanted to regain) and 11/30/2023 174 lbs 03/02/2023 182 lbs 02/16/2023 175 lbs 165 lbs (lost on GLP-1 drugs but stopped as he did not tolerate it) - did not feel well at this weight. Lost muscle 280 lbs highest adult weight Goal weight per patient about 180 lbs want to gain muscle   Patient  lives with his wife.  She does most of the shopping and cooking.  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  Gets 10,000 steps per day on work days.  Now working less but stays active.  Has weights but does not use them.  Mows his own lawn.  Has lived locally for 3 years to be near family. Stopped vodka except for occasionally and drinks 2 beers per day.  He states his family and wife have noticed.  Weight 181 lb (82.1 kg). Body mass index is 27.52 kg/m.   Diabetes Self-Management Education - 06/06/24 0948       Visit Information   Visit Type Follow-up      Health Coping   How would you rate your overall health? Good      Psychosocial Assessment   Patient Belief/Attitude about Diabetes Motivated to manage diabetes    What is the hardest part about your diabetes right now, causing you the most concern, or is the most worrisome to you about your diabetes?   Making healty food and beverage choices    Self-care barriers None    Self-management support Doctor's office;CDE visits    Other persons  present Patient    Patient Concerns Nutrition/Meal planning;Glycemic Control;Healthy Lifestyle    Special Needs None    Preferred Learning Style No preference indicated    Learning Readiness Ready      Pre-Education Assessment   Patient understands the diabetes disease and treatment process. Demonstrates understanding / competency    Patient understands incorporating nutritional management into lifestyle. Needs Review    Patient undertands incorporating physical activity into lifestyle. Comprehends key points    Patient understands using medications safely. Comprehends key points    Patient understands monitoring blood glucose, interpreting and using results Needs Review    Patient understands prevention, detection, and treatment of acute complications. Needs Review    Patient understands prevention, detection, and treatment of chronic complications. Compreheands key points    Patient  understands how to develop strategies to address psychosocial issues. Comprehends key points    Patient understands how to develop strategies to promote health/change behavior. Needs Review      Complications   Last HgB A1C per patient/outside source 7 %   05/02/2024   How often do you check your blood sugar? > 4 times/day    Fasting Blood glucose range (mg/dL) 29-870;869-820   <854   Postprandial Blood glucose range (mg/dL) 869-820;819-799;>799    Number of hypoglycemic episodes per month 1    Number of hyperglycemic episodes ( >200mg /dL): Daily    Can you tell when your blood sugar is high? Yes    What do you do if your blood sugar is high? drinks water      Dietary Intake   Breakfast Small pancakes with PB, fruit OR whole grain cereal, whole milk    Lunch RB, salami, cheese sub (small)    Dinner Homemade chili    Beverage(s) Black coffee, water, flavored water, 2 cups whole milk per day, stopped vodka except occasionally, 2 beers per day      Activity / Exercise   Activity / Exercise Type Light (walking / raking leaves)    How many days per week do you exercise? 7    How many minutes per day do you exercise? 30    Total minutes per week of exercise 210      Patient Education   Previous Diabetes Education Yes   11/2023   Disease Pathophysiology Definition of diabetes, type 1 and 2, and the diagnosis of diabetes   insulin  resistance vs insulin  deficincy   Healthy Eating Role of diet in the treatment of diabetes and the relationship between the three main macronutrients and blood glucose level;Meal options for control of blood glucose level and chronic complications.;Reviewed blood glucose goals for pre and post meals and how to evaluate the patients' food intake on their blood glucose level.    Being Active Role of exercise on diabetes management, blood pressure control and cardiac health.    Medications Reviewed patients medication for diabetes, action, purpose, timing of dose and side  effects.    Monitoring Taught/evaluated CGM (comment)    Chronic complications Identified and discussed with patient  current chronic complications    Diabetes Stress and Support Identified and addressed patients feelings and concerns about diabetes;Worked with patient to identify barriers to care and solutions      Individualized Goals (developed by patient)   Nutrition General guidelines for healthy choices and portions discussed    Physical Activity Exercise 5-7 days per week;30 minutes per day    Medications take my medication as prescribed    Monitoring  Consistenly  use CGM    Problem Solving Medication consistency;Sleep Pattern;Addressing barriers to behavior change    Reducing Risk examine blood glucose patterns;do foot checks daily;treat hypoglycemia with 15 grams of carbs if blood glucose less than 70mg /dL      Patient Self-Evaluation of Goals - Patient rates self as meeting previously set goals (% of time)   Nutrition >75% (most of the time)    Physical Activity >75% (most of the time)    Medications >75% (most of the time)    Monitoring >75% (most of the time)    Problem Solving and behavior change strategies  >75% (most of the time)    Reducing Risk (treating acute and chronic complications) >75% (most of the time)    Health Coping >75% (most of the time)      Post-Education Assessment   Patient understands the diabetes disease and treatment process. Demonstrates understanding / competency    Patient understands incorporating nutritional management into lifestyle. Comprehends key points    Patient undertands incorporating physical activity into lifestyle. Demonstrates understanding / competency    Patient understands using medications safely. Demonstrates understanding / competency    Patient understands monitoring blood glucose, interpreting and using results Demonstrates understanding / competency    Patient understands prevention, detection, and treatment of acute  complications. Demonstrates understanding / competency    Patient understands prevention, detection, and treatment of chronic complications. Demonstrates understanding / competency    Patient understands how to develop strategies to address psychosocial issues. Demonstrates understanding / competency    Patient understands how to develop strategies to promote health/change behavior. Comprehends key points      Outcomes   Expected Outcomes Demonstrated interest in learning. Expect positive outcomes    Future DMSE 6 months    Program Status Not Completed      Subsequent Visit   Since your last visit have you continued or begun to take your medications as prescribed? Yes    Since your last visit have you experienced any weight changes? Loss    Weight Loss (lbs) 9    Since your last visit, are you checking your blood glucose at least once a day? Yes          Individualized Plan for Diabetes Self-Management Training:   Learning Objective:  Patient will have a greater understanding of diabetes self-management. Patient education plan is to attend individual and/or group sessions per assessed needs and concerns.   Plan:   Patient Instructions   Patient Instructions  [place] job on changes made! Continue to watch your CGM for effects of what you eat on your blood glucose. Continue to stay active! Increase your vegetable intake (1/2 plate) Whole grains most often Lean protein, less red meat. Continue Mindfulness with beer.  Great job on stopping the Vodka!  Expected Outcomes:  Demonstrated interest in learning. Expect positive outcomes  Education material provided:   If problems or questions, patient to contact team via:  Phone  Future DSME appointment: 6 months

## 2024-06-07 ENCOUNTER — Other Ambulatory Visit: Payer: Self-pay | Admitting: Endocrinology

## 2024-06-07 DIAGNOSIS — Z794 Long term (current) use of insulin: Secondary | ICD-10-CM

## 2024-06-09 ENCOUNTER — Other Ambulatory Visit: Payer: Self-pay | Admitting: Endocrinology

## 2024-06-09 DIAGNOSIS — E1165 Type 2 diabetes mellitus with hyperglycemia: Secondary | ICD-10-CM

## 2024-06-10 ENCOUNTER — Other Ambulatory Visit (INDEPENDENT_AMBULATORY_CARE_PROVIDER_SITE_OTHER)

## 2024-06-10 ENCOUNTER — Encounter: Payer: Self-pay | Admitting: Gastroenterology

## 2024-06-10 ENCOUNTER — Ambulatory Visit (INDEPENDENT_AMBULATORY_CARE_PROVIDER_SITE_OTHER): Admitting: Gastroenterology

## 2024-06-10 VITALS — BP 120/68 | HR 73 | Ht 68.0 in | Wt 184.2 lb

## 2024-06-10 DIAGNOSIS — Z860100 Personal history of colon polyps, unspecified: Secondary | ICD-10-CM

## 2024-06-10 DIAGNOSIS — K7460 Unspecified cirrhosis of liver: Secondary | ICD-10-CM

## 2024-06-10 DIAGNOSIS — Z860101 Personal history of adenomatous and serrated colon polyps: Secondary | ICD-10-CM

## 2024-06-10 DIAGNOSIS — Z7901 Long term (current) use of anticoagulants: Secondary | ICD-10-CM | POA: Diagnosis not present

## 2024-06-10 DIAGNOSIS — F1090 Alcohol use, unspecified, uncomplicated: Secondary | ICD-10-CM

## 2024-06-10 DIAGNOSIS — I480 Paroxysmal atrial fibrillation: Secondary | ICD-10-CM

## 2024-06-10 LAB — COMPREHENSIVE METABOLIC PANEL WITH GFR
ALT: 22 U/L (ref 0–53)
AST: 27 U/L (ref 0–37)
Albumin: 4.4 g/dL (ref 3.5–5.2)
Alkaline Phosphatase: 25 U/L — ABNORMAL LOW (ref 39–117)
BUN: 28 mg/dL — ABNORMAL HIGH (ref 6–23)
CO2: 36 meq/L — ABNORMAL HIGH (ref 19–32)
Calcium: 9.7 mg/dL (ref 8.4–10.5)
Chloride: 95 meq/L — ABNORMAL LOW (ref 96–112)
Creatinine, Ser: 1.1 mg/dL (ref 0.40–1.50)
GFR: 67.2 mL/min (ref >60.00)
Glucose, Bld: 171 mg/dL — ABNORMAL HIGH (ref 70–99)
Potassium: 4.1 meq/L (ref 3.5–5.1)
Sodium: 137 meq/L (ref 135–145)
Total Bilirubin: 0.7 mg/dL (ref 0.2–1.2)
Total Protein: 7.1 g/dL (ref 6.0–8.3)

## 2024-06-10 LAB — CBC WITH DIFFERENTIAL/PLATELET
Basophils Absolute: 0 K/uL (ref 0.0–0.1)
Basophils Relative: 0.3 % (ref 0.0–3.0)
Eosinophils Absolute: 0.2 K/uL (ref 0.0–0.7)
Eosinophils Relative: 4.4 % (ref 0.0–5.0)
HCT: 44.1 % (ref 39.0–52.0)
Hemoglobin: 14.9 g/dL (ref 13.0–17.0)
Lymphocytes Relative: 40.2 % (ref 12.0–46.0)
Lymphs Abs: 2.2 K/uL (ref 0.7–4.0)
MCHC: 33.7 g/dL (ref 30.0–36.0)
MCV: 90.3 fl (ref 78.0–100.0)
Monocytes Absolute: 0.4 K/uL (ref 0.1–1.0)
Monocytes Relative: 8 % (ref 3.0–12.0)
Neutro Abs: 2.6 K/uL (ref 1.4–7.7)
Neutrophils Relative %: 47.1 % (ref 43.0–77.0)
Platelets: 176 K/uL (ref 150.0–400.0)
RBC: 4.89 Mil/uL (ref 4.22–5.81)
RDW: 13.6 % (ref 11.5–15.5)
WBC: 5.6 K/uL (ref 4.0–10.5)

## 2024-06-10 LAB — PROTIME-INR
INR: 1.9 ratio — ABNORMAL HIGH (ref 0.8–1.0)
Prothrombin Time: 19.7 s — ABNORMAL HIGH (ref 9.6–13.1)

## 2024-06-10 NOTE — Patient Instructions (Signed)
 Your provider has requested that you go to the basement level for lab work before leaving today. Press B on the elevator. The lab is located at the first door on the left as you exit the elevator.  You have been scheduled for an abdominal ultrasound at [facility] (1st floor of hospital) on 06/16/24 at 8:30am. Please arrive 30 minutes prior to your appointment for registration. Make certain not to have anything to eat or drink 6 hours prior to your appointment. Should you need to reschedule your appointment, please contact radiology at [phone]. This test typically takes about 30 minutes to perform.  _______________________________________________________  If your blood pressure at your visit was 140/90 or greater, please contact your primary care physician to follow up on this.  _______________________________________________________  If you are age 72 or older, your body mass index should be between 23-30. Your Body mass index is 28.02 kg/m. If this is out of the aforementioned range listed, please consider follow up with your Primary Care Provider.  If you are age 97 or younger, your body mass index should be between 19-25. Your Body mass index is 28.02 kg/m. If this is out of the aformentioned range listed, please consider follow up with your Primary Care Provider.   ________________________________________________________  The [facility] providers would like to encourage you to use MYCHART to communicate with providers for non-urgent requests or questions.  Due to long hold times on the telephone, sending your provider a message by [facility] may be a faster and more efficient way to get a response.  Please allow 48 business hours for a response.  Please remember that this is for non-urgent requests.  _______________________________________________________  [facility] is using a team-based approach to care.  Your team is made up of your doctor and two to three  APPS. Our APPS (Nurse Practitioners and Physician Assistants) work with your physician to ensure care continuity for you. They are fully qualified to address your health concerns and develop a treatment plan. They communicate directly with your gastroenterologist to care for you. Seeing the Advanced Practice Practitioners on your physician's team can help you by facilitating care more promptly, often allowing for earlier appointments, access to diagnostic testing, procedures, and other specialty referrals.   Thank you for trusting me with your gastrointestinal care. Deanna May, FNP-C

## 2024-06-10 NOTE — Progress Notes (Signed)
 Chief Complaint:follow-up liver disease Primary GI Doctor: Dr. San  HPI:  Patient is a  73  year old male patient with past medical history of history of A. fib (on Xarelto ), CHF (EF 35-40%), HTN, OSA, HLD, diabetes, CKD 3, asthma, Psoriatic Arthritis/Psoriasis (Hum[place]) presenting to the Gastroenterology Clinic for follow-up.  Last seen by Dr. San in GI office on 03/16/23 for follow-up.    Does have a history of heavy EtOH intake 6 drinks nightly (vodka) for many years.    - 01/20/2022: CT angio chest with incidentally noted fatty infiltration of the liver with possible calcified granuloma, mild nodularity and splenomegaly. - 02/09/2023: CT angio chest again showing nodular liver contour, calcified granulomas in the liver, and mild splenomegaly.  Partially visualized small volume ascites in the upper abdomen.   - 05/2022: INR 1.7 (on Xarelto ), normal CBC including PLT 185.  Normal CMP.  Normal AFP - 02/16/2023: Normal liver enzymes including albumin 4.5, T. bili 1.1.   --03/30/23 US  elastography:There was calcification of the right lobe of the liver, likely sequela of prior granulomatous disease.  Otherwise, no suspicious hepatic lesions identified.  Mildly nodular liver contour but otherwise normal parenchymal echogenicity.  Patent portal vein with normal flow.  Despite the mildly nodular liver contour, elastography portion shows a medium kPa of 3.6 which is normal and not indicative of advanced chronic liver disease.    --CBC-normal, PI/INR 18.8/1.8, most likely elevated due to chronic anticoagulation   Follows in the Cardiology Clinic, Endocrinology Clinic, Nutrition Clinic, and with Dr. Shyrl in CT surgery with continued surveillance of ascending thoracic aortic aneurysm.  Endoscopic History: - Colonoscopy (03/2020):9 polyps ranging 3-6 mm (path tubular adenomas:), 8 mm pedunculated rectosigmoid polyp (path: Tubular adenoma), 8 mm distal rectal polyp (path: Leiomyoma),  Diverticulosis, internal hemorrhoids.  Normal TI.  Repeat in 1 year  - Colonoscopy (12/2021): 2 small 2-4 mm cecal adenomas, sigmoid diverticulosis, internal hemorrhoids.  Recommended repeat colonoscopy in 5 years  03/17/24 cardiology note, reviewed entire note   Interval History   Patient presents for follow-up on cirrhosis.  Overall patient states he has been doing very well with no current complaints. Patient reports he has cut down on alcohol consumption but still has a few beers form time to time. He stopped the vodka completely. We discussed proceeding with endoscopy as recommended by Dr. San. He reports he will be traveling for the next few months and will schedule when he returns.   No abdominal pain, altered bowel habits, or blood in stool. Patient denies nausea or vomiting or unexplained weight loss. Patient denies any issues with urination or clay colored stools. Patient denies any altered mental status or moments of confusion. Patient denies any lower extremity edema or abdominal swelling.  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   Asthma    Atrial fibrillation (HCC) 09/02/2018   CHF (congestive heart failure) (HCC)    Chronic kidney disease 06/11/2019   Cirrhosis (HCC)    Controlled diabetes mellitus type 2 with complications (HCC) 09/02/2018   Controlled type 2 diabetes mellitus with complication, with long-term current use of insulin  (HCC) 09/02/2018   Coronary artery calcification 06/11/2019   Hyperlipidemia    Hypertension    Insomnia 06/11/2019   Pneumonia 2019   Sleep apnea  he was 280lb at the time   Staph infection    to elbow 15-47yrs ago   Type 2 diabetes mellitus with hyperglycemia, without long-term current use of insulin  (HCC) 02/03/2022    Past Surgical History:  Procedure  Laterality Date   COLONOSCOPY  12/2021   PALATE / UVULA BIOPSY / EXCISION     trimmed several times    Current Outpatient Medications  Medication Sig Dispense Refill   ACCU-CHEK GUIDE test strip USE TO TEST 1 TO 2 TIMES PER DAY 100 strip 3   Accu-Chek Softclix Lancets lancets USE TO TEST 1 TO 2 TIMES PER DAY 100 each 3   Adalimumab 40 MG/0.4ML PNKT Inject 1 each into the skin. 2 times a months.     albuterol  (VENTOLIN  HFA) 108 (90 Base) MCG/ACT inhaler INHALE 1 PUFF INTO THE LUNGS EVERY 6 HOURS AS NEEDED FOR WHEEZING OR SHORTNESS OF BREATH 18 g 1   atorvastatin  (LIPITOR) 80 MG tablet TAKE 1 TABLET(80 MG) BY MOUTH DAILY 90 tablet 3   Blood Glucose Monitoring Suppl (ACCU-CHEK GUIDE ME) w/Device KIT Test 1-2 times daily. Pt needs accu-chek meter. Dx. Ell.9 1 kit 0   carvedilol  (COREG ) 25 MG tablet TAKE 1 TABLET(25 MG) BY MOUTH TWICE DAILY 180 tablet 3   Continuous Glucose Sensor (FREESTYLE LIBRE 3 PLUS SENSOR) MISC 1 each by Does not apply route continuous. Change every 15 days. 6 each 3   Continuous Glucose Sensor (FREESTYLE LIBRE 3 SENSOR) MISC by Does not apply route.     ENTRESTO  24-26 MG TAKE 1 TABLET BY MOUTH TWICE DAILY 180 tablet 3   Fenofibrate  50 MG CAPS TAKE ONE CAPSULE BY MOUTH DAILY 90 capsule 2   HYDROcodone-acetaminophen (NORCO) 10-325 MG tablet Take 1 tablet by mouth every 6 (six) hours as needed.     insulin  glargine (LANTUS ) 100 UNIT/ML injection Inject 100 Units into the skin daily.     Insulin  Pen Needle (PEN NEEDLES) 32G X 4 MM MISC 1 each by Other route daily. 100 each 2   JARDIANCE  25 MG TABS tablet TAKE 1 TABLET(25 MG) BY MOUTH DAILY 90 tablet 3   metFORMIN  (GLUCOPHAGE -XR) 500 MG 24 hr tablet Take 4 tablets (2,000 mg total) by mouth daily with breakfast. 360 tablet 3   montelukast  (SINGULAIR ) 10 MG tablet TAKE 1 TABLET(10 MG) BY MOUTH AT BEDTIME 90 tablet 1   Multiple Vitamin (MULTIVITAMIN WITH MINERALS) TABS tablet Take 1 tablet by mouth daily.     repaglinide   (PRANDIN ) 1 MG tablet Take 1 tablet (1 mg total) by mouth 3 (three) times daily before meals. 270 tablet 3   spironolactone  (ALDACTONE ) 25 MG tablet TAKE 1 TABLET(25 MG) BY MOUTH EVERY OTHER DAY 45 tablet 1   SYMBICORT  160-4.5 MCG/ACT inhaler INHALE 2 PUFFS INTO THE LUNGS TWICE DAILY 10.2 g 2   traZODone  (DESYREL ) 100 MG tablet TAKE ONE TABLET (100 MG TOTAL) BY MOUTH AT BEDTIME. 90 tablet 0   TRESIBA  FLEXTOUCH 100 UNIT/ML FlexTouch Pen ADMINISTER 20 UNITS UNDER THE SKIN DAILY 15 mL 3   XARELTO  20 MG TABS tablet TAKE ONE BY MOUTH DAILY WITH DINNER 90 tablet 1   No current facility-administered medications for this visit.    Allergies as of 06/10/2024 - Review Complete 06/10/2024  Allergen Reaction Noted   Wellbutrin  [bupropion ]  05/27/2024   Quinolones Other (See Comments) 07/07/2019    Family History  Problem Relation Age of Onset   Lupus Mother    Hypertension Mother  Diabetes Father    Diabetes Paternal Grandmother    Colon cancer Neg Hx    Stomach cancer Neg Hx    Esophageal cancer Neg Hx    Rectal cancer Neg Hx     Review of Systems:    Constitutional: No weight loss, fever, chills, weakness or fatigue HEENT: Eyes: No change in vision               Ears, Nose, Throat:  No change in hearing or congestion Skin: No rash or itching Cardiovascular: No chest pain, chest pressure or palpitations   Respiratory: No SOB or cough Gastrointestinal: See HPI and otherwise negative Genitourinary: No dysuria or change in urinary frequency Neurological: No headache, dizziness or syncope Musculoskeletal: No new muscle or joint pain Hematologic: No bleeding or bruising Psychiatric: No history of depression or anxiety    Physical Exam:  Vital signs: BP 120/68 (BP Location: Left Arm, Patient Position: Sitting, Cuff Size: Normal)   Pulse 73   Ht 5' 8 (1.727 m)   Wt 184 lb 4 oz (83.6 kg)   BMI 28.02 kg/m   Constitutional:   Pleasant male appears to be in NAD, Well developed, Well  nourished, alert and cooperative Eyes:   PEERL, EOMI. No icterus. Conjunctiva pink. Throat: Oral cavity and pharynx without inflammation, swelling or lesion.  Respiratory: Respirations even and unlabored. Lungs clear to auscultation bilaterally.   No wheezes, crackles, or rhonchi.  Cardiovascular: Normal S1, S2. Regular rate and rhythm. No peripheral edema, cyanosis or pallor.  Gastrointestinal:  Soft, nondistended, nontender. No rebound or guarding. Normal bowel sounds. Rectal:  Not performed.  Msk:  Symmetrical without gross deformities. Without edema, no deformity or joint abnormality.  Neurologic:  Alert and  oriented x4;  grossly normal neurologically. No asterixis.  Skin:   Dry and intact without significant lesions or rashes.  RELEVANT LABS AND IMAGING: CBC    Latest Ref Rng & Units 12/14/2023   11:00 AM 07/13/2023   10:38 AM 03/16/2023    1:56 PM  CBC  WBC 3.4 - 10.8 x10E3/uL 5.2  4.1  5.2   Hemoglobin 13.0 - 17.7 g/dL 84.5  84.4  85.7   Hematocrit 37.5 - 51.0 % 45.0  45.4  42.2   Platelets 150 - 450 x10E3/uL 163  157  233.0      CMP     Latest Ref Rng & Units 12/14/2023   11:00 AM 07/13/2023   10:38 AM 05/18/2023    9:46 AM  CMP  Glucose 70 - 99 mg/dL 879  885  856   BUN 8 - 27 mg/dL 22  24  23    Creatinine 0.76 - 1.27 mg/dL 8.97  8.92  8.85   Sodium 134 - 144 mmol/L 139  141  140   Potassium 3.5 - 5.2 mmol/L 4.4  5.0  4.3   Chloride 96 - 106 mmol/L 99  99  100   CO2 20 - 29 mmol/L 26  29  33   Calcium  8.6 - 10.2 mg/dL 9.4  9.8  9.4   Total Protein 6.0 - 8.5 g/dL 7.1  7.1    Total Bilirubin 0.0 - 1.2 mg/dL 0.6  0.8    Alkaline Phos 44 - 121 IU/L 36  43    AST 0 - 40 IU/L 39  34    ALT 0 - 44 IU/L 25  24       Lab Results  Component Value Date   TSH 0.967 12/22/2022  Assessment/Plan: Encounter Diagnoses  Name Primary?   Hepatic cirrhosis, unspecified hepatic cirrhosis type, unspecified whether ascites present (HCC) Yes   Chronic anticoagulation     Paroxysmal atrial fibrillation (HCC)    Alcohol use disorder    History of colonic polyps    Cirrhosis EtOH use disorder  --CT in 01/2022 and repeat CT in 01/2023 both notable for nodular appearing liver contour and calcified granulomas of the liver, along with mild splenomegaly. CT angio 01/2023 showed cirrhosis with  with small volume ascites. 03/2023 elastography Mildly nodular liver contour but otherwise normal parenchymal echogenicity.  Patent portal vein with normal flow.  Despite the mildly nodular liver contour, elastography portion shows a medium kPa of 3.6 which is normal and not indicative of advanced chronic liver disease.   Plt 163. Normal LFT's. NR again elevated at 1.8, but stable from previous (1.7).  Suspect this is more related to the chronic anticoagulation that it is from impaired liver function.  - CBC, CMET, PT/INR --HCC screening with Abd u/s RUQ --Counseled on complete cessation of all EtOH --EGD to evaluate for esophageal varices or other features of portal hypertensive changes , (patient would like to do Jan 2026) --cardiac clearance for Xarelto  -Hold Xarelto  2 days days before procedure - will instruct when and how to resume after procedure. Low but real risk of cardiovascular event such as heart attack, stroke, embolism, thrombosis or ischemia/infarct of other organs off Xarelto  explained and need to seek urgent help if this occurs. The patient consents to proceed. Will communicate by phone or EMR with patient's prescribing provider to confirm that holding Xarelto  is reasonable in this case  -get LEC clearance from Norleen Schillings History of colon polyps - Repeat colonoscopy in 2028 for ongoing polyp surveillance CHF Atrial fibrillation Chronic anticoagulation   Thank you for the courtesy of this consult. Please call me with any questions or concerns.   Abraham Margulies, FNP-C [facility] 06/10/2024, 4:52 PM  Cc: Bulah Alm RAMAN, PA-C

## 2024-06-11 ENCOUNTER — Ambulatory Visit: Payer: Self-pay | Admitting: Gastroenterology

## 2024-06-15 ENCOUNTER — Other Ambulatory Visit: Payer: Self-pay | Admitting: Medical

## 2024-06-15 DIAGNOSIS — E878 Other disorders of electrolyte and fluid balance, not elsewhere classified: Secondary | ICD-10-CM

## 2024-06-16 ENCOUNTER — Ambulatory Visit (HOSPITAL_COMMUNITY)
Admission: RE | Admit: 2024-06-16 | Discharge: 2024-06-16 | Disposition: A | Discharge status: Home / Self Care | Source: Ambulatory Visit | Attending: Gastroenterology | Admitting: Gastroenterology

## 2024-06-16 DIAGNOSIS — K7460 Unspecified cirrhosis of liver: Secondary | ICD-10-CM | POA: Diagnosis present

## 2024-06-17 ENCOUNTER — Other Ambulatory Visit: Payer: Self-pay | Admitting: Medical

## 2024-06-26 NOTE — Progress Notes (Signed)
 Agree with the assessment and plan as outlined by [facility], FNP-C.  Carlitos Bottino, DO, [facility]

## 2024-06-27 ENCOUNTER — Other Ambulatory Visit: Payer: Self-pay | Admitting: Medical

## 2024-06-29 ENCOUNTER — Other Ambulatory Visit: Payer: Self-pay | Admitting: Cardiology

## 2024-07-01 ENCOUNTER — Other Ambulatory Visit: Payer: Self-pay | Admitting: Cardiology

## 2024-07-01 DIAGNOSIS — Z7901 Long term (current) use of anticoagulants: Secondary | ICD-10-CM

## 2024-07-01 DIAGNOSIS — I4819 Other persistent atrial fibrillation: Secondary | ICD-10-CM

## 2024-07-01 NOTE — Telephone Encounter (Signed)
 Prescription refill request for Xarelto  received.  Indication:afib Last office visit:6/25 Weight:83.6  kg Age:73 Scr:1.10  9/25 CrCl:71.78  ml/min  Prescription refilled

## 2024-07-12 ENCOUNTER — Emergency Department (HOSPITAL_COMMUNITY)

## 2024-07-12 ENCOUNTER — Other Ambulatory Visit: Payer: Self-pay

## 2024-07-12 ENCOUNTER — Encounter (HOSPITAL_COMMUNITY): Payer: Self-pay | Admitting: *Deleted

## 2024-07-12 ENCOUNTER — Emergency Department (HOSPITAL_COMMUNITY)
Admission: EM | Admit: 2024-07-12 | Discharge: 2024-07-12 | Disposition: A | Discharge status: Home / Self Care | Attending: Emergency Medicine | Admitting: Emergency Medicine

## 2024-07-12 DIAGNOSIS — R6884 Jaw pain: Secondary | ICD-10-CM | POA: Diagnosis not present

## 2024-07-12 DIAGNOSIS — R062 Wheezing: Secondary | ICD-10-CM | POA: Diagnosis not present

## 2024-07-12 DIAGNOSIS — R220 Localized swelling, mass and lump, head: Secondary | ICD-10-CM | POA: Insufficient documentation

## 2024-07-12 DIAGNOSIS — Z7951 Long term (current) use of inhaled steroids: Secondary | ICD-10-CM | POA: Diagnosis not present

## 2024-07-12 DIAGNOSIS — R5381 Other malaise: Secondary | ICD-10-CM | POA: Insufficient documentation

## 2024-07-12 DIAGNOSIS — J45909 Unspecified asthma, uncomplicated: Secondary | ICD-10-CM | POA: Diagnosis not present

## 2024-07-12 DIAGNOSIS — R5383 Other fatigue: Secondary | ICD-10-CM | POA: Insufficient documentation

## 2024-07-12 DIAGNOSIS — R059 Cough, unspecified: Secondary | ICD-10-CM | POA: Insufficient documentation

## 2024-07-12 DIAGNOSIS — Z7901 Long term (current) use of anticoagulants: Secondary | ICD-10-CM | POA: Diagnosis not present

## 2024-07-12 LAB — COMPREHENSIVE METABOLIC PANEL WITH GFR
ALT: 18 U/L (ref 0–44)
AST: 24 U/L (ref 15–41)
Albumin: 4.1 g/dL (ref 3.5–5.0)
Alkaline Phosphatase: 36 U/L — ABNORMAL LOW (ref 38–126)
Anion gap: 10 (ref 5–15)
BUN: 21 mg/dL (ref 8–23)
CO2: 30 mmol/L (ref 22–32)
Calcium: 9.8 mg/dL (ref 8.9–10.3)
Chloride: 99 mmol/L (ref 98–111)
Creatinine, Ser: 1.03 mg/dL (ref 0.61–1.24)
GFR, Estimated: >60 mL/min (ref >60)
Glucose, Bld: 173 mg/dL — ABNORMAL HIGH (ref 70–99)
Potassium: 3.8 mmol/L (ref 3.5–5.1)
Sodium: 138 mmol/L (ref 135–145)
Total Bilirubin: 0.7 mg/dL (ref 0.0–1.2)
Total Protein: 7.3 g/dL (ref 6.5–8.1)

## 2024-07-12 LAB — CBC WITH DIFFERENTIAL/PLATELET
Abs Immature Granulocytes: 0.03 K/uL (ref 0.00–0.07)
Basophils Absolute: 0 K/uL (ref 0.0–0.1)
Basophils Relative: 0 %
Eosinophils Absolute: 0.2 K/uL (ref 0.0–0.5)
Eosinophils Relative: 3 %
HCT: 44.7 % (ref 39.0–52.0)
Hemoglobin: 14.3 g/dL (ref 13.0–17.0)
Immature Granulocytes: 1 %
Lymphocytes Relative: 36 %
Lymphs Abs: 1.7 K/uL (ref 0.7–4.0)
MCH: 29.7 pg (ref 26.0–34.0)
MCHC: 32 g/dL (ref 30.0–36.0)
MCV: 92.9 fL (ref 80.0–100.0)
Monocytes Absolute: 0.4 K/uL (ref 0.1–1.0)
Monocytes Relative: 9 %
Neutro Abs: 2.4 K/uL (ref 1.7–7.7)
Neutrophils Relative %: 51 %
Platelets: 137 K/uL — ABNORMAL LOW (ref 150–400)
RBC: 4.81 MIL/uL (ref 4.22–5.81)
RDW: 12.8 % (ref 11.5–15.5)
WBC: 4.7 K/uL (ref 4.0–10.5)
nRBC: 0 % (ref 0.0–0.2)

## 2024-07-12 LAB — RESP PANEL BY RT-PCR (RSV, FLU A&B, COVID)  RVPGX2
Influenza A by PCR: NEGATIVE
Influenza B by PCR: NEGATIVE
Resp Syncytial Virus by PCR: NEGATIVE
SARS Coronavirus 2 by RT PCR: NEGATIVE

## 2024-07-12 LAB — I-STAT CG4 LACTIC ACID, ED: Lactic Acid, Venous: 0.9 mmol/L (ref 0.5–1.9)

## 2024-07-12 MED ORDER — AMOXICILLIN-POT CLAVULANATE 875-125 MG PO TABS: 1.0000 | ORAL_TABLET | Freq: Two times a day (BID) | ORAL | 0 refills | Status: DC

## 2024-07-12 NOTE — ED Triage Notes (Addendum)
 Pt presents with 2 days of sinus pressure, swelling rt face, ? Asthma flare, rales throughout with faint exp wheezing. Pt does have COPD. Couthing yellow/gray secretions.

## 2024-07-12 NOTE — ED Provider Notes (Signed)
 [place] [facility]ERGENC[facility] DEPARTMENT AT [facility] Provider Note   CSN: [id number] Arrival date & time: 07/12/24  9278     Patient presents with: Facial Pain, Facial Swelling, and asthma   Theodore Bush is a 73 y.o. male.   73 year old male with prior medical history as detailed below presents for evaluation.  Patient complains of approximately 48 hours of congestion, malaise, fatigue.  Patient reports some pain to the right face just behind the angle of his jaw.  He denies fever.  He denies difficulty breathing.  He reports that he uses breathing treatments intermittently at home.  He declines a breathing treatment at this time.  The history is provided by the patient and medical records.       Prior to Admission medications   Medication Sig Start Date End Date Taking? Authorizing Provider  ACCU-CHEK GUIDE test strip USE TO TEST 1 TO 2 TIMES PER DAY 06/06/22   Tysinger, Alm RAMAN, PA-C  Accu-Chek Softclix Lancets lancets USE TO TEST 1 TO 2 TIMES PER DAY 06/06/22   Tysinger, Alm RAMAN, PA-C  Adalimumab 40 MG/0.4ML PNKT Inject 1 each into the skin. 2 times a months.    [provider]  albuterol  (VENTOLIN  HFA) 108 (90 Base) MCG/ACT inhaler INHALE 1 PUFF INTO THE LUNGS EVERY 6 HOURS AS NEEDED FOR WHEEZING OR SHORTNESS OF BREATH 05/26/24   Tysinger, Alm RAMAN, PA-C  atorvastatin  (LIPITOR) 80 MG tablet TAKE 1 TABLET(80 MG) BY MOUTH DAILY 04/22/24   Jordan, Nkechi Linehan M, MD  Blood Glucose Monitoring Suppl (ACCU-CHEK GUIDE ME) w/Device KIT Test 1-2 times daily. Pt needs accu-chek meter. 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 Change every 15 days. 08/24/23   Thapa, [place], MD  Continuous Glucose Sensor (FREESTYLE LIBRE 3 SENSOR) MISC by Does not apply route.    [provider]  ENTRESTO  24-26 MG TAKE 1 TABLET BY MOUTH TWICE DAILY 03/31/24   Jordan, Seven Marengo M, MD  Fenofibrate  50 MG CAPS TAKE ONE CAPSULE BY MOUTH DAILY 03/18/24   Tysinger, Alm RAMAN, PA-C  HYDROcodone-acetaminophen (NORCO) 10-325 MG tablet Take 1 tablet by mouth every 6 (six) hours as needed.    [provider]  insulin  glargine (LANTUS ) 100 UNIT/ML injection Inject 100 Units into the skin daily.    [provider]  Insulin  Pen Needle (PEN NEEDLES) 32G X 4 MM MISC 1 each by Other route daily. 08/22/19   Henson, Vickie L, NP-C  JARDIANCE  25 MG TABS tablet TAKE 1 TABLET(25 MG) BY MOUTH DAILY 05/06/24   Thapa, [place], MD  metFORMIN  (GLUCOPHAGE -XR) 500 MG 24 hr tablet TAKE 4 TABLETS(2000 MG) BY MOUTH DAILY WITH BREAKFAST 06/17/24   Tysinger, Alm RAMAN, PA-C  montelukast  (SINGULAIR ) 10 MG tablet TAKE 1 TABLET(10 MG) BY MOUTH AT BEDTIME 03/25/24   Tysinger, Alm RAMAN, PA-C  Multiple Vitamin (MULTIVITAMIN WITH MINERALS) TABS tablet Take 1 tablet by mouth daily.    [provider]  repaglinide  (PRANDIN ) 1 MG tablet Take 1 tablet (1 mg total) by mouth 3 (three) times daily before meals. 08/24/23   Thapa, [place], MD  spironolactone  (ALDACTONE ) 25 MG tablet TAKE 1 TABLET(25 MG) BY MOUTH EVERY OTHER DAY 03/25/24   Tysinger, Alm RAMAN, PA-C  SYMBICORT  160-4.5 MCG/ACT inhaler INHALE 2  PUFFS INTO THE LUNGS TWICE DAILY 06/30/24   Tysinger, Alm RAMAN, PA-C  traZODone  (DESYREL ) 100 MG tablet TAKE ONE TABLET (100 MG TOTAL) BY MOUTH AT BEDTIME. 03/14/24   Tysinger, Alm RAMAN, PA-C  TRESIBA  FLEXTOUCH 100 UNIT/ML FlexTouch Pen ADMINISTER 20 UNITS UNDER THE SKIN DAILY 06/09/24   Thapa, [place], MD  XARELTO  20 MG TABS tablet TAKE ONE TABLET BY MOUTH DAILY 07/01/24   Jordan, Alleah Dearman M, MD    Allergies: Wellbutrin  [bupropion ] and Quinolones    Review of Systems  Constitutional:  Negative for chills and fever.  HENT:  Negative for ear pain and sore throat.   Eyes:  Negative for pain and visual disturbance.  Respiratory:  Negative  for cough and shortness of breath.   Cardiovascular:  Negative for chest pain and palpitations.  Gastrointestinal:  Negative for abdominal pain and vomiting.  Genitourinary:  Negative for dysuria and hematuria.  Musculoskeletal:  Negative for arthralgias and back pain.  Skin:  Negative for color change and rash.  Neurological:  Negative for seizures and syncope.  All other systems reviewed and are negative.   Updated Vital Signs BP (!) 162/99   Pulse 74   Temp 97.6 F (36.4 C) (Oral)   Resp 20   Ht 5' 8 (1.727 m)   Wt 83.9 kg   SpO2 99%   BMI 28.13 kg/m   Physical Exam Vitals and nursing note reviewed.  Constitutional:      General: He is not in acute distress.    Appearance: He is well-developed.  HENT:     Head: Normocephalic and atraumatic.     Ears:     Comments: Mild tenderness with palpation just posterior to the angle of the right jaw.  Likely tender lymph node palpated. Eyes:     Conjunctiva/sclera: Conjunctivae normal.  Cardiovascular:     Rate and Rhythm: Normal rate and regular rhythm.     Heart sounds: No murmur heard. Pulmonary:     Effort: Pulmonary effort is normal. No respiratory distress.     Comments: Faint scattered expiratory wheezes in all lung fields Abdominal:     Palpations: Abdomen is soft.     Tenderness: There is no abdominal tenderness.  Musculoskeletal:        General: No swelling.     Cervical back: Neck supple.  Skin:    General: Skin is warm and dry.     Capillary Refill: Capillary refill takes less than 2 seconds.  Neurological:     Mental Status: He is alert.  Psychiatric:        Mood and Affect: Mood normal.     (all labs ordered are listed, but only abnormal results are displayed) Labs Reviewed  CBC WITH DIFFERENTIAL/PLATELET - Abnormal; Notable for the following components:      Result Value   Platelets 137 (*)    All other components within normal limits  RESP PANEL BY RT-PCR (RSV, FLU A&B, COVID)  [id number]   COMPREHENSIVE METABOLIC PANEL WITH GFR  I-STAT CG4 LACTIC ACID, ED    EKG: None  Radiology: No results found.   Procedures   Medications Ordered in the ED - No data to display                                  Medical Decision Making Patient is presenting with symptoms most suggestive of viral syndrome with sinus pressure, congestion, mild cough.  He  complains of pain just posterior to the right jaw.  Exam suggest that this is probably from a inflamed lymph node.  Obtained screening labs and imaging are without acute abnormality.  Patient is reassured by normality of workup.  However, we will start a course of Augmentin to treat possible sinusitis versus possible early dental infection.  Importance of close follow-up is stressed.  Strict return precautions given and understood.  Amount and/or Complexity of Data Reviewed Labs: ordered. Radiology: ordered.  Risk Prescription drug management.        Final diagnoses:  Facial swelling    ED Discharge Orders          Ordered    amoxicillin-clavulanate (AUGMENTIN) 875-125 MG tablet  Every 12 hours        07/12/24 1125               Laurice Maude BROCKS, MD 07/12/24 1128

## 2024-07-12 NOTE — Discharge Instructions (Signed)
 Return for any problem.  ?

## 2024-07-12 NOTE — ED Notes (Signed)
 Patient ambulated to the bathroom.

## 2024-07-16 ENCOUNTER — Other Ambulatory Visit: Payer: Self-pay

## 2024-07-16 DIAGNOSIS — Z794 Long term (current) use of insulin: Secondary | ICD-10-CM

## 2024-07-16 MED ORDER — REPAGLINIDE 1 MG PO TABS: 1.0000 mg | ORAL_TABLET | Freq: Three times a day (TID) | ORAL | 3 refills | Status: AC

## 2024-07-24 ENCOUNTER — Ambulatory Visit (INDEPENDENT_AMBULATORY_CARE_PROVIDER_SITE_OTHER): Payer: Self-pay

## 2024-07-24 VITALS — Ht 68.0 in | Wt 185.0 lb

## 2024-07-24 DIAGNOSIS — Z0000 Encounter for general adult medical examination without abnormal findings: Secondary | ICD-10-CM

## 2024-07-24 NOTE — Patient Instructions (Signed)
 Theodore Bush,  Thank you for taking the time for your Medicare Wellness Visit. I appreciate your continued commitment to your health goals. Please review the care plan we discussed, and feel free to reach out if I can assist you further.  Please note that Annual Wellness Visits do not include a physical exam. Some assessments may be limited, especially if the visit was conducted virtually. If needed, we may recommend an in-person follow-up with your provider.  Ongoing Care Seeing your primary care provider every 3 to 6 months helps us  monitor your health and provide consistent, personalized care.   Referrals If a referral was made during today's visit and you haven't received any updates within two weeks, please contact the referred provider directly to check on the status.  Recommended Screenings:  Health Maintenance  Topic Date Due   DTaP/Tdap/Td vaccine (1 - Tdap) Never done   Zoster (Shingles) Vaccine (2 of 2) 03/24/2024   COVID-19 Vaccine (8 - 2025-26 season) 05/19/2024   Hemoglobin A1C  11/02/2024   Complete foot exam   12/13/2024   Yearly kidney health urinalysis for diabetes  05/02/2025   Eye exam for diabetics  06/02/2025   Yearly kidney function blood test for diabetes  07/12/2025   Medicare Annual Wellness Visit  07/24/2025   Colon Cancer Screening  01/07/2027   Pneumococcal Vaccine for age over 108  Completed   Flu Shot  Completed   Hepatitis C Screening  Completed   Meningitis B Vaccine  Aged Out       07/22/2024    5:34 AM  Advanced Directives  Does Patient Have a Medical Advance Directive? Yes  Type of Advance Directive Healthcare Power of Attorney  Does patient want to make changes to medical advance directive? No - Patient declined  Copy of Healthcare Power of Attorney in Chart? Yes - validated most recent copy scanned in chart (See row information)    Vision: Annual vision screenings are recommended for early detection of glaucoma, cataracts, and diabetic  retinopathy. These exams can also reveal signs of chronic conditions such as diabetes and high blood pressure.  Dental: Annual dental screenings help detect early signs of oral cancer, gum disease, and other conditions linked to overall health, including heart disease and diabetes.  Please see the attached documents for additional preventive care recommendations.

## 2024-07-24 NOTE — Progress Notes (Signed)
 Subjective:   Theodore Bush is a 73 y.o. male who presents for a Medicare Annual Wellness Visit.  I connected with  Theodore Bush on 07/24/24 by a video and audio enabled telemedicine application and verified that I am speaking with the correct person using two identifiers.  Patient Location: Home  Provider Location: Home Office  Persons Participating in Visit: Patient.  I discussed the limitations of evaluation and management by telemedicine. The patient expressed understanding and agreed to proceed.  Vital Signs: Because this visit was a virtual/telehealth visit, some criteria may be missing or patient reported. Any vitals not documented were not able to be obtained and vitals that have been documented are patient reported.   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   Arthritis    Asthma    Atrial fibrillation (HCC) 09/02/2018   CHF (congestive heart failure) (HCC)    Chronic kidney disease 06/11/2019   Cirrhosis (HCC)    Controlled diabetes mellitus type 2 with complications (HCC) 09/02/2018   Controlled type 2 diabetes mellitus with complication, with long-term current use of insulin  (HCC) 09/02/2018   COPD (chronic obstructive pulmonary disease) (HCC)    Coronary artery calcification 06/11/2019   Hyperlipidemia    Hypertension    Insomnia 06/11/2019   Pneumonia 2019   Sleep apnea    he was 280lb at the time   Staph infection    to elbow 15-80yrs ago   Type 2 diabetes mellitus with hyperglycemia, without long-term current use of insulin  (HCC) 02/03/2022   Past Surgical History:  Procedure Laterality Date   COLONOSCOPY  12/2021   EYE SURGERY     PALATE / UVULA BIOPSY / EXCISION     trimmed several times   Family History  Problem Relation Age of Onset   Lupus Mother    Hypertension Mother    Diabetes Father    Diabetes Paternal  Grandmother    Colon cancer Neg Hx    Stomach cancer Neg Hx    Esophageal cancer Neg Hx    Rectal cancer Neg Hx    Social History   Occupational History   Occupation: Retired    Comment: Civil service [employer]  Tobacco Use   Smoking status: Never   Smokeless tobacco: Current    Types: Snuff  Vaping Use   Vaping status: Never Used  Substance and Sexual Activity   Alcohol use: Yes    Alcohol/week: 10.0 standard drinks of alcohol    Types: 10 Shots of liquor per week    Comment: 10 drinks a week   Drug use: Yes    Types: Hydrocodone    Comment: occ, once a week per patient.   Sexual activity: Not Currently   Tobacco Counseling Ready to quit: No Counseling given: Not Answered  SDOH Screenings   Food Insecurity: No Food Insecurity (07/22/2024)  Housing: Low Risk  (07/22/2024)  Transportation Needs: No Transportation Needs (07/22/2024)  Utilities: Not At Risk (07/24/2024)  Alcohol Screen: Low Risk  (07/22/2024)  Depression (PHQ2-9): Low Risk  (07/24/2024)  Financial Resource Strain: Low Risk  (07/22/2024)  Physical Activity: Insufficiently Active (07/22/2024)  Social Connections: Moderately Isolated (07/22/2024)  Stress: Stress Concern Present (07/22/2024)  Tobacco Use: High Risk (07/24/2024)  Health Literacy: Adequate Health Literacy (07/24/2024)   Depression Screen    07/24/2024    2:15 PM 06/15/2023   10:36 AM 12/01/2022   10:29 AM  06/09/2022    9:52 AM 02/03/2022    3:42 PM 09/02/2021    1:45 PM 06/10/2021   11:51 AM  PHQ 2/9 Scores  PHQ - 2 Score 0 0 0 0 0 0 0  PHQ- 9 Score  0           Data saved with a previous flowsheet row definition      Goals Addressed             This Visit's Progress    Maintain health and independence   On track      Visit info / Clinical Intake: Medicare Wellness Visit Type:: Subsequent Annual Wellness Visit Medicare Wellness Visit Mode:: Video If telephone or video:: vitals recorded from last visit Interpreter Needed?: No Pre-visit  prep was completed: yes AWV questionnaire completed by patient prior to visit?: yes Date:: 07/22/24 Living arrangements:: lives with spouse/significant other Patient's Overall Health Status Rating: good Typical amount of pain: some Does pain affect daily life?: no Are you currently prescribed opioids?: (!) yes  Dietary Habits and Nutritional Risks How many meals a day?: 2 Eats fruit and vegetables daily?: yes Most meals are obtained by: preparing own meals Diabetic:: (!) yes Any non-healing wounds?: no How often do you check your BS?: continuous glucose monitor Would you like to be referred to a Nutritionist or for Diabetic Management? : no (currently being seen)  Functional Status Activities of Daily Living (to include ambulation/medication): (Patient-Rptd) Independent Ambulation: (Patient-Rptd) Independent Medication Administration: Independent Home Management: (Patient-Rptd) Independent Manage your own finances?: yes Primary transportation is: driving Concerns about vision?: no *vision screening is required for WTM* Concerns about hearing?: no  Fall Screening Falls in the past year?: (Patient-Rptd) 0 Number of falls in past year: 0 Was there an injury with Fall?: 0 Fall Risk Category Calculator: 0 Patient Fall Risk Level: Low Fall Risk  Fall Risk Patient at Risk for Falls Due to: No Fall Risks Fall risk Follow up: Falls evaluation completed; Education provided; Falls prevention discussed  Home and Transportation Safety: All rugs have non-skid backing?: yes All stairs or steps have railings?: yes Grab bars in the bathtub or shower?: yes Have non-skid surface in bathtub or shower?: yes Good home lighting?: yes Regular seat belt use?: yes Hospital stays in the last year:: no  Cognitive Assessment Difficulty concentrating, remembering, or making decisions? : no Will 6CIT or Mini Cog be Completed: no 6CIT or Mini Cog Declined: patient alert, oriented, able to answer  questions appropriately and recall recent events  Advance Directives (For Healthcare) Does Patient Have a Medical Advance Directive?: Yes Does patient want to make changes to medical advance directive?: No - Patient declined Type of Advance Directive: Healthcare Power of Attorney Copy of Healthcare Power of Attorney in Chart?: Yes - validated most recent copy scanned in chart (See row information)  Reviewed/Updated  Reviewed/Updated: All        Objective:    Today's Vitals   07/24/24 1401  Weight: 185 lb (83.9 kg)  Height: 5' 8 (1.727 m)   Body mass index is 28.13 kg/m.  Current Medications (verified) Outpatient Encounter Medications as of 07/24/2024  Medication Sig   ACCU-CHEK GUIDE test strip USE TO TEST 1 TO 2 TIMES PER DAY   Accu-Chek Softclix Lancets lancets USE TO TEST 1 TO 2 TIMES PER DAY   Adalimumab 40 MG/0.4ML PNKT Inject 1 each into the skin. 2 times a months.   albuterol  (VENTOLIN  HFA) 108 (90 Base) MCG/ACT inhaler INHALE 1  PUFF INTO THE LUNGS EVERY 6 HOURS AS NEEDED FOR WHEEZING OR SHORTNESS OF BREATH   amoxicillin-clavulanate (AUGMENTIN) 875-125 MG tablet Take 1 tablet by mouth every 12 (twelve) hours.   atorvastatin  (LIPITOR) 80 MG tablet TAKE 1 TABLET(80 MG) BY MOUTH DAILY   Blood Glucose Monitoring Suppl (ACCU-CHEK GUIDE ME) w/Device KIT Test 1-2 times daily. Pt needs accu-chek meter. Dx. Ell.9   carvedilol  (COREG ) 25 MG tablet TAKE 1 TABLET(25 MG) BY MOUTH TWICE DAILY   Continuous Glucose Sensor (FREESTYLE LIBRE 3 PLUS SENSOR) MISC 1 each by Does not apply route continuous. Change every 15 days.   Continuous Glucose Sensor (FREESTYLE LIBRE 3 SENSOR) MISC by Does not apply route.   ENTRESTO  24-26 MG TAKE 1 TABLET BY MOUTH TWICE DAILY   Fenofibrate  50 MG CAPS TAKE ONE CAPSULE BY MOUTH DAILY   HYDROcodone-acetaminophen (NORCO) 10-325 MG tablet Take 1 tablet by mouth every 6 (six) hours as needed.   insulin  glargine (LANTUS ) 100 UNIT/ML injection Inject 100 Units  into the skin daily.   Insulin  Pen Needle (PEN NEEDLES) 32G X 4 MM MISC 1 each by Other route daily.   JARDIANCE  25 MG TABS tablet TAKE 1 TABLET(25 MG) BY MOUTH DAILY   metFORMIN  (GLUCOPHAGE -XR) 500 MG 24 hr tablet TAKE 4 TABLETS(2000 MG) BY MOUTH DAILY WITH BREAKFAST   montelukast  (SINGULAIR ) 10 MG tablet TAKE 1 TABLET(10 MG) BY MOUTH AT BEDTIME   Multiple Vitamin (MULTIVITAMIN WITH MINERALS) TABS tablet Take 1 tablet by mouth daily.   repaglinide  (PRANDIN ) 1 MG tablet Take 1 tablet (1 mg total) by mouth 3 (three) times daily before meals.   spironolactone  (ALDACTONE ) 25 MG tablet TAKE 1 TABLET(25 MG) BY MOUTH EVERY OTHER DAY   SYMBICORT  160-4.5 MCG/ACT inhaler INHALE 2 PUFFS INTO THE LUNGS TWICE DAILY   traZODone  (DESYREL ) 100 MG tablet TAKE ONE TABLET (100 MG TOTAL) BY MOUTH AT BEDTIME.   TRESIBA  FLEXTOUCH 100 UNIT/ML FlexTouch Pen ADMINISTER 20 UNITS UNDER THE SKIN DAILY   XARELTO  20 MG TABS tablet TAKE ONE TABLET BY MOUTH DAILY   No facility-administered encounter medications on file as of 07/24/2024.   Hearing/Vision screen Vision Screening - Comments::  up to date with routine eye exams with [facility]  Immunizations and Health Maintenance Health Maintenance  Topic Date Due   DTaP/Tdap/Td (1 - Tdap) Never done   Zoster Vaccines- Shingrix (2 of 2) 03/24/2024   COVID-19 Vaccine (8 - 2025-26 season) 05/19/2024   HEMOGLOBIN A1C  11/02/2024   FOOT EXAM  12/13/2024   Diabetic kidney evaluation - Urine ACR  05/02/2025   OPHTHALMOLOGY EXAM  06/02/2025   Diabetic kidney evaluation - eGFR measurement  07/12/2025   Medicare Annual Wellness (AWV)  07/24/2025   Colonoscopy  01/07/2027   Pneumococcal Vaccine: 50+ Years  Completed   Influenza Vaccine  Completed   Hepatitis C Screening  Completed   Meningococcal B Vaccine  Aged Out        Assessment/Plan:  This is a routine wellness examination for Stein.  Patient Care Team: Tysinger, Alm GORMAN RIGGERS as PCP - General (Family  Medicine) Jordan, Peter M, MD as PCP - Cardiology (Cardiology) [facility] Shyrl Linnie KIDD, MD as Consulting Physician (Cardiothoracic Surgery) [place], Heather, OD (Optometry) [place] GAILS, DO as Consulting Physician (Gastroenterology) Thapa, Sudan, MD as Consulting Physician (Endocrinology)  I have personally reviewed and noted the following in the patient's chart:   Medical and social history Use of alcohol, tobacco or illicit drugs  Current medications and supplements including opioid prescriptions. Functional ability and status Nutritional status Physical activity Advanced directives List of other physicians Hospitalizations, surgeries, and ER visits in previous 12 months Vitals Screenings to include cognitive, depression, and falls Referrals and appointments  No orders of the defined types were placed in this encounter.  In addition, I have reviewed and discussed with patient certain preventive protocols, quality metrics, and best practice recommendations. A written personalized care plan for preventive services as well as general preventive health recommendations were provided to patient.   Theodore Charmaine Browner, LPN   [id number]/11/7972   Return in 1 year (on 07/24/2025).  After Visit Summary: (MyChart) Due to this being a telephonic visit, the after visit summary with patients personalized plan was offered to patient via MyChart   Nurse Notes: No concerns

## 2024-07-28 ENCOUNTER — Other Ambulatory Visit: Payer: Self-pay

## 2024-07-28 DIAGNOSIS — J4540 Moderate persistent asthma, uncomplicated: Secondary | ICD-10-CM

## 2024-07-28 MED ORDER — ALBUTEROL SULFATE HFA 108 (90 BASE) MCG/ACT IN AERS: INHALATION_SPRAY | RESPIRATORY_TRACT | 1 refills | Status: DC

## 2024-07-28 NOTE — Telephone Encounter (Signed)
 Patient seen for AWV and is requesting a refill of Albuterol  inhaler.

## 2024-08-05 ENCOUNTER — Ambulatory Visit: Admitting: Endocrinology

## 2024-08-12 ENCOUNTER — Other Ambulatory Visit: Payer: Self-pay | Admitting: Medical

## 2024-08-12 ENCOUNTER — Telehealth: Payer: Self-pay | Admitting: Medical

## 2024-08-12 MED ORDER — TRAZODONE HCL 100 MG PO TABS: 100.0000 mg | ORAL_TABLET | Freq: Every day | ORAL | 0 refills | Status: DC

## 2024-08-12 NOTE — Telephone Encounter (Signed)
 Copied from CRM [id number]. Topic: Clinical - Prescription Issue >> Aug 12, 2024 12:16 PM Wess RAMAN wrote: Reason for CRM: Patient states he is currently traveling and would like traZODone  (DESYREL ) 100 MG tablet sent to [facility] instead of [facility]  Callback #: [phone]  Pharmacy: [id number] - [place], [place] - [address] AT [facility] [address] [place] [address] Phone: [phone] Fax: [phone] Hours: Not open 24 hours

## 2024-08-18 NOTE — Progress Notes (Deleted)
 Cardiology Office Note   Date:  08/18/2024   ID:  Theodore Bush, DOB Jun 25, 1951, MRN [id number]  PCP:  Bulah Alm RAMAN, PA-C  Cardiologist:   Anaira Seay, MD   No chief complaint on file.     History of Present Illness: Theodore Bush is a 73 y.o. male who is seen for follow up  of CHF and history of Afib.  Patient moved to [facility]  to be closer to family. Moved from [facility]. He has a history of atrial fibrillation, CHF and CAD. He reports he has been admitted a couple of times for PNA and at one time had fluid in the lungs and required thoracentesis. He has a difficult time remembering when he was hospitalized or any details about prior cardiac work up. States he did have a stress test at some point in the past. We do have records of a chest CT done for a pulmonary nodule that showed severe coronary calcification. No prior history of MI. He has a history of AFib.  Initially on coumadin but later switched to Xarelto . 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 He has a history of DM on insulin , HTN, and HLD. Has CKD stage 3. Has a thoracic aortic aneurysm.   An Echo was obtained here in 2020 showing an EF of 35-40%.Global hypokinesis. Aortic root enlarged to 49 mm. CT chest done showing aneurysm of 4.7 cm. Coronary calcification. Scattered pulmonary nodules. Evidence of cirrhosis. Heart failure therapy has been optimized with Entresto , Jardiance , coreg , and aldactone . Repeat Echo in July 2023 showed no change. Last CT chest in May showed 4.9 thoracic aneurysm. Followed by Dr Shyrl. Seen in May and CT showed no change in aneurysm 4.9 cm.  On follow up today he is doing well. 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 Denies any palpitations, dizziness, chest pain or SOB. No edema.   Feels well overall. Did stop drinking hard liquor but still drinks a couple of beers.      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/2019   Arthritis    Asthma    Atrial fibrillation (HCC) 09/02/2018   CHF (congestive heart failure) (HCC)    Chronic kidney disease 06/11/2019   Cirrhosis (HCC)    Controlled diabetes mellitus type 2 with complications (HCC) 09/02/2018   Controlled type 2 diabetes mellitus with complication, with long-term current use of insulin  (HCC) 09/02/2018   COPD (chronic obstructive pulmonary disease) (HCC)    Coronary artery calcification 06/11/2019   Hyperlipidemia    Hypertension    Insomnia 06/11/2019   Pneumonia 2019   Sleep apnea    he was 280lb at the time   Staph infection    to elbow 15-25yrs ago   Type 2 diabetes mellitus with hyperglycemia, without long-term current use of insulin  (HCC) 02/03/2022    Past Surgical History:  Procedure Laterality Date   COLONOSCOPY  12/2021   EYE SURGERY     PALATE / UVULA BIOPSY / EXCISION     trimmed several times     Current Outpatient Medications  Medication Sig Dispense Refill   ACCU-CHEK GUIDE test strip USE TO TEST 1 TO 2 TIMES PER DAY 100 strip 3   Accu-Chek Softclix Lancets lancets USE TO TEST 1 TO 2 TIMES PER DAY 100 each 3   Adalimumab 40 MG/0.4ML PNKT Inject 1  each into the skin. 2 times a months.     albuterol  (VENTOLIN  HFA) 108 (90 Base) MCG/ACT inhaler INHALE 1 PUFF INTO THE LUNGS EVERY 6 HOURS AS NEEDED FOR WHEEZING OR SHORTNESS OF BREATH 18 g 1   amoxicillin -clavulanate (AUGMENTIN ) 875-125 MG tablet Take 1 tablet by mouth every 12 (twelve) hours. 14 tablet 0   atorvastatin  (LIPITOR) 80 MG tablet TAKE 1 TABLET(80 MG) BY MOUTH DAILY 90 tablet 3   Blood Glucose Monitoring Suppl (ACCU-CHEK GUIDE ME) w/Device KIT Test 1-2 times daily. Pt needs accu-chek meter. Dx. Ell.9 1 kit 0   carvedilol  (COREG ) 25 MG tablet TAKE 1 TABLET(25 MG) BY MOUTH TWICE DAILY 180 tablet 2   Continuous Glucose Sensor (FREESTYLE LIBRE 3 PLUS SENSOR) MISC 1 each by Does not apply route continuous. Change every 15 days. 6  each 3   Continuous Glucose Sensor (FREESTYLE LIBRE 3 SENSOR) MISC by Does not apply route.     ENTRESTO  24-26 MG TAKE 1 TABLET BY MOUTH TWICE DAILY 180 tablet 3   Fenofibrate  50 MG CAPS TAKE ONE CAPSULE BY MOUTH DAILY 90 capsule 2   HYDROcodone-acetaminophen (NORCO) 10-325 MG tablet Take 1 tablet by mouth every 6 (six) hours as needed.     insulin  glargine (LANTUS ) 100 UNIT/ML injection Inject 100 Units into the skin daily.     Insulin  Pen Needle (PEN NEEDLES) 32G X 4 MM MISC 1 each by Other route daily. 100 each 2   JARDIANCE  25 MG TABS tablet TAKE 1 TABLET(25 MG) BY MOUTH DAILY 90 tablet 3   metFORMIN  (GLUCOPHAGE -XR) 500 MG 24 hr tablet TAKE 4 TABLETS(2000 MG) BY MOUTH DAILY WITH BREAKFAST 120 tablet 5   montelukast  (SINGULAIR ) 10 MG tablet TAKE 1 TABLET(10 MG) BY MOUTH AT BEDTIME 90 tablet 1   Multiple Vitamin (MULTIVITAMIN WITH MINERALS) TABS tablet Take 1 tablet by mouth daily.     repaglinide  (PRANDIN ) 1 MG tablet Take 1 tablet (1 mg total) by mouth 3 (three) times daily before meals. 270 tablet 3   spironolactone  (ALDACTONE ) 25 MG tablet TAKE 1 TABLET(25 MG) BY MOUTH EVERY OTHER DAY 45 tablet 1   SYMBICORT  160-4.5 MCG/ACT inhaler INHALE 2 PUFFS INTO THE LUNGS TWICE DAILY 10.2 g 2   traZODone  (DESYREL ) 100 MG tablet Take 1 tablet (100 mg total) by mouth at bedtime. 30 tablet 0   TRESIBA  FLEXTOUCH 100 UNIT/ML FlexTouch Pen ADMINISTER 20 UNITS UNDER THE SKIN DAILY 15 mL 3   XARELTO  20 MG TABS tablet TAKE ONE TABLET BY MOUTH DAILY 90 tablet 1   No current facility-administered medications for this visit.    Allergies:   Wellbutrin  [bupropion ] and Quinolones    Social History:  The patient  reports that he has never smoked. His smokeless tobacco use includes snuff. He reports current alcohol use of about 10.0 standard drinks of alcohol per week. He reports current drug use. Drug: Hydrocodone.   Family History:  The patient's family history includes Diabetes in his father and paternal  grandmother; Hypertension in his mother; Lupus in his mother.    ROS:  Please see the history of present illness.   Otherwise, review of systems are positive for none.   All other systems are reviewed and negative.    PHYSICAL EXAM: VS:  There were no vitals taken for this visit. , BMI There is no height or weight on file to calculate BMI. GEN: Well nourished, well developed, in no acute distress  HEENT: normal  Neck: no JVD, carotid  bruits, or masses Cardiac: IRRR; no murmurs, rubs, or gallops,no edema  Respiratory:  Few scattered wheezes.  GI: soft, nontender, nondistended, + BS MS: no deformity or atrophy  Skin: warm and dry, no rash Neuro:  Strength and sensation are intact Psych: euthymic mood, full affect         Recent Labs: 07/12/2024: ALT 18; BUN 21; Creatinine, Ser 1.03; Hemoglobin 14.3; Platelets 137; Potassium 3.8; Sodium 138    Lipid Panel    Component Value Date/Time   CHOL 130 07/13/2023 1038   TRIG 62 07/13/2023 1038   HDL 59 07/13/2023 1038   CHOLHDL 2.2 07/13/2023 1038   LDLCALC 58 07/13/2023 1038   LDLDIRECT 51.0 06/02/2022 1052      Wt Readings from Last 3 Encounters:  07/24/24 185 lb (83.9 kg)  07/12/24 185 lb (83.9 kg)  06/10/24 184 lb 4 oz (83.6 kg)      Other studies Reviewed: Additional studies/ records that were reviewed today include:   Echo 06/26/19: IMPRESSIONS      1. Left ventricular ejection fraction, by visual estimation, is 35 to 40%. The left ventricle has moderately decreased function. Normal left ventricular size. There is mildly increased left ventricular hypertrophy. Diffuse hypokinesis.  2. Left ventricular diastolic Doppler parameters are indeterminate pattern of LV diastolic filling. Rhythm uncertain.  3. Trivial pericardial effusion is present.  4. Moderate mitral annular calcification.  5. The mitral valve is normal in structure. Trace mitral valve regurgitation. No evidence of mitral stenosis.  6. The tricuspid  valve is normal in structure. Tricuspid valve regurgitation is trivial.  7. The aortic valve is tricuspid Aortic valve regurgitation is mild by color flow Doppler. Mild to moderate aortic valve sclerosis/calcification without any evidence of aortic stenosis.  8. There is severe dilatation of the ascending aorta measuring 49 mm. Suggest MRA or CTA chest to further evaluate.  9. Global right ventricle has mildly reduced systolic function.The right ventricular size is normal. No increase in right ventricular wall thickness. 10. Left atrial size was moderately dilated. 11. Right atrial size was mildly dilated. 12. The inferior vena cava is normal in size with greater than 50% respiratory variability, suggesting right atrial pressure of 3 mmHg. 13. TR signal is inadequate for assessing pulmonary artery systolic pressure.   Chest CT 07/04/19: IMPRESSION: 1. No acute intrathoracic pathology. No CT evidence of aortic dissection. 2. Dilated ascending aorta measuring up to 4.7 cm in diameter. Ascending thoracic aortic aneurysm. Recommend semi-annual imaging followup by CTA or MRA and referral to cardiothoracic surgery if not already obtained. This recommendation follows 2010 ACCF/AHA/AATS/ACR/ASA/SCA/SCAI/SIR/STS/SVM Guidelines for the Diagnosis and Management of Patients With Thoracic Aortic Disease. Circulation. 2010; 121: Z733-z630. Aortic aneurysm NOS (ICD10-I71.9) 3. Cardiomegaly with 3 vessel coronary vascular calcification. 4. Small pericardial effusion. 5. Several small pulmonary nodules measure up to 4 mm. No follow-up needed if patient is low-risk (and has no known or suspected primary neoplasm). Non-contrast chest CT can be considered in 12 months if patient is high-risk. This recommendation follows the consensus statement: Guidelines for Management of Incidental Pulmonary Nodules Detected on CT Images: From the Fleischner Society 2017; Radiology 2017; 284:228-243. 6.  Cirrhosis.  CLINICAL DATA:  Follow-up thoracic aortic aneurysm.   EXAM: CT ANGIOGRAPHY CHEST WITH CONTRAST   TECHNIQUE: Multidetector CT imaging of the chest was performed using the standard protocol during bolus administration of intravenous contrast. Multiplanar CT image reconstructions and MIPs were obtained to evaluate the vascular anatomy.   CONTRAST:  OMNIPAQUE  IOHEXOL  350  MG/ML SOLN   COMPARISON:  Chest CTA 07/04/2019   FINDINGS: Cardiovascular: Aortic root at the sinuses of Valsalva measures 4.5 cm. Sinotubular junction measures 3.4 cm. Fusiform aneurysm of the ascending thoracic aorta measures up to 4.9 cm on sequence 4, image 57 and this is unchanged from the prior examination. Coronary artery calcifications. Aortic arch measures 3.5 cm and stable. Great vessels are patent. Proximal vertebral arteries are patent. Incidentally, the left vertebral artery originates from the aortic arch near the origin of the left subclavian artery. Atherosclerotic calcifications involving the thoracic aorta. Descending thoracic aorta measures 3.3 cm and stable. Distal descending thoracic aorta measures 2.9 cm and stable. Negative for aortic dissection. Proximal abdominal aorta is patent with atherosclerotic disease. Variant celiac artery anatomy. There is a common trunk for the left gastric artery and splenic artery. Common hepatic artery originates directly from the abdominal aorta. SMA is widely patent at the origin. Bilateral renal arteries are patent with calcified plaque at the origin but no significant stenosis. Mitral annular calcifications. Small amount of pericardial fluid. Main pulmonary artery is enlarged measuring up to 4.2 cm. Main and central pulmonary arteries are patent.   Mediastinum/Nodes: Thyroid tissue is unremarkable. No enlarged mediastinal or hilar lymph nodes. No enlarged axillary lymph nodes.   Lungs/Pleura: Filling defect along the posterior aspect of  the upper trachea and a small linear filling defect extending into the right mainstem bronchus. These endobronchial filling defects likely represent adherent mucus. No significant pleural effusions. Stable tiny calcified granuloma in the anterior right upper lobe on sequence 6 image 59. Stable linear density in the right lower lobe on sequence 6, image 85. Focal volume loss and possible scarring along posterior aspect of the left upper lobe and lingular region. Stable tiny peripheral nodular density in the left lower lobe on sequence 6, image 116. No significant airspace disease or consolidation in the lungs.   Upper Abdomen: Chronic calcification in the right hepatic lobe. Liver contour is slightly nodular particularly along the anterior left hepatic lobe. No acute abnormality in the upper abdomen.   Musculoskeletal: Degenerative changes in lower cervical spine. Bridging osteophytes in thoracic spine.   Review of the MIP images confirms the above findings.   IMPRESSION: 1. 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 Recommend semi-annual imaging followup by CTA or MRA and referral to cardiothoracic surgery if not already obtained. This recommendation follows 2010 ACCF/AHA/AATS/ACR/ASA/SCA/SCAI/SIR/STS/SVM Guidelines for the Diagnosis and Management of Patients With Thoracic Aortic Disease. Circulation. 2010; 121: Z733-z630. Aortic aneurysm NOS (ICD10-I71.9) 2. Liver contour is slightly nodular particularly in the anterior left hepatic lobe. Early cirrhotic changes cannot be excluded. 3. Enlarged pulmonary arteries. Findings can be associated with pulmonary hypertension. 4. Aortic Atherosclerosis (ICD10-I70.0). Coronary artery calcifications.     Electronically Signed   By: Juliene Balder M.D.   On: 07/08/2020 10:29  Echo 04/07/22: IMPRESSIONS     1. Left ventricular ejection fraction, by estimation, is 35 to 40%. Left  ventricular ejection  fraction by 3D volume is 36 %. The left ventricle has  moderately decreased function. The left ventricle demonstrates global  hypokinesis. The left ventricular  internal cavity size was mildly dilated. There is mild concentric left  ventricular hypertrophy. Left ventricular diastolic parameters are  consistent with Grade II diastolic dysfunction (pseudonormalization).   2. Right ventricular systolic function is normal. The right ventricular  size is normal. There is mildly elevated pulmonary artery systolic  pressure. The estimated right ventricular systolic  pressure is 44.5 mmHg.   3. Left atrial size was severely dilated.   4. Right atrial size was mild to moderately dilated.   5. A small pericardial effusion is present. The pericardial effusion is  circumferential. There is no evidence of cardiac tamponade.   6. The mitral valve is grossly normal. Mild to moderate mitral valve  regurgitation. The mean mitral valve gradient is 3.2 mmHg with average  heart rate of 60 bpm. Moderate mitral annular calcification.   7. Tricuspid valve regurgitation is mild to moderate.   8. The aortic valve is tricuspid. Aortic valve regurgitation is mild to  moderate. Aortic valve sclerosis is present, with no evidence of aortic  valve stenosis.   9. Pulmonic valve regurgitation is moderate.  10. Aortic dilatation noted. There is mild dilatation of the aortic root,  measuring 43 mm. There is severe dilatation of the ascending aorta,  measuring 51 mm.  11. The inferior vena cava is dilated in size with >50% respiratory  variability, suggesting right atrial pressure of 8 mmHg.   Comparison(s): Further dilation of the ascending aorta.   Conclusion(s)/Recommendation(s): Has recent CT Aorta that correlates with  similar aortic size.   ASSESSMENT AND PLAN:  1.  CHF. Chronic systolic. EF 35-40%.  he is   well compensated on aldactone , Coreg , and Entresto . Also on Jardiance . Suspect LV dysfunction may be  related to history of Etoh abuse. Congratulated on cessation.  Sodium restriction.  2. Atrial fibrillation- persistent/permanent. HR well controlled.  Continue Coreg  and Xarelto . Labs ok 3. CAD. Based on heavy coronary calcification on CT. No active angina.  4. HLD. On statin with excellent control. LDL 58 5. DM on insulin  per primary care. On metformin  and Jardiance . Last A1c 6.6%.  6. Asthma/COPD. on inhalers 7. CKD stage 3. Last creatinine 1.02 8. HTN well controlled.  9. Thoracic aortic aneurysm. 4.9 cm. Will need yearly CT. SABRA  Recommend avoidance of fluoroquinolone antibiotics. Followed by Dr Shyrl.  10. History of Etoh abuse. Cirrhosis noted on CT. Recommend abstinence.      Current medicines are reviewed at length with the patient today.  The patient does not have concerns regarding medicines.  The following changes have been made:  no change  Labs/ tests ordered today include:   No orders of the defined types were placed in this encounter.    Disposition:   FU  in 6 months  Signed, Elfrieda Espino, MD  08/18/2024 9:04 AM    [facility] [address], [place], [place], [address] Phone [phone], Fax [phone]

## 2024-08-23 ENCOUNTER — Other Ambulatory Visit: Payer: Self-pay

## 2024-08-23 ENCOUNTER — Emergency Department (HOSPITAL_COMMUNITY)
Admission: EM | Admit: 2024-08-23 | Discharge: 2024-08-23 | Disposition: A | Discharge status: Home / Self Care | Attending: Emergency Medicine | Admitting: Emergency Medicine

## 2024-08-23 ENCOUNTER — Emergency Department (HOSPITAL_COMMUNITY)
Admission: EM | Admit: 2024-08-23 | Discharge: 2024-08-23 | Discharge status: Against Medical Advice | Attending: Emergency Medicine | Admitting: Emergency Medicine

## 2024-08-23 ENCOUNTER — Encounter (HOSPITAL_COMMUNITY): Payer: Self-pay

## 2024-08-23 DIAGNOSIS — E1165 Type 2 diabetes mellitus with hyperglycemia: Secondary | ICD-10-CM | POA: Insufficient documentation

## 2024-08-23 DIAGNOSIS — J449 Chronic obstructive pulmonary disease, unspecified: Secondary | ICD-10-CM | POA: Insufficient documentation

## 2024-08-23 DIAGNOSIS — I10 Essential (primary) hypertension: Secondary | ICD-10-CM | POA: Insufficient documentation

## 2024-08-23 DIAGNOSIS — Z794 Long term (current) use of insulin: Secondary | ICD-10-CM | POA: Insufficient documentation

## 2024-08-23 DIAGNOSIS — Z79899 Other long term (current) drug therapy: Secondary | ICD-10-CM | POA: Insufficient documentation

## 2024-08-23 DIAGNOSIS — Z7984 Long term (current) use of oral hypoglycemic drugs: Secondary | ICD-10-CM | POA: Insufficient documentation

## 2024-08-23 DIAGNOSIS — R739 Hyperglycemia, unspecified: Secondary | ICD-10-CM

## 2024-08-23 LAB — CBC WITH DIFFERENTIAL/PLATELET
Abs Immature Granulocytes: 0.01 K/uL (ref 0.00–0.07)
Basophils Absolute: 0 K/uL (ref 0.0–0.1)
Basophils Relative: 0 %
Eosinophils Absolute: 0.1 K/uL (ref 0.0–0.5)
Eosinophils Relative: 2 %
HCT: 43.2 % (ref 39.0–52.0)
Hemoglobin: 14.6 g/dL (ref 13.0–17.0)
Immature Granulocytes: 0 %
Lymphocytes Relative: 27 %
Lymphs Abs: 1.3 K/uL (ref 0.7–4.0)
MCH: 31 pg (ref 26.0–34.0)
MCHC: 33.8 g/dL (ref 30.0–36.0)
MCV: 91.7 fL (ref 80.0–100.0)
Monocytes Absolute: 0.3 K/uL (ref 0.1–1.0)
Monocytes Relative: 7 %
Neutro Abs: 3 K/uL (ref 1.7–7.7)
Neutrophils Relative %: 64 %
Platelets: 162 K/uL (ref 150–400)
RBC: 4.71 MIL/uL (ref 4.22–5.81)
RDW: 13.2 % (ref 11.5–15.5)
WBC: 4.8 K/uL (ref 4.0–10.5)
nRBC: 0 % (ref 0.0–0.2)

## 2024-08-23 LAB — URINALYSIS, ROUTINE W REFLEX MICROSCOPIC
Bacteria, UA: NONE SEEN
Bilirubin Urine: NEGATIVE
Glucose, UA: >=500 mg/dL — AB
Hgb urine dipstick: NEGATIVE
Ketones, ur: NEGATIVE mg/dL
Leukocytes,Ua: NEGATIVE
Nitrite: NEGATIVE
Protein, ur: NEGATIVE mg/dL
Specific Gravity, Urine: 1.026 (ref 1.005–1.030)
pH: 7 (ref 5.0–8.0)

## 2024-08-23 LAB — BLOOD GAS, VENOUS
Acid-Base Excess: 8.7 mmol/L — ABNORMAL HIGH (ref 0.0–2.0)
Bicarbonate: 35.5 mmol/L — ABNORMAL HIGH (ref 20.0–28.0)
O2 Saturation: 53.9 %
Patient temperature: 37
pCO2, Ven: 56 mmHg (ref 44–60)
pH, Ven: 7.41 (ref 7.25–7.43)
pO2, Ven: 32 mmHg (ref 32–45)

## 2024-08-23 LAB — CBG MONITORING, ED
Glucose-Capillary: 91 mg/dL (ref 70–99)
Glucose-Capillary: 382 mg/dL — ABNORMAL HIGH (ref 70–99)
Glucose-Capillary: 206 mg/dL — ABNORMAL HIGH (ref 70–99)

## 2024-08-23 LAB — COMPREHENSIVE METABOLIC PANEL WITH GFR
ALT: 19 U/L (ref 0–44)
AST: 34 U/L (ref 15–41)
Albumin: 4.3 g/dL (ref 3.5–5.0)
Alkaline Phosphatase: 31 U/L — ABNORMAL LOW (ref 38–126)
Anion gap: 8 (ref 5–15)
BUN: 24 mg/dL — ABNORMAL HIGH (ref 8–23)
CO2: 33 mmol/L — ABNORMAL HIGH (ref 22–32)
Calcium: 9.4 mg/dL (ref 8.9–10.3)
Chloride: 93 mmol/L — ABNORMAL LOW (ref 98–111)
Creatinine, Ser: 1.09 mg/dL (ref 0.61–1.24)
GFR, Estimated: >60 mL/min (ref >60)
Glucose, Bld: 419 mg/dL — ABNORMAL HIGH (ref 70–99)
Potassium: 4.6 mmol/L (ref 3.5–5.1)
Sodium: 134 mmol/L — ABNORMAL LOW (ref 135–145)
Total Bilirubin: 0.5 mg/dL (ref 0.0–1.2)
Total Protein: 7 g/dL (ref 6.5–8.1)

## 2024-08-23 LAB — BETA-HYDROXYBUTYRIC ACID: Beta-Hydroxybutyric Acid: 0.12 mmol/L (ref 0.05–0.27)

## 2024-08-23 MED ORDER — LACTATED RINGERS IV BOLUS
1000.0000 mL | Freq: Once | INTRAVENOUS | Status: AC
  Administered 2024-08-23: 1000 mL via INTRAVENOUS

## 2024-08-23 NOTE — ED Triage Notes (Signed)
 Patient c/o hypoglycemia 50-60 started at 8pm last night. Patient states his been trying to eat honey at home. Patient report slight dizziness. Patient denies N/V.

## 2024-08-23 NOTE — ED Notes (Signed)
 Patient Glucometer went up from 60 to 80. Patient states he just want togo home and go to sleep.

## 2024-08-23 NOTE — ED Provider Notes (Signed)
 [place] EMERGENCY DEPARTMENT AT [facility] Provider Note   CSN: [id number] Arrival date & time: 08/23/24  1012     Patient presents with: Hyperglycemia   Theodore Bush is a 73 y.o. male with history of A-fib, diabetes, hypertension, hyperlipidemia, COPD presents to the emerged from today for evaluation of hyperglycemia.  Patient reports he initially checked in of the ER last night for hypoglycemia however was trying to eat very sugary things to bring his sugar up and now his sugar is too high.  He reports that last night he did not eat dinner and was drinking alcohol.  He woke up around 0300 today with an alert from his CBG showing his blood sugar of 50.  He reports that he ate a bunch of sugar and it started to come up so he left the ER.  Now has been checking his sugar and has been greater than 400 reading high on his monitor.  He reports that he usually uses 20 units of Lantus  in the morning however gave himself 30 units.  He reports that he felt maybe a little lightheaded when taking his blood sugar but was quick lived and now feels at his baseline.  Now he is worried that his blood sugar is too high.  He denies any chest pain, shortness of breath, nausea, vomiting, fever, or recent illness.   Hyperglycemia Associated symptoms: no abdominal pain, no chest pain, no dysuria, no fever, no nausea, no shortness of breath and no vomiting        Prior to Admission medications   Medication Sig Start Date End Date Taking? Authorizing Provider  ACCU-CHEK GUIDE test strip USE TO TEST 1 TO 2 TIMES PER DAY 06/06/22   Tysinger, Alm RAMAN, PA-C  Accu-Chek Softclix Lancets lancets USE TO TEST 1 TO 2 TIMES PER DAY 06/06/22   Tysinger, Alm RAMAN, PA-C  Adalimumab 40 MG/0.4ML PNKT Inject 1 each into the skin. 2 times a months.    [provider]  albuterol  (VENTOLIN  HFA) 108 (90 Base) MCG/ACT inhaler INHALE 1 PUFF INTO THE LUNGS EVERY 6 HOURS AS NEEDED FOR WHEEZING OR SHORTNESS OF BREATH  07/28/24   Tysinger, Alm RAMAN, PA-C  amoxicillin -clavulanate (AUGMENTIN ) 875-125 MG tablet Take 1 tablet by mouth every 12 (twelve) hours. 07/12/24   Laurice Maude BROCKS, MD  atorvastatin  (LIPITOR) 80 MG tablet TAKE 1 TABLET(80 MG) BY MOUTH DAILY 04/22/24   Jordan, Peter M, MD  Blood Glucose Monitoring Suppl (ACCU-CHEK GUIDE ME) w/Device KIT Test 1-2 times daily. Pt needs accu-chek meter. Dx. Ell.9 04/17/22   Tysinger, Alm RAMAN, PA-C  carvedilol  (COREG ) 25 MG tablet TAKE 1 TABLET(25 MG) BY MOUTH TWICE DAILY 07/01/24   Jordan, Peter M, MD  Continuous Glucose Sensor (FREESTYLE LIBRE 3 PLUS SENSOR) MISC 1 each by Does not apply route continuous. Change every 15 days. 08/24/23   Thapa, Sudan, MD  Continuous Glucose Sensor (FREESTYLE LIBRE 3 SENSOR) MISC by Does not apply route.    [provider]  ENTRESTO  24-26 MG TAKE 1 TABLET BY MOUTH TWICE DAILY 03/31/24   Jordan, Peter M, MD  Fenofibrate  50 MG CAPS TAKE ONE CAPSULE BY MOUTH DAILY 03/18/24   Tysinger, Alm RAMAN, PA-C  HYDROcodone-acetaminophen (NORCO) 10-325 MG tablet Take 1 tablet by mouth every 6 (six) hours as needed.    [provider]  insulin  glargine (LANTUS ) 100 UNIT/ML injection Inject 100 Units into the skin daily.    [provider]  Insulin  Pen Needle (PEN NEEDLES)  32G X 4 MM MISC 1 each by Other route daily. 08/22/19   Henson, Vickie L, NP-C  JARDIANCE  25 MG TABS tablet TAKE 1 TABLET(25 MG) BY MOUTH DAILY 05/06/24   Thapa, Sudan, MD  metFORMIN  (GLUCOPHAGE -XR) 500 MG 24 hr tablet TAKE 4 TABLETS(2000 MG) BY MOUTH DAILY WITH BREAKFAST 06/17/24   Tysinger, Alm RAMAN, PA-C  montelukast  (SINGULAIR ) 10 MG tablet TAKE 1 TABLET(10 MG) BY MOUTH AT BEDTIME 03/25/24   Tysinger, Alm RAMAN, PA-C  Multiple Vitamin (MULTIVITAMIN WITH MINERALS) TABS tablet Take 1 tablet by mouth daily.    [provider]  repaglinide  (PRANDIN ) 1 MG tablet Take 1 tablet (1 mg total) by mouth 3 (three) times daily before meals. 07/16/24   Thapa, Sudan, MD   spironolactone  (ALDACTONE ) 25 MG tablet TAKE 1 TABLET(25 MG) BY MOUTH EVERY OTHER DAY 03/25/24   Tysinger, Alm RAMAN, PA-C  SYMBICORT  160-4.5 MCG/ACT inhaler INHALE 2 PUFFS INTO THE LUNGS TWICE DAILY 06/30/24   Tysinger, Alm RAMAN, PA-C  traZODone  (DESYREL ) 100 MG tablet Take 1 tablet (100 mg total) by mouth at bedtime. 08/12/24   Tysinger, Alm RAMAN, PA-C  TRESIBA  FLEXTOUCH 100 UNIT/ML FlexTouch Pen ADMINISTER 20 UNITS UNDER THE SKIN DAILY 06/09/24   Thapa, Sudan, MD  XARELTO  20 MG TABS tablet TAKE ONE TABLET BY MOUTH DAILY 07/01/24   Jordan, Peter M, MD    Allergies: Wellbutrin  [bupropion ] and Quinolones    Review of Systems  Constitutional:  Negative for chills and fever.  HENT:  Negative for congestion and rhinorrhea.   Respiratory:  Negative for shortness of breath.   Cardiovascular:  Negative for chest pain.  Gastrointestinal:  Negative for abdominal pain, diarrhea, nausea and vomiting.  Genitourinary:  Negative for dysuria.    Updated Vital Signs BP (!) 155/86 (BP Location: Right Arm)   Pulse 75   Temp 98.2 F (36.8 C) (Oral)   Resp 18   SpO2 100%   Physical Exam Vitals and nursing note reviewed.  Constitutional:      General: He is not in acute distress.    Appearance: He is not ill-appearing or toxic-appearing.  Eyes:     General: No scleral icterus. Cardiovascular:     Rate and Rhythm: Normal rate.  Pulmonary:     Effort: Pulmonary effort is normal. No respiratory distress.  Abdominal:     Palpations: Abdomen is soft.     Tenderness: There is no abdominal tenderness. There is no guarding or rebound.  Skin:    General: Skin is warm and dry.  Neurological:     Mental Status: He is alert.     (all labs ordered are listed, but only abnormal results are displayed) Labs Reviewed  COMPREHENSIVE METABOLIC PANEL WITH GFR - Abnormal; Notable for the following components:      Result Value   Sodium 134 (*)    Chloride 93 (*)    CO2 33 (*)    Glucose, Bld 419 (*)    BUN  24 (*)    Alkaline Phosphatase 31 (*)    All other components within normal limits  BLOOD GAS, VENOUS - Abnormal; Notable for the following components:   Bicarbonate 35.5 (*)    Acid-Base Excess 8.7 (*)    All other components within normal limits  URINALYSIS, ROUTINE W REFLEX MICROSCOPIC - Abnormal; Notable for the following components:   Glucose, UA >=500 (*)    All other components within normal limits  CBG MONITORING, ED - Abnormal; Notable for the following components:  Glucose-Capillary 382 (*)    All other components within normal limits  CBC WITH DIFFERENTIAL/PLATELET  BETA-HYDROXYBUTYRIC ACID    EKG: None  Radiology: No results found.  Procedures   Medications Ordered in the ED - No data to display  Medical Decision Making Amount and/or Complexity of Data Reviewed Labs: ordered.   73 y.o. male presents to the ER for evaluation of hyperglycemia. Differential diagnosis includes but is not limited to hyperglycemia, DKA, HHS. Vital signs mildly elevated blood pressure otherwise unremarkable. Physical exam as noted above.   Patient has no complaints other than elevated blood sugar.  He feels at his baseline of health.  Vital signs show only mildly elevated blood pressure otherwise unremarkable.  Will check basic labs.   Elevated CBG, will give him 1L bolus given his recent administration increase in Lantus .  I independently reviewed and interpreted the patient's labs.  Urinalysis shows greater than 500 Leukos present but no ketones.  Otherwise unremarkable.  CBC without leukocytosis or anemia.  CMP shows a sodium 134 however likely pseudohyponatremia given glucose of 419.  Chloride at 93 with a bicarb of 33, baseline level.  BUN of 24.  Alk phos of 31.  Otherwise no electrolyte or LFT abnormality.  I-STAT VBG shows a pH of 7.41.  Beta-hydroxybutyrate acid within normal limits.  Patient's CBG has improved to 206. He reports that he feels at his baseline of health still. He  would like to go home. I think this is reasonable.  I discussed with the patient given that he was given long-acting insulin  to make sure that he is checking his blood sugar multiple times and that he is eating regularly.  Lab work not consistent with HHS or DKA.  Likely some hyper glycemia from eating too many carbs last night and episode of being low from not eating.  He is stable for discharge home with strict return precautions.  We discussed the results of the labs/imaging. The plan is continue to monitor blood sugar, strict return precautions. We discussed strict return precautions and red flag symptoms. The patient verbalized their understanding and agrees to the plan. The patient is stable and being discharged home in good condition.  Portions of this report may have been transcribed using voice recognition software. Every effort was made to ensure accuracy; however, inadvertent computerized transcription errors may be present.  Final diagnoses:  Hyperglycemia    ED Discharge Orders     None          Bernis Ernst, [place] 08/23/24 1513    Francesca Elsie CROME, MD 08/24/24 [phone]

## 2024-08-23 NOTE — Discharge Instructions (Signed)
 Remember you gave yourself long acting insulin  this morning. Please keep a careful eye on your blood sugars this evening and tonight! Make sure that you are eating regular meals and staying hydrated. If you have any concerns, new or worsening symptoms, please return to the nearest ER for re-evaluation.   Contact a health care provider if: You have diabetes and have trouble keeping your blood sugar in the right range. Your blood sugar is at or above 240 mg/dL (86.6 mmol/L) for 2 days in a row. You have high blood sugar often. You have signs of illness, such as: Nausea or vomiting. A headache. A fever. You can't stop vomiting. Get help right away if: Your blood sugar monitor reads high even when you're taking insulin . You have trouble breathing. These symptoms may be an emergency. Call 911 right away. Do not wait to see if the symptoms will go away. Do not drive yourself to the hospital.

## 2024-08-23 NOTE — ED Triage Notes (Signed)
 Patient reports he was evaluated last night for low blood sugar in the 50's Ate a lot of sweets and this morning glucose was 400 Took medications but glucose still elevated Reports he felt dizzy earlier but is fine now Alert/oriented/ambulatory in triage

## 2024-08-25 ENCOUNTER — Ambulatory Visit: Admitting: Cardiology

## 2024-09-08 ENCOUNTER — Telehealth: Payer: Self-pay | Admitting: Medical

## 2024-09-08 NOTE — Telephone Encounter (Unsigned)
 Copied from CRM #[id number]. Topic: Clinical - Medication Refill >> Sep 08, 2024  1:07 PM Hadassah PARAS wrote: Medication: carvedilol  (COREG ) 25 MG tablet   Has the patient contacted their pharmacy? No (Agent: If no, request that the patient contact the pharmacy for the refill. If patient does not wish to contact the pharmacy document the reason why and proceed with request.) (Agent: If yes, when and what did the pharmacy advise?)  This is the patient's preferred pharmacy:   [facility] Address: [address], [place] [address] Phone: [phone]  Is this the correct pharmacy for this prescription? Yes If no, delete pharmacy and type the correct one.   Has the prescription been filled recently? No  Is the patient out of the medication? No  Has the patient been seen for an appointment in the last year OR does the patient have an upcoming appointment? Yes  Can we respond through MyChart? Yes  Agent: Please be advised that Rx refills may take up to 3 business days. We ask that you follow-up with your pharmacy.

## 2024-09-09 MED ORDER — CARVEDILOL 25 MG PO TABS: ORAL_TABLET | ORAL | 0 refills | Status: AC

## 2024-09-17 ENCOUNTER — Other Ambulatory Visit: Payer: Self-pay

## 2024-09-17 MED ORDER — FREESTYLE LIBRE 3 PLUS SENSOR MISC: 0 refills | Status: AC

## 2024-09-17 NOTE — Telephone Encounter (Signed)
 Pt called requesting for 1 sensor to be sent the pharmacy while he is out of town   Requested Prescriptions   Signed Prescriptions Disp Refills   Continuous Glucose Sensor (FREESTYLE LIBRE 3 PLUS SENSOR) MISC 1 each 0    Sig: Change every 15 days.    Authorizing Provider: TRIXIE FILE    Ordering User: CLEOTILDE ROLIN RAMAN

## 2024-09-21 ENCOUNTER — Other Ambulatory Visit: Payer: Self-pay | Admitting: Medical

## 2024-09-22 ENCOUNTER — Other Ambulatory Visit: Payer: Self-pay | Admitting: Medical

## 2024-09-22 NOTE — Telephone Encounter (Signed)
"  Last appt 05/27/24  "

## 2024-09-22 NOTE — Telephone Encounter (Signed)
 Is this okay to refill?

## 2024-09-26 ENCOUNTER — Telehealth: Payer: Self-pay | Admitting: Medical

## 2024-09-26 NOTE — Telephone Encounter (Signed)
 Copied from CRM #[id number]. Topic: Clinical - Medication Question >> Sep 26, 2024 10:57 AM Antwanette L wrote: Reason for CRM: Patient is calling to request a new order for Simlandi.The patient was previously using Humira, but the insurance provider is requiring an alternative medication. I didn't see the medication listed in the patient chart.Patient request that the prescription be sent to [facility]. Patient can be reached via Mychart and by phone at [phone]

## 2024-09-29 NOTE — Telephone Encounter (Signed)
 Pt will reach out to rheumatology.

## 2024-10-01 ENCOUNTER — Ambulatory Visit: Admitting: Endocrinology

## 2024-10-06 ENCOUNTER — Ambulatory Visit: Payer: Self-pay | Admitting: Endocrinology

## 2024-10-06 ENCOUNTER — Encounter: Payer: Self-pay | Admitting: Endocrinology

## 2024-10-06 ENCOUNTER — Ambulatory Visit (INDEPENDENT_AMBULATORY_CARE_PROVIDER_SITE_OTHER): Admitting: Endocrinology

## 2024-10-06 VITALS — BP 138/90 | HR 76 | Resp 16 | Ht 68.0 in | Wt 183.0 lb

## 2024-10-06 DIAGNOSIS — Z794 Long term (current) use of insulin: Secondary | ICD-10-CM

## 2024-10-06 DIAGNOSIS — E1165 Type 2 diabetes mellitus with hyperglycemia: Secondary | ICD-10-CM | POA: Diagnosis not present

## 2024-10-06 LAB — POCT GLYCOSYLATED HEMOGLOBIN (HGB A1C): Hemoglobin A1C: 7.6 % — AB (ref 4.0–5.6)

## 2024-10-06 NOTE — Progress Notes (Signed)
 "  Outpatient Endocrinology Note [name], MD  10/06/24  Patient's Name: Theodore Bush    DOB: 1951-04-23    MRN: [id number]                                                    REASON OF VISIT: Follow up for type 2 diabetes mellitus  PCP: Bulah Alm RAMAN, PA-C  HISTORY OF PRESENT ILLNESS:   Theodore Bush is a 74 y.o. old male with past medical history listed below, is here for follow up for type 2 diabetes mellitus.   Pertinent Diabetes History: Patient was previously seen by Dr. Von and was last time seen in May 2024.  Patient was diagnosed with type 2 diabetes mellitus in 1995.  He has been on insulin  therapy since 2018.  Chronic Diabetes Complications : Retinopathy: yes, moderate non proliferative diabetic retinopathy, following with ophthalmology regularly. Every 6 months, Dr. Octavia.  Nephropathy: Microalbuminuria, on Jardiance  /valsartan. Peripheral neuropathy: no Coronary artery disease: yes Stroke: no  Relevant comorbidities and cardiovascular risk factors: Obesity: no Body mass index is 27.83 kg/m.  Hypertension: Yes  Hyperlipidemia : Yes, on statin   Current / Home Diabetic regimen includes:  Non-insulin  hypoglycemic drugs:  metformin  XR 2000 mg daily, Jardiance  25 mg daily.  Prandin  /repaglinide  1 mg with meals three times a day.  Prandin  was started in May 2024.     Insulin  regimen: Tresiba  20 units once a day  in the morning.      Prior diabetic medications: Jardiance  was started in 2018.  He had been on Januvia, linagliptin in the past.  He had nausea and vomiting with 5 mg of Mounjaro  and Ozempic  0.25 mg weekly.  He reported significant weight loss with Ozempic .  Glycemic data:    CONTINUOUS GLUCOSE MONITORING SYSTEM (CGMS) INTERPRETATION: At today's visit, we reviewed CGM downloads. The full report is scanned in the media. Reviewing the CGM trends, blood glucose are as follows:  FreeStyle Libre 3+ CGM-  Sensor Download (Sensor download was reviewed  and summarized below.) Dates: January 6 to October 06, 2024, 14 days Glucose Management Indicator: 7.5%  % data captured: 90%  Glucose data is scanned into media.   Interpretation: Random hyperglycemia related to high carb meal with blood sugar up to 250-300 range.  Otherwise mostly acceptable blood sugar.  1 episode of mild hypoglycemia and blood sugar in 60s in the early morning related to skipping meals in last 2 weeks.  No concerning hypoglycemia.  Hypoglycemia: Patient has one hypoglycemic episode. Patient has hypoglycemia awareness.  Factors modifying glucose control: 1.  Diabetic diet assessment: 3 meals a day.  2.  Staying active or exercising: Busy at work, is still working in holiday representative.  3.  Medication compliance: compliant all of the time.  Interval history  Patient was traveling in last 2 months different places in [place] , reports did not have a great diet.  CGM data as reviewed above.  Hemoglobin A1c mildly worsened to 7.6%.  Patient complains of cough on and off for last 2 months.  He has been trying over-the-counter cough medication.  He returned to home town few days ago.  Discussed about trying over-the-counter cough medication and if not relieved in 3 weeks asked to discuss with primary care provider.  No other complaints today.  REVIEW OF SYSTEMS As per history  of present illness.   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   Arthritis    Asthma    Atrial fibrillation (HCC) 09/02/2018   CHF (congestive heart failure) (HCC)    Chronic kidney disease 06/11/2019   Cirrhosis (HCC)    Controlled diabetes mellitus type 2 with complications (HCC) 09/02/2018   Controlled type 2 diabetes mellitus with complication, with long-term current use of insulin  (HCC) 09/02/2018   COPD (chronic obstructive pulmonary disease) (HCC)    Coronary artery calcification 06/11/2019   Hyperlipidemia     Hypertension    Insomnia 06/11/2019   Pneumonia 2019   Sleep apnea    he was 280lb at the time   Staph infection    to elbow 15-91yrs ago   Type 2 diabetes mellitus with hyperglycemia, without long-term current use of insulin  (HCC) 02/03/2022    PAST SURGICAL HISTORY: Past Surgical History:  Procedure Laterality Date   COLONOSCOPY  12/2021   EYE SURGERY     PALATE / UVULA BIOPSY / EXCISION     trimmed several times    ALLERGIES: Allergies  Allergen Reactions   Wellbutrin  [Bupropion ]     intolerance   Quinolones Other (See Comments)    FAMILY HISTORY:  Family History  Problem Relation Age of Onset   Lupus Mother    Hypertension Mother    Diabetes Father    Diabetes Paternal Grandmother    Colon cancer Neg Hx    Stomach cancer Neg Hx    Esophageal cancer Neg Hx    Rectal cancer Neg Hx     SOCIAL HISTORY: Social History   Socioeconomic History   Marital status: Married    Spouse name: Not on file   Number of children: 2   Years of education: Not on file   Highest education level: Not on file  Occupational History   Occupation: Retired    Comment: Civil service fast streamer  Tobacco Use   Smoking status: Never   Smokeless tobacco: Current    Types: Snuff  Vaping Use   Vaping status: Never Used  Substance and Sexual Activity   Alcohol use: Yes    Alcohol/week: 10.0 standard drinks of alcohol    Types: 10 Shots of liquor per week    Comment: 10 drinks a week   Drug use: Yes    Types: Hydrocodone    Comment: occ, once a week per patient.   Sexual activity: Not Currently  Other Topics Concern   Not on file  Social History Narrative   Lives with wife Tammy.   Son and a daughter in [place].  Has several grandchildren.   Pipe fitter, still works.   11/2023   Social Drivers of Health   Tobacco Use: High Risk (10/06/2024)   Patient History    Smoking Tobacco Use: Never    Smokeless Tobacco Use: Current    Passive Exposure: Not on file  Financial Resource  Strain: Low Risk (07/22/2024)   Overall Financial Resource Strain (CARDIA)    Difficulty of Paying Living Expenses: Not hard at all  Food Insecurity: No Food Insecurity (07/22/2024)   Epic    Worried About Programme Researcher, Broadcasting/film/video in the Last Year: Never true    Ran Out of Food in the Last Year: Never true  Transportation Needs: No Transportation Needs (07/22/2024)   Epic    Lack of Transportation (Medical): No    Lack of Transportation (Non-Medical):  No  Physical Activity: Insufficiently Active (07/22/2024)   Exercise Vital Sign    Days of Exercise per Week: 6 days    Minutes of Exercise per Session: 20 min  Stress: Stress Concern Present (07/22/2024)   [employer] of Occupational Health - Occupational Stress Questionnaire    Feeling of Stress: Rather much  Social Connections: Moderately Isolated (07/22/2024)   Social Connection and Isolation Panel    Frequency of Communication with Friends and Family: More than three times a week    Frequency of Social Gatherings with Friends and Family: Twice a week    Attends Religious Services: Patient declined    Database Administrator or Organizations: No    Attends Banker Meetings: Never    Marital Status: Married  Depression (PHQ2-9): Low Risk (07/24/2024)   Depression (PHQ2-9)    PHQ-2 Score: 0  Alcohol Screen: Low Risk (07/22/2024)   Alcohol Screen    Last Alcohol Screening Score (AUDIT): 2  Housing: Low Risk (07/22/2024)   Epic    Unable to Pay for Housing in the Last Year: No    Number of Times Moved in the Last Year: 0    Homeless in the Last Year: No  Utilities: Not At Risk (07/24/2024)   Epic    Threatened with loss of utilities: No  Health Literacy: Adequate Health Literacy (07/24/2024)   B1300 Health Literacy    Frequency of need for help with medical instructions: Never    MEDICATIONS:  Current Outpatient Medications  Medication Sig Dispense Refill   ACCU-CHEK GUIDE test strip USE TO TEST 1 TO 2 TIMES PER DAY 100  strip 3   Accu-Chek Softclix Lancets lancets USE TO TEST 1 TO 2 TIMES PER DAY 100 each 3   Adalimumab 40 MG/0.4ML PNKT Inject 1 each into the skin. 2 times a months.     albuterol  (VENTOLIN  HFA) 108 (90 Base) MCG/ACT inhaler INHALE 1 PUFF INTO THE LUNGS EVERY 6 HOURS AS NEEDED FOR WHEEZING OR SHORTNESS OF BREATH 18 g 1   atorvastatin  (LIPITOR) 80 MG tablet TAKE 1 TABLET(80 MG) BY MOUTH DAILY 90 tablet 3   Blood Glucose Monitoring Suppl (ACCU-CHEK GUIDE ME) w/Device KIT Test 1-2 times daily. Pt needs accu-chek meter. Dx. Ell.9 1 kit 0   carvedilol  (COREG ) 25 MG tablet TAKE 1 TABLET(25 MG) BY MOUTH TWICE DAILY 180 tablet 0   Continuous Glucose Sensor (FREESTYLE LIBRE 3 PLUS SENSOR) MISC Change every 15 days. 1 each 0   ENTRESTO  24-26 MG TAKE 1 TABLET BY MOUTH TWICE DAILY 180 tablet 3   Fenofibrate  50 MG CAPS TAKE ONE CAPSULE BY MOUTH DAILY 90 capsule 2   HYDROcodone-acetaminophen (NORCO) 10-325 MG tablet Take 1 tablet by mouth every 6 (six) hours as needed.     insulin  glargine (LANTUS ) 100 UNIT/ML injection Inject 100 Units into the skin daily.     Insulin  Pen Needle (PEN NEEDLES) 32G X 4 MM MISC 1 each by Other route daily. 100 each 2   JARDIANCE  25 MG TABS tablet TAKE 1 TABLET(25 MG) BY MOUTH DAILY 90 tablet 3   metFORMIN  (GLUCOPHAGE -XR) 500 MG 24 hr tablet TAKE 4 TABLETS(2000 MG) BY MOUTH DAILY WITH BREAKFAST 120 tablet 5   montelukast  (SINGULAIR ) 10 MG tablet TAKE 1 TABLET(10 MG) BY MOUTH AT BEDTIME 90 tablet 1   Multiple Vitamin (MULTIVITAMIN WITH MINERALS) TABS tablet Take 1 tablet by mouth daily.     repaglinide  (PRANDIN ) 1 MG tablet Take 1 tablet (1 mg  total) by mouth 3 (three) times daily before meals. 270 tablet 3   spironolactone  (ALDACTONE ) 25 MG tablet TAKE 1 TABLET(25 MG) BY MOUTH EVERY OTHER DAY 45 tablet 1   SYMBICORT  160-4.5 MCG/ACT inhaler INHALE 2 PUFFS INTO THE LUNGS TWICE DAILY 10.2 g 2   traZODone  (DESYREL ) 100 MG tablet TAKE 1 TABLET(100 MG) BY MOUTH AT BEDTIME 30 tablet 0    TRESIBA  FLEXTOUCH 100 UNIT/ML FlexTouch Pen ADMINISTER 20 UNITS UNDER THE SKIN DAILY 15 mL 3   XARELTO  20 MG TABS tablet TAKE ONE TABLET BY MOUTH DAILY 90 tablet 1   No current facility-administered medications for this visit.    PHYSICAL EXAM: Vitals:   10/06/24 1044 10/06/24 1045  BP: (!) 138/96 (!) 138/90  Pulse: 76   Resp: 16   SpO2: 97%   Weight: 183 lb (83 kg)   Height: 5' 8 (1.727 m)    Body mass index is 27.83 kg/m.  Wt Readings from Last 3 Encounters:  10/06/24 183 lb (83 kg)  07/24/24 185 lb (83.9 kg)  07/12/24 185 lb (83.9 kg)    General: Well developed, well nourished male in no apparent distress.  HEENT: AT/[place], no external lesions.  Eyes: Conjunctiva clear and no icterus. Neck: Neck supple  Lungs: Respirations not labored Neurologic: Alert, oriented, normal speech Extremities / Skin: Dry.  Psychiatric: Does not appear depressed or anxious  Diabetic Foot Exam - Simple   No data filed     LABS Reviewed Lab Results  Component Value Date   HGBA1C 7.6 (A) 10/06/2024   HGBA1C 7.0 (A) 05/02/2024   HGBA1C 6.6 (A) 12/28/2023   No results found for: FRUCTOSAMINE Lab Results  Component Value Date   CHOL 130 07/13/2023   HDL 59 07/13/2023   LDLCALC 58 07/13/2023   LDLDIRECT 51.0 06/02/2022   TRIG 62 07/13/2023   CHOLHDL 2.2 07/13/2023   Lab Results  Component Value Date   MICRALBCREAT 200 (H) 05/02/2024   MICRALBCREAT 709 (H) 12/14/2023   Lab Results  Component Value Date   CREATININE 1.09 08/23/2024   Lab Results  Component Value Date   GFR 67.20 06/10/2024    ASSESSMENT / PLAN  1. Type 2 diabetes mellitus with hyperglycemia, with long-term current use of insulin  (HCC)     Diabetes Mellitus type 2, complicated by diabetic retinopathy /microalbuminuria. - Diabetic status / severity: uncontrolled.  Lab Results  Component Value Date   HGBA1C 7.6 (A) 10/06/2024    - Hemoglobin A1c goal : <6.5%  Discussed about limiting  carbohydrate to avoid postprandial hyperglycemia.  Mild worsening of diabetes control related to his recent traveling to different places.  Expect to improve.  Discussed about decreasing Tresiba  to 18 units if he has blood sugar persistently below 100 range in the morning fasting.  - Medications: See below.  No change today.  I) continue Tresiba  20 units daily. II) continue Jardiance  25 mg daily. III) continue metformin  extended release 2000 mg daily. IV) take Prandin  1 mg as needed with largest meal only.  Do not take in case of increased physical activity or work, to prevent hypoglycemia.  - Home glucose testing: CGM and check as needed.  - Discussed/ Gave Hypoglycemia treatment plan.  # Consult : not required at this time.   # Annual urine for microalbuminuria/ creatinine ratio, + microalbuminuria currently, continue ACE/ARB /valsartan SGLT 2 inhibitor. Last  Lab Results  Component Value Date   MICRALBCREAT 200 (H) 05/02/2024    # Foot check nightly.  #  Patient has diabetic retinopathy, follow-up with ophthalmology.  - Diet: Make healthy diabetic food choices - Life style / activity / exercise: Discussed.  2. Blood pressure  -  BP Readings from Last 1 Encounters:  10/06/24 (!) 138/90    - Control is in target.  - No change in current plans.  3. Lipid status / Hyperlipidemia - Last  Lab Results  Component Value Date   LDLCALC 58 07/13/2023   - Continue atorvastatin  80 mg daily.  On fenofibrate .  Diagnoses and all orders for this visit:  Type 2 diabetes mellitus with hyperglycemia, with long-term current use of insulin  (HCC) -     POCT glycosylated hemoglobin (Hb A1C)    DISPOSITION Follow up in clinic in 4 months suggested.  Labs on same day of visit.   All questions answered and patient verbalized understanding of the plan.  Sallee Hogrefe, MD [facility] [address] [place], [address] [place], [place] [address] Phone #  [phone]  At least part of this note was generated using voice recognition software. Inadvertent word errors may have occurred, which were not recognized during the proofreading process. "

## 2024-10-08 ENCOUNTER — Other Ambulatory Visit: Payer: Self-pay | Admitting: Medical

## 2024-10-08 DIAGNOSIS — J4540 Moderate persistent asthma, uncomplicated: Secondary | ICD-10-CM

## 2024-10-08 NOTE — Telephone Encounter (Signed)
 Has as an appt in April

## 2024-10-14 ENCOUNTER — Encounter (HOSPITAL_BASED_OUTPATIENT_CLINIC_OR_DEPARTMENT_OTHER): Payer: Self-pay

## 2024-10-14 ENCOUNTER — Other Ambulatory Visit (HOSPITAL_BASED_OUTPATIENT_CLINIC_OR_DEPARTMENT_OTHER)

## 2024-10-20 ENCOUNTER — Other Ambulatory Visit: Payer: Self-pay | Admitting: Medical

## 2024-10-20 NOTE — Telephone Encounter (Signed)
"  Last appt 05/27/24  "

## 2024-10-29 ENCOUNTER — Ambulatory Visit: Admitting: Cardiology

## 2024-11-28 ENCOUNTER — Ambulatory Visit: Admitting: Dietician

## 2024-12-26 ENCOUNTER — Ambulatory Visit: Payer: Self-pay | Admitting: Medical

## 2025-02-02 ENCOUNTER — Ambulatory Visit: Admitting: Endocrinology
